# Patient Record
Sex: Female | Born: 1965 | Race: Black or African American | Hispanic: No | Marital: Married | Smoking: Former smoker
Health system: Southern US, Community
[De-identification: ages and names within clinical notes are randomized; demographics above are authoritative.]

## PROBLEM LIST (undated history)

## (undated) DIAGNOSIS — M2240 Chondromalacia patellae, unspecified knee: Secondary | ICD-10-CM

## (undated) DIAGNOSIS — F17200 Nicotine dependence, unspecified, uncomplicated: Secondary | ICD-10-CM

## (undated) DIAGNOSIS — F329 Major depressive disorder, single episode, unspecified: Secondary | ICD-10-CM

## (undated) DIAGNOSIS — F410 Panic disorder [episodic paroxysmal anxiety] without agoraphobia: Secondary | ICD-10-CM

## (undated) DIAGNOSIS — F32A Depression, unspecified: Secondary | ICD-10-CM

## (undated) DIAGNOSIS — L2089 Other atopic dermatitis: Secondary | ICD-10-CM

## (undated) DIAGNOSIS — F319 Bipolar disorder, unspecified: Secondary | ICD-10-CM

## (undated) DIAGNOSIS — N83209 Unspecified ovarian cyst, unspecified side: Secondary | ICD-10-CM

## (undated) DIAGNOSIS — M545 Low back pain: Secondary | ICD-10-CM

## (undated) DIAGNOSIS — F419 Anxiety disorder, unspecified: Secondary | ICD-10-CM

## (undated) DIAGNOSIS — I8390 Asymptomatic varicose veins of unspecified lower extremity: Secondary | ICD-10-CM

## (undated) DIAGNOSIS — M6750 Plica syndrome, unspecified knee: Secondary | ICD-10-CM

## (undated) DIAGNOSIS — M1990 Unspecified osteoarthritis, unspecified site: Secondary | ICD-10-CM

## (undated) DIAGNOSIS — K219 Gastro-esophageal reflux disease without esophagitis: Secondary | ICD-10-CM

## (undated) HISTORY — DX: Low back pain: M54.5

## (undated) HISTORY — DX: Nicotine dependence, unspecified, uncomplicated: F17.200

## (undated) HISTORY — DX: Unspecified ovarian cyst, unspecified side: N83.209

## (undated) HISTORY — DX: Unspecified osteoarthritis, unspecified site: M19.90

## (undated) HISTORY — DX: Chondromalacia patellae, unspecified knee: M22.40

## (undated) HISTORY — DX: Plica syndrome, unspecified knee: M67.50

## (undated) HISTORY — DX: Panic disorder (episodic paroxysmal anxiety): F41.0

## (undated) HISTORY — DX: Asymptomatic varicose veins of unspecified lower extremity: I83.90

## (undated) HISTORY — DX: Gastro-esophageal reflux disease without esophagitis: K21.9

## (undated) HISTORY — DX: Other atopic dermatitis: L20.89

## (undated) NOTE — *Deleted (*Deleted)
It was nice meeting you today!    If you have any questions or concerns, please feel free to call the clinic.   Be well,  Dana Allan, MD [facility]

---

## 1997-10-18 ENCOUNTER — Encounter: Admission: RE | Admit: 1997-10-18 | Discharge: 1997-10-18 | Payer: Self-pay | Admitting: Sports Medicine

## 1997-10-19 ENCOUNTER — Encounter: Admission: RE | Admit: 1997-10-19 | Discharge: 1997-10-19 | Payer: Self-pay | Admitting: Family Medicine

## 1997-10-24 ENCOUNTER — Ambulatory Visit (HOSPITAL_COMMUNITY): Admission: RE | Admit: 1997-10-24 | Discharge: 1997-10-24 | Payer: Self-pay | Admitting: Sports Medicine

## 1997-10-24 ENCOUNTER — Encounter: Admission: RE | Admit: 1997-10-24 | Discharge: 1997-10-24 | Payer: Self-pay | Admitting: Family Medicine

## 1997-10-26 ENCOUNTER — Encounter: Admission: RE | Admit: 1997-10-26 | Discharge: 1997-10-26 | Payer: Self-pay | Admitting: Family Medicine

## 1997-10-28 ENCOUNTER — Ambulatory Visit (HOSPITAL_COMMUNITY): Admission: RE | Admit: 1997-10-28 | Discharge: 1997-10-28 | Payer: Self-pay | Admitting: *Deleted

## 1997-10-28 ENCOUNTER — Encounter: Admission: RE | Admit: 1997-10-28 | Discharge: 1997-10-28 | Payer: Self-pay | Admitting: Family Medicine

## 1997-11-09 ENCOUNTER — Encounter: Admission: RE | Admit: 1997-11-09 | Discharge: 1997-11-09 | Payer: Self-pay | Admitting: Family Medicine

## 1997-11-15 ENCOUNTER — Encounter: Admission: RE | Admit: 1997-11-15 | Discharge: 1997-11-15 | Payer: Self-pay | Admitting: Sports Medicine

## 1997-11-17 ENCOUNTER — Ambulatory Visit (HOSPITAL_COMMUNITY): Admission: RE | Admit: 1997-11-17 | Discharge: 1997-11-17 | Payer: Self-pay | Admitting: Vascular Surgery

## 1997-12-22 ENCOUNTER — Ambulatory Visit (HOSPITAL_COMMUNITY): Admission: RE | Admit: 1997-12-22 | Discharge: 1997-12-22 | Payer: Self-pay | Admitting: Vascular Surgery

## 1997-12-22 ENCOUNTER — Encounter: Payer: Self-pay | Admitting: Vascular Surgery

## 1997-12-28 ENCOUNTER — Encounter: Admission: RE | Admit: 1997-12-28 | Discharge: 1997-12-28 | Payer: Self-pay | Admitting: Family Medicine

## 1998-01-03 ENCOUNTER — Encounter: Admission: RE | Admit: 1998-01-03 | Discharge: 1998-01-03 | Payer: Self-pay | Admitting: Family Medicine

## 1998-01-14 ENCOUNTER — Inpatient Hospital Stay (HOSPITAL_COMMUNITY): Admission: AD | Admit: 1998-01-14 | Discharge: 1998-01-14 | Payer: Self-pay | Admitting: *Deleted

## 1998-01-19 ENCOUNTER — Other Ambulatory Visit: Admission: RE | Admit: 1998-01-19 | Discharge: 1998-01-19 | Payer: Self-pay

## 1998-01-27 ENCOUNTER — Ambulatory Visit (HOSPITAL_COMMUNITY): Admission: RE | Admit: 1998-01-27 | Discharge: 1998-01-27 | Payer: Self-pay

## 1998-02-23 ENCOUNTER — Encounter: Admission: RE | Admit: 1998-02-23 | Discharge: 1998-02-23 | Payer: Self-pay | Admitting: Obstetrics

## 1998-03-08 ENCOUNTER — Encounter: Admission: RE | Admit: 1998-03-08 | Discharge: 1998-03-08 | Payer: Self-pay | Admitting: Obstetrics & Gynecology

## 1998-04-05 ENCOUNTER — Encounter: Admission: RE | Admit: 1998-04-05 | Discharge: 1998-04-05 | Payer: Self-pay | Admitting: Obstetrics & Gynecology

## 1998-04-07 ENCOUNTER — Encounter: Admission: RE | Admit: 1998-04-07 | Discharge: 1998-04-07 | Payer: Self-pay | Admitting: Family Medicine

## 1998-04-17 ENCOUNTER — Ambulatory Visit (HOSPITAL_COMMUNITY): Admission: RE | Admit: 1998-04-17 | Discharge: 1998-04-17 | Payer: Self-pay | Admitting: Obstetrics

## 1998-05-17 ENCOUNTER — Encounter: Admission: RE | Admit: 1998-05-17 | Discharge: 1998-05-17 | Payer: Self-pay | Admitting: Obstetrics & Gynecology

## 1998-05-31 ENCOUNTER — Encounter: Admission: RE | Admit: 1998-05-31 | Discharge: 1998-05-31 | Payer: Self-pay | Admitting: Obstetrics & Gynecology

## 1998-06-13 ENCOUNTER — Inpatient Hospital Stay (HOSPITAL_COMMUNITY): Admission: RE | Admit: 1998-06-13 | Discharge: 1998-06-16 | Payer: Self-pay | Admitting: *Deleted

## 1998-06-19 ENCOUNTER — Encounter (HOSPITAL_COMMUNITY): Admission: RE | Admit: 1998-06-19 | Discharge: 1998-07-31 | Payer: Self-pay | Admitting: Obstetrics & Gynecology

## 1998-06-21 ENCOUNTER — Encounter: Admission: RE | Admit: 1998-06-21 | Discharge: 1998-06-21 | Payer: Self-pay | Admitting: Obstetrics & Gynecology

## 1998-06-21 ENCOUNTER — Inpatient Hospital Stay (HOSPITAL_COMMUNITY): Admission: AD | Admit: 1998-06-21 | Discharge: 1998-06-21 | Payer: Self-pay | Admitting: Obstetrics

## 1998-06-29 ENCOUNTER — Encounter: Admission: RE | Admit: 1998-06-29 | Discharge: 1998-06-29 | Payer: Self-pay | Admitting: Obstetrics

## 1998-07-05 ENCOUNTER — Encounter: Admission: RE | Admit: 1998-07-05 | Discharge: 1998-07-05 | Payer: Self-pay | Admitting: Obstetrics & Gynecology

## 1998-07-12 ENCOUNTER — Encounter: Admission: RE | Admit: 1998-07-12 | Discharge: 1998-07-12 | Payer: Self-pay | Admitting: Obstetrics & Gynecology

## 1998-07-19 ENCOUNTER — Encounter: Admission: RE | Admit: 1998-07-19 | Discharge: 1998-07-19 | Payer: Self-pay | Admitting: Obstetrics & Gynecology

## 1998-07-26 ENCOUNTER — Encounter: Admission: RE | Admit: 1998-07-26 | Discharge: 1998-07-26 | Payer: Self-pay | Admitting: Obstetrics & Gynecology

## 1998-07-30 ENCOUNTER — Inpatient Hospital Stay (HOSPITAL_COMMUNITY): Admission: AD | Admit: 1998-07-30 | Discharge: 1998-08-01 | Payer: Self-pay | Admitting: Obstetrics & Gynecology

## 1998-09-12 ENCOUNTER — Encounter: Admission: RE | Admit: 1998-09-12 | Discharge: 1998-09-12 | Payer: Self-pay | Admitting: Family Medicine

## 1998-09-12 ENCOUNTER — Other Ambulatory Visit: Admission: RE | Admit: 1998-09-12 | Discharge: 1998-09-12 | Payer: Self-pay

## 1998-11-29 ENCOUNTER — Encounter: Admission: RE | Admit: 1998-11-29 | Discharge: 1998-11-29 | Payer: Self-pay | Admitting: Family Medicine

## 2000-08-01 ENCOUNTER — Other Ambulatory Visit: Admission: RE | Admit: 2000-08-01 | Discharge: 2000-08-01 | Payer: Self-pay | Admitting: Obstetrics & Gynecology

## 2000-08-01 ENCOUNTER — Encounter: Admission: RE | Admit: 2000-08-01 | Discharge: 2000-08-01 | Payer: Self-pay | Admitting: Family Medicine

## 2001-04-02 ENCOUNTER — Encounter: Admission: RE | Admit: 2001-04-02 | Discharge: 2001-04-02 | Payer: Self-pay | Admitting: Family Medicine

## 2001-04-06 ENCOUNTER — Encounter: Admission: RE | Admit: 2001-04-06 | Discharge: 2001-04-06 | Payer: Self-pay | Admitting: Sports Medicine

## 2001-05-26 ENCOUNTER — Emergency Department (HOSPITAL_COMMUNITY): Admission: EM | Admit: 2001-05-26 | Discharge: 2001-05-26 | Payer: Self-pay | Admitting: Emergency Medicine

## 2002-01-28 ENCOUNTER — Emergency Department (HOSPITAL_COMMUNITY): Admission: EM | Admit: 2002-01-28 | Discharge: 2002-01-29 | Payer: Self-pay | Admitting: Emergency Medicine

## 2002-01-29 ENCOUNTER — Encounter: Payer: Self-pay | Admitting: Emergency Medicine

## 2002-09-09 ENCOUNTER — Emergency Department (HOSPITAL_COMMUNITY): Admission: EM | Admit: 2002-09-09 | Discharge: 2002-09-09 | Payer: Self-pay

## 2002-09-13 ENCOUNTER — Emergency Department (HOSPITAL_COMMUNITY): Admission: EM | Admit: 2002-09-13 | Discharge: 2002-09-13 | Payer: Self-pay | Admitting: Emergency Medicine

## 2002-09-13 ENCOUNTER — Encounter: Payer: Self-pay | Admitting: Orthopedic Surgery

## 2002-09-14 ENCOUNTER — Encounter (HOSPITAL_BASED_OUTPATIENT_CLINIC_OR_DEPARTMENT_OTHER): Admission: RE | Admit: 2002-09-14 | Discharge: 2002-12-13 | Payer: Self-pay | Admitting: Orthopedic Surgery

## 2003-01-11 ENCOUNTER — Inpatient Hospital Stay (HOSPITAL_COMMUNITY): Admission: AD | Admit: 2003-01-11 | Discharge: 2003-01-11 | Payer: Self-pay | Admitting: *Deleted

## 2003-08-08 ENCOUNTER — Inpatient Hospital Stay (HOSPITAL_COMMUNITY): Admission: AD | Admit: 2003-08-08 | Discharge: 2003-08-09 | Payer: Self-pay | Admitting: Obstetrics & Gynecology

## 2003-09-08 ENCOUNTER — Emergency Department (HOSPITAL_COMMUNITY): Admission: EM | Admit: 2003-09-08 | Discharge: 2003-09-08 | Payer: Self-pay | Admitting: Family Medicine

## 2004-01-01 ENCOUNTER — Emergency Department (HOSPITAL_COMMUNITY): Admission: EM | Admit: 2004-01-01 | Discharge: 2004-01-01 | Payer: Self-pay | Admitting: Family Medicine

## 2004-04-23 ENCOUNTER — Emergency Department (HOSPITAL_COMMUNITY): Admission: EM | Admit: 2004-04-23 | Discharge: 2004-04-23 | Payer: Self-pay | Admitting: Family Medicine

## 2004-04-30 ENCOUNTER — Emergency Department (HOSPITAL_COMMUNITY): Admission: EM | Admit: 2004-04-30 | Discharge: 2004-04-30 | Payer: Self-pay | Admitting: Family Medicine

## 2004-10-15 ENCOUNTER — Emergency Department (HOSPITAL_COMMUNITY): Admission: EM | Admit: 2004-10-15 | Discharge: 2004-10-15 | Payer: Self-pay | Admitting: Emergency Medicine

## 2004-10-17 ENCOUNTER — Ambulatory Visit: Payer: Self-pay | Admitting: Family Medicine

## 2005-02-07 ENCOUNTER — Emergency Department (HOSPITAL_COMMUNITY): Admission: EM | Admit: 2005-02-07 | Discharge: 2005-02-07 | Payer: Self-pay | Admitting: Emergency Medicine

## 2005-03-25 ENCOUNTER — Emergency Department (HOSPITAL_COMMUNITY): Admission: EM | Admit: 2005-03-25 | Discharge: 2005-03-25 | Payer: Self-pay | Admitting: Family Medicine

## 2006-03-05 ENCOUNTER — Emergency Department (HOSPITAL_COMMUNITY): Admission: EM | Admit: 2006-03-05 | Discharge: 2006-03-05 | Payer: Self-pay | Admitting: *Deleted

## 2006-03-10 ENCOUNTER — Emergency Department (HOSPITAL_COMMUNITY): Admission: EM | Admit: 2006-03-10 | Discharge: 2006-03-10 | Payer: Self-pay | Admitting: Emergency Medicine

## 2006-03-31 ENCOUNTER — Emergency Department (HOSPITAL_COMMUNITY): Admission: EM | Admit: 2006-03-31 | Discharge: 2006-03-31 | Payer: Self-pay | Admitting: Emergency Medicine

## 2006-04-02 ENCOUNTER — Emergency Department (HOSPITAL_COMMUNITY): Admission: EM | Admit: 2006-04-02 | Discharge: 2006-04-02 | Payer: Self-pay | Admitting: Family Medicine

## 2006-05-14 ENCOUNTER — Emergency Department (HOSPITAL_COMMUNITY): Admission: EM | Admit: 2006-05-14 | Discharge: 2006-05-14 | Payer: Self-pay | Admitting: Family Medicine

## 2006-06-27 ENCOUNTER — Telehealth (INDEPENDENT_AMBULATORY_CARE_PROVIDER_SITE_OTHER): Payer: Self-pay | Admitting: *Deleted

## 2006-06-30 ENCOUNTER — Ambulatory Visit: Payer: Self-pay | Admitting: Sports Medicine

## 2006-06-30 ENCOUNTER — Encounter (INDEPENDENT_AMBULATORY_CARE_PROVIDER_SITE_OTHER): Payer: Self-pay | Admitting: *Deleted

## 2006-06-30 ENCOUNTER — Other Ambulatory Visit: Admission: RE | Admit: 2006-06-30 | Discharge: 2006-06-30 | Payer: Self-pay | Admitting: Family Medicine

## 2006-06-30 ENCOUNTER — Telehealth (INDEPENDENT_AMBULATORY_CARE_PROVIDER_SITE_OTHER): Payer: Self-pay | Admitting: *Deleted

## 2006-06-30 DIAGNOSIS — G4700 Insomnia, unspecified: Secondary | ICD-10-CM | POA: Insufficient documentation

## 2006-06-30 DIAGNOSIS — I8390 Asymptomatic varicose veins of unspecified lower extremity: Secondary | ICD-10-CM

## 2006-06-30 DIAGNOSIS — M545 Low back pain, unspecified: Secondary | ICD-10-CM

## 2006-06-30 HISTORY — DX: Asymptomatic varicose veins of unspecified lower extremity: I83.90

## 2006-06-30 HISTORY — DX: Low back pain, unspecified: M54.50

## 2006-06-30 LAB — CONVERTED CEMR LAB
Basophils Absolute: 0.2 10*3/uL
Granulocyte count absolute: 3 10*3/uL
Hemoglobin: 13.4 g/dL
Lymphocytes Relative: 49.1 %
Lymphs Abs: 3.2 10*3/uL
MCV: 87.4 fL
Monocytes Absolute: 0.3 10*3/uL
Prolactin: 4.3 ng/mL

## 2006-07-01 ENCOUNTER — Encounter: Admission: RE | Admit: 2006-07-01 | Discharge: 2006-07-01 | Payer: Self-pay | Admitting: Sports Medicine

## 2006-07-02 ENCOUNTER — Telehealth: Payer: Self-pay | Admitting: Family Medicine

## 2006-07-03 ENCOUNTER — Encounter (INDEPENDENT_AMBULATORY_CARE_PROVIDER_SITE_OTHER): Payer: Self-pay | Admitting: *Deleted

## 2006-08-07 ENCOUNTER — Telehealth (INDEPENDENT_AMBULATORY_CARE_PROVIDER_SITE_OTHER): Payer: Self-pay | Admitting: *Deleted

## 2006-09-19 ENCOUNTER — Telehealth: Payer: Self-pay | Admitting: *Deleted

## 2006-09-22 ENCOUNTER — Telehealth (INDEPENDENT_AMBULATORY_CARE_PROVIDER_SITE_OTHER): Payer: Self-pay | Admitting: Family Medicine

## 2006-10-03 ENCOUNTER — Encounter (INDEPENDENT_AMBULATORY_CARE_PROVIDER_SITE_OTHER): Payer: Self-pay | Admitting: *Deleted

## 2006-10-20 ENCOUNTER — Telehealth: Payer: Self-pay | Admitting: *Deleted

## 2006-10-21 ENCOUNTER — Encounter: Payer: Self-pay | Admitting: *Deleted

## 2006-10-26 ENCOUNTER — Emergency Department (HOSPITAL_COMMUNITY): Admission: EM | Admit: 2006-10-26 | Discharge: 2006-10-26 | Payer: Self-pay | Admitting: Emergency Medicine

## 2006-10-29 ENCOUNTER — Encounter (INDEPENDENT_AMBULATORY_CARE_PROVIDER_SITE_OTHER): Payer: Self-pay | Admitting: *Deleted

## 2006-10-29 ENCOUNTER — Ambulatory Visit: Payer: Self-pay | Admitting: Family Medicine

## 2006-10-29 ENCOUNTER — Telehealth: Payer: Self-pay | Admitting: *Deleted

## 2006-10-29 DIAGNOSIS — F319 Bipolar disorder, unspecified: Secondary | ICD-10-CM

## 2006-10-29 LAB — CONVERTED CEMR LAB
ALT: 9 units/L (ref 0–35)
AST: 13 units/L (ref 0–37)
Albumin: 4.3 g/dL (ref 3.5–5.2)
BUN: 9 mg/dL (ref 6–23)
Calcium: 9.2 mg/dL (ref 8.4–10.5)
Chloride: 104 meq/L (ref 96–112)
MCV: 90.2 fL (ref 78.0–100.0)
Potassium: 4.2 meq/L (ref 3.5–5.3)
Prothrombin Time: 13.2 s (ref 11.6–15.2)
RBC: 5.02 M/uL (ref 3.87–5.11)
Sodium: 141 meq/L (ref 135–145)
Total Protein: 7.5 g/dL (ref 6.0–8.3)
WBC: 5.9 10*3/uL (ref 4.0–10.5)

## 2006-11-04 ENCOUNTER — Telehealth (INDEPENDENT_AMBULATORY_CARE_PROVIDER_SITE_OTHER): Payer: Self-pay | Admitting: *Deleted

## 2006-11-06 ENCOUNTER — Encounter: Payer: Self-pay | Admitting: *Deleted

## 2006-11-07 ENCOUNTER — Telehealth: Payer: Self-pay | Admitting: *Deleted

## 2006-11-27 ENCOUNTER — Telehealth (INDEPENDENT_AMBULATORY_CARE_PROVIDER_SITE_OTHER): Payer: Self-pay | Admitting: *Deleted

## 2006-11-28 ENCOUNTER — Telehealth: Payer: Self-pay | Admitting: *Deleted

## 2006-12-01 ENCOUNTER — Telehealth (INDEPENDENT_AMBULATORY_CARE_PROVIDER_SITE_OTHER): Payer: Self-pay | Admitting: *Deleted

## 2006-12-04 ENCOUNTER — Telehealth: Payer: Self-pay | Admitting: *Deleted

## 2006-12-04 ENCOUNTER — Ambulatory Visit: Payer: Self-pay

## 2006-12-08 ENCOUNTER — Telehealth: Payer: Self-pay | Admitting: *Deleted

## 2006-12-10 ENCOUNTER — Encounter: Payer: Self-pay | Admitting: *Deleted

## 2006-12-10 ENCOUNTER — Telehealth: Payer: Self-pay | Admitting: *Deleted

## 2006-12-22 ENCOUNTER — Telehealth (INDEPENDENT_AMBULATORY_CARE_PROVIDER_SITE_OTHER): Payer: Self-pay | Admitting: *Deleted

## 2007-01-07 ENCOUNTER — Encounter (INDEPENDENT_AMBULATORY_CARE_PROVIDER_SITE_OTHER): Payer: Self-pay | Admitting: *Deleted

## 2007-01-15 ENCOUNTER — Telehealth: Payer: Self-pay | Admitting: *Deleted

## 2007-01-16 ENCOUNTER — Telehealth (INDEPENDENT_AMBULATORY_CARE_PROVIDER_SITE_OTHER): Payer: Self-pay | Admitting: *Deleted

## 2007-01-16 ENCOUNTER — Encounter (INDEPENDENT_AMBULATORY_CARE_PROVIDER_SITE_OTHER): Payer: Self-pay | Admitting: *Deleted

## 2007-01-16 ENCOUNTER — Ambulatory Visit: Payer: Self-pay | Admitting: Family Medicine

## 2007-01-28 ENCOUNTER — Ambulatory Visit (HOSPITAL_COMMUNITY): Admission: RE | Admit: 2007-01-28 | Discharge: 2007-01-28 | Payer: Self-pay | Admitting: Obstetrics & Gynecology

## 2007-01-28 ENCOUNTER — Encounter (INDEPENDENT_AMBULATORY_CARE_PROVIDER_SITE_OTHER): Payer: Self-pay | Admitting: *Deleted

## 2007-02-10 ENCOUNTER — Ambulatory Visit: Payer: Self-pay | Admitting: Family Medicine

## 2007-02-10 DIAGNOSIS — F411 Generalized anxiety disorder: Secondary | ICD-10-CM | POA: Insufficient documentation

## 2007-02-10 HISTORY — DX: Generalized anxiety disorder: F41.1

## 2007-02-26 ENCOUNTER — Encounter (INDEPENDENT_AMBULATORY_CARE_PROVIDER_SITE_OTHER): Payer: Self-pay | Admitting: *Deleted

## 2007-03-11 ENCOUNTER — Encounter: Payer: Self-pay | Admitting: *Deleted

## 2007-04-06 ENCOUNTER — Encounter: Payer: Self-pay | Admitting: *Deleted

## 2007-05-05 ENCOUNTER — Telehealth (INDEPENDENT_AMBULATORY_CARE_PROVIDER_SITE_OTHER): Payer: Self-pay | Admitting: *Deleted

## 2007-05-12 ENCOUNTER — Ambulatory Visit: Payer: Self-pay | Admitting: Family Medicine

## 2007-05-21 ENCOUNTER — Encounter: Admission: RE | Admit: 2007-05-21 | Discharge: 2007-05-21 | Payer: Self-pay | Admitting: *Deleted

## 2007-05-22 ENCOUNTER — Telehealth (INDEPENDENT_AMBULATORY_CARE_PROVIDER_SITE_OTHER): Payer: Self-pay | Admitting: *Deleted

## 2007-05-27 ENCOUNTER — Encounter (INDEPENDENT_AMBULATORY_CARE_PROVIDER_SITE_OTHER): Payer: Self-pay | Admitting: *Deleted

## 2007-05-27 ENCOUNTER — Ambulatory Visit: Payer: Self-pay | Admitting: Family Medicine

## 2007-05-27 LAB — CONVERTED CEMR LAB
ALT: 12 units/L (ref 0–35)
AST: 14 units/L (ref 0–37)
Alkaline Phosphatase: 55 units/L (ref 39–117)
BUN: 10 mg/dL (ref 6–23)
Creatinine, Ser: 0.99 mg/dL (ref 0.40–1.20)
INR: 1 (ref 0.0–1.5)
MCHC: 31.5 g/dL (ref 30.0–36.0)
Platelets: 319 10*3/uL (ref 150–400)
Prothrombin Time: 13.3 s (ref 11.6–15.2)
RBC: 4.9 M/uL (ref 3.87–5.11)
RDW: 15.2 % (ref 11.5–15.5)
Total Bilirubin: 0.4 mg/dL (ref 0.3–1.2)

## 2007-06-05 ENCOUNTER — Telehealth: Payer: Self-pay | Admitting: *Deleted

## 2007-06-17 ENCOUNTER — Telehealth (INDEPENDENT_AMBULATORY_CARE_PROVIDER_SITE_OTHER): Payer: Self-pay | Admitting: *Deleted

## 2007-07-02 ENCOUNTER — Telehealth: Payer: Self-pay | Admitting: *Deleted

## 2007-07-14 ENCOUNTER — Ambulatory Visit: Payer: Self-pay | Admitting: Family Medicine

## 2007-07-14 LAB — CONVERTED CEMR LAB
Bilirubin Urine: NEGATIVE
Ketones, urine, test strip: NEGATIVE
Nitrite: POSITIVE
Specific Gravity, Urine: 1.02
Urobilinogen, UA: 1

## 2007-07-20 ENCOUNTER — Encounter (INDEPENDENT_AMBULATORY_CARE_PROVIDER_SITE_OTHER): Payer: Self-pay | Admitting: *Deleted

## 2007-07-20 ENCOUNTER — Other Ambulatory Visit: Admission: RE | Admit: 2007-07-20 | Discharge: 2007-07-20 | Payer: Self-pay | Admitting: Family Medicine

## 2007-07-20 ENCOUNTER — Ambulatory Visit: Payer: Self-pay | Admitting: Family Medicine

## 2007-07-20 LAB — CONVERTED CEMR LAB
Bilirubin Urine: NEGATIVE
CO2: 25 meq/L (ref 19–32)
Calcium: 9.8 mg/dL (ref 8.4–10.5)
Chlamydia, DNA Probe: NEGATIVE
Creatinine, Ser: 1.19 mg/dL (ref 0.40–1.20)
GC Probe Amp, Genital: NEGATIVE
Glucose, Urine, Semiquant: NEGATIVE
Sodium: 140 meq/L (ref 135–145)
Urobilinogen, UA: 0.2

## 2007-07-21 ENCOUNTER — Telehealth (INDEPENDENT_AMBULATORY_CARE_PROVIDER_SITE_OTHER): Payer: Self-pay | Admitting: *Deleted

## 2007-07-21 ENCOUNTER — Encounter (INDEPENDENT_AMBULATORY_CARE_PROVIDER_SITE_OTHER): Payer: Self-pay | Admitting: *Deleted

## 2007-07-21 ENCOUNTER — Telehealth: Payer: Self-pay | Admitting: *Deleted

## 2007-07-26 ENCOUNTER — Encounter (INDEPENDENT_AMBULATORY_CARE_PROVIDER_SITE_OTHER): Payer: Self-pay | Admitting: *Deleted

## 2007-08-12 ENCOUNTER — Ambulatory Visit: Payer: Self-pay | Admitting: Family Medicine

## 2007-08-12 ENCOUNTER — Encounter (INDEPENDENT_AMBULATORY_CARE_PROVIDER_SITE_OTHER): Payer: Self-pay | Admitting: *Deleted

## 2007-08-12 DIAGNOSIS — N83209 Unspecified ovarian cyst, unspecified side: Secondary | ICD-10-CM

## 2007-08-12 HISTORY — DX: Unspecified ovarian cyst, unspecified side: N83.209

## 2007-08-20 ENCOUNTER — Telehealth: Payer: Self-pay | Admitting: *Deleted

## 2007-08-24 ENCOUNTER — Encounter: Admission: RE | Admit: 2007-08-24 | Discharge: 2007-08-24 | Payer: Self-pay | Admitting: *Deleted

## 2007-08-25 ENCOUNTER — Ambulatory Visit: Payer: Self-pay | Admitting: Family Medicine

## 2007-08-25 DIAGNOSIS — L2089 Other atopic dermatitis: Secondary | ICD-10-CM

## 2007-08-25 HISTORY — DX: Other atopic dermatitis: L20.89

## 2007-08-30 ENCOUNTER — Encounter (INDEPENDENT_AMBULATORY_CARE_PROVIDER_SITE_OTHER): Payer: Self-pay | Admitting: *Deleted

## 2007-09-02 ENCOUNTER — Encounter (INDEPENDENT_AMBULATORY_CARE_PROVIDER_SITE_OTHER): Payer: Self-pay | Admitting: *Deleted

## 2007-09-08 ENCOUNTER — Ambulatory Visit: Payer: Self-pay | Admitting: Family Medicine

## 2007-09-09 ENCOUNTER — Telehealth: Payer: Self-pay | Admitting: *Deleted

## 2007-09-15 ENCOUNTER — Telehealth: Payer: Self-pay | Admitting: *Deleted

## 2007-09-17 ENCOUNTER — Telehealth: Payer: Self-pay | Admitting: *Deleted

## 2007-10-08 ENCOUNTER — Telehealth: Payer: Self-pay | Admitting: *Deleted

## 2007-10-21 ENCOUNTER — Encounter (INDEPENDENT_AMBULATORY_CARE_PROVIDER_SITE_OTHER): Payer: Self-pay | Admitting: *Deleted

## 2007-11-02 ENCOUNTER — Telehealth: Payer: Self-pay | Admitting: *Deleted

## 2007-11-03 ENCOUNTER — Ambulatory Visit: Payer: Self-pay | Admitting: Family Medicine

## 2007-11-03 DIAGNOSIS — N926 Irregular menstruation, unspecified: Secondary | ICD-10-CM | POA: Insufficient documentation

## 2007-11-03 HISTORY — DX: Irregular menstruation, unspecified: N92.6

## 2007-11-03 LAB — CONVERTED CEMR LAB: Beta hcg, urine, semiquantitative: NEGATIVE

## 2007-11-06 ENCOUNTER — Telehealth: Payer: Self-pay | Admitting: *Deleted

## 2007-11-24 ENCOUNTER — Encounter (INDEPENDENT_AMBULATORY_CARE_PROVIDER_SITE_OTHER): Payer: Self-pay | Admitting: *Deleted

## 2007-11-24 ENCOUNTER — Telehealth: Payer: Self-pay | Admitting: *Deleted

## 2007-12-01 ENCOUNTER — Telehealth: Payer: Self-pay | Admitting: Family Medicine

## 2007-12-01 ENCOUNTER — Encounter: Payer: Self-pay | Admitting: Family Medicine

## 2007-12-01 ENCOUNTER — Encounter: Admission: RE | Admit: 2007-12-01 | Discharge: 2007-12-01 | Payer: Self-pay | Admitting: Family Medicine

## 2007-12-01 ENCOUNTER — Ambulatory Visit: Payer: Self-pay | Admitting: Family Medicine

## 2007-12-01 LAB — CONVERTED CEMR LAB
Bilirubin Urine: NEGATIVE
Calcium: 9.1 mg/dL (ref 8.4–10.5)
Glucose, Bld: 93 mg/dL (ref 70–99)
Glucose, Urine, Semiquant: NEGATIVE
Ketones, urine, test strip: NEGATIVE
Potassium: 4.1 meq/L (ref 3.5–5.3)
Sodium: 141 meq/L (ref 135–145)
Specific Gravity, Urine: 1.02
Urobilinogen, UA: 1
pH: 7

## 2007-12-02 ENCOUNTER — Telehealth: Payer: Self-pay | Admitting: *Deleted

## 2007-12-15 ENCOUNTER — Telehealth (INDEPENDENT_AMBULATORY_CARE_PROVIDER_SITE_OTHER): Payer: Self-pay | Admitting: Family Medicine

## 2007-12-23 ENCOUNTER — Encounter: Payer: Self-pay | Admitting: Family Medicine

## 2007-12-30 ENCOUNTER — Telehealth: Payer: Self-pay | Admitting: Family Medicine

## 2007-12-30 ENCOUNTER — Encounter: Payer: Self-pay | Admitting: Family Medicine

## 2008-01-19 ENCOUNTER — Telehealth: Payer: Self-pay | Admitting: *Deleted

## 2008-02-12 ENCOUNTER — Telehealth: Payer: Self-pay | Admitting: *Deleted

## 2008-03-28 ENCOUNTER — Telehealth: Payer: Self-pay | Admitting: Family Medicine

## 2008-03-29 ENCOUNTER — Telehealth: Payer: Self-pay | Admitting: *Deleted

## 2008-04-01 ENCOUNTER — Ambulatory Visit: Payer: Self-pay | Admitting: Family Medicine

## 2008-04-01 DIAGNOSIS — M25569 Pain in unspecified knee: Secondary | ICD-10-CM

## 2008-04-01 DIAGNOSIS — G8929 Other chronic pain: Secondary | ICD-10-CM | POA: Insufficient documentation

## 2008-04-06 ENCOUNTER — Telehealth: Payer: Self-pay | Admitting: *Deleted

## 2008-04-07 ENCOUNTER — Ambulatory Visit: Payer: Self-pay | Admitting: Family Medicine

## 2008-04-07 ENCOUNTER — Ambulatory Visit: Payer: Self-pay

## 2008-04-12 ENCOUNTER — Telehealth: Payer: Self-pay | Admitting: *Deleted

## 2008-04-13 DIAGNOSIS — F17200 Nicotine dependence, unspecified, uncomplicated: Secondary | ICD-10-CM

## 2008-04-13 HISTORY — DX: Nicotine dependence, unspecified, uncomplicated: F17.200

## 2008-05-17 ENCOUNTER — Telehealth: Payer: Self-pay | Admitting: Family Medicine

## 2008-05-18 ENCOUNTER — Telehealth: Payer: Self-pay | Admitting: Family Medicine

## 2008-05-30 ENCOUNTER — Telehealth (INDEPENDENT_AMBULATORY_CARE_PROVIDER_SITE_OTHER): Payer: Self-pay | Admitting: *Deleted

## 2008-06-08 ENCOUNTER — Telehealth: Payer: Self-pay | Admitting: Family Medicine

## 2008-06-08 ENCOUNTER — Ambulatory Visit: Payer: Self-pay | Admitting: Family Medicine

## 2008-06-08 LAB — CONVERTED CEMR LAB
Bilirubin Urine: NEGATIVE
Glucose, Urine, Semiquant: NEGATIVE
Protein, U semiquant: 30
Specific Gravity, Urine: >=1.03
WBC Urine, dipstick: NEGATIVE
pH: 6

## 2008-06-14 ENCOUNTER — Telehealth: Payer: Self-pay | Admitting: *Deleted

## 2008-06-15 ENCOUNTER — Telehealth: Payer: Self-pay | Admitting: Family Medicine

## 2008-06-17 ENCOUNTER — Telehealth: Payer: Self-pay | Admitting: *Deleted

## 2008-06-20 ENCOUNTER — Telehealth: Payer: Self-pay | Admitting: *Deleted

## 2008-06-20 ENCOUNTER — Telehealth (INDEPENDENT_AMBULATORY_CARE_PROVIDER_SITE_OTHER): Payer: Self-pay | Admitting: *Deleted

## 2008-06-21 ENCOUNTER — Telehealth (INDEPENDENT_AMBULATORY_CARE_PROVIDER_SITE_OTHER): Payer: Self-pay | Admitting: *Deleted

## 2008-06-28 ENCOUNTER — Telehealth: Payer: Self-pay | Admitting: *Deleted

## 2008-07-26 ENCOUNTER — Telehealth: Payer: Self-pay | Admitting: Family Medicine

## 2008-08-09 ENCOUNTER — Telehealth: Payer: Self-pay | Admitting: *Deleted

## 2008-09-23 ENCOUNTER — Telehealth: Payer: Self-pay | Admitting: Family Medicine

## 2008-09-30 ENCOUNTER — Telehealth: Payer: Self-pay | Admitting: Family Medicine

## 2008-10-03 ENCOUNTER — Ambulatory Visit: Payer: Self-pay | Admitting: Family Medicine

## 2008-10-03 DIAGNOSIS — M25529 Pain in unspecified elbow: Secondary | ICD-10-CM | POA: Insufficient documentation

## 2008-10-04 ENCOUNTER — Encounter: Payer: Self-pay | Admitting: Family Medicine

## 2008-10-11 ENCOUNTER — Ambulatory Visit: Payer: Self-pay | Admitting: Family Medicine

## 2008-10-19 ENCOUNTER — Telehealth: Payer: Self-pay | Admitting: *Deleted

## 2008-12-05 ENCOUNTER — Encounter: Payer: Self-pay | Admitting: Family Medicine

## 2008-12-14 ENCOUNTER — Telehealth: Payer: Self-pay | Admitting: Family Medicine

## 2008-12-16 ENCOUNTER — Ambulatory Visit: Payer: Self-pay | Admitting: Family Medicine

## 2008-12-16 ENCOUNTER — Telehealth: Payer: Self-pay | Admitting: Family Medicine

## 2009-01-03 ENCOUNTER — Telehealth: Payer: Self-pay | Admitting: Family Medicine

## 2009-01-23 ENCOUNTER — Telehealth: Payer: Self-pay | Admitting: *Deleted

## 2009-01-25 ENCOUNTER — Ambulatory Visit: Payer: Self-pay | Admitting: Family Medicine

## 2009-01-31 ENCOUNTER — Encounter: Payer: Self-pay | Admitting: Family Medicine

## 2009-02-21 ENCOUNTER — Encounter: Payer: Self-pay | Admitting: Family Medicine

## 2009-02-23 ENCOUNTER — Telehealth (INDEPENDENT_AMBULATORY_CARE_PROVIDER_SITE_OTHER): Payer: Self-pay | Admitting: *Deleted

## 2009-02-23 ENCOUNTER — Telehealth: Payer: Self-pay | Admitting: Family Medicine

## 2009-03-10 ENCOUNTER — Ambulatory Visit: Payer: Self-pay | Admitting: Family Medicine

## 2009-03-21 ENCOUNTER — Telehealth: Payer: Self-pay | Admitting: Family Medicine

## 2009-03-28 ENCOUNTER — Telehealth: Payer: Self-pay | Admitting: *Deleted

## 2009-03-28 ENCOUNTER — Encounter: Payer: Self-pay | Admitting: *Deleted

## 2009-04-28 ENCOUNTER — Ambulatory Visit: Payer: Self-pay | Admitting: Family Medicine

## 2009-04-28 DIAGNOSIS — M25519 Pain in unspecified shoulder: Secondary | ICD-10-CM | POA: Insufficient documentation

## 2009-05-08 ENCOUNTER — Ambulatory Visit (HOSPITAL_COMMUNITY): Admission: RE | Admit: 2009-05-08 | Discharge: 2009-05-08 | Payer: Self-pay | Admitting: Family Medicine

## 2009-05-11 ENCOUNTER — Encounter: Payer: Self-pay | Admitting: Family Medicine

## 2009-05-11 ENCOUNTER — Telehealth: Payer: Self-pay | Admitting: Family Medicine

## 2009-06-16 ENCOUNTER — Encounter: Payer: Self-pay | Admitting: Family Medicine

## 2009-06-16 ENCOUNTER — Ambulatory Visit: Payer: Self-pay | Admitting: Family Medicine

## 2009-08-08 ENCOUNTER — Telehealth: Payer: Self-pay | Admitting: Family Medicine

## 2009-10-09 ENCOUNTER — Emergency Department (HOSPITAL_COMMUNITY): Admission: EM | Admit: 2009-10-09 | Discharge: 2009-10-09 | Payer: Self-pay | Admitting: Family Medicine

## 2009-10-27 ENCOUNTER — Emergency Department (HOSPITAL_COMMUNITY): Admission: EM | Admit: 2009-10-27 | Discharge: 2009-10-27 | Payer: Self-pay | Admitting: Emergency Medicine

## 2009-10-28 ENCOUNTER — Emergency Department (HOSPITAL_COMMUNITY): Admission: EM | Admit: 2009-10-28 | Discharge: 2009-10-28 | Payer: Self-pay | Admitting: Emergency Medicine

## 2009-12-19 ENCOUNTER — Encounter (INDEPENDENT_AMBULATORY_CARE_PROVIDER_SITE_OTHER): Payer: Self-pay | Admitting: *Deleted

## 2010-01-04 ENCOUNTER — Encounter (INDEPENDENT_AMBULATORY_CARE_PROVIDER_SITE_OTHER): Payer: Self-pay | Admitting: *Deleted

## 2010-02-05 ENCOUNTER — Telehealth: Payer: Self-pay | Admitting: Family Medicine

## 2010-02-15 ENCOUNTER — Emergency Department (HOSPITAL_COMMUNITY): Admission: EM | Admit: 2010-02-15 | Discharge: 2010-02-15 | Payer: Self-pay | Admitting: Emergency Medicine

## 2010-04-15 ENCOUNTER — Encounter: Payer: Self-pay | Admitting: *Deleted

## 2010-04-16 ENCOUNTER — Encounter: Payer: Self-pay | Admitting: Sports Medicine

## 2010-04-24 NOTE — Assessment & Plan Note (Signed)
Summary: CORT SHOT KNEE,MC   Vital Signs:  Patient profile:   45 year old female BP sitting:   134 / 92  Vitals Entered By: Lillia Pauls CMA (April 28, 2009 8:41 AM)  Primary Care Provider:  Lequita Asal  MD   History of Present Illness: 45 yo F here for f/u of R knee pain and with new R shoulder pain.  1. R knee pain: No acute injury. Last seen 03/10/09 for cortisone shot that helped mildly for up to a couple weeks only Has had x-rays of knee in 2009 showing very mild DJD only Reports catching and locking symptoms + swelling Sometimes gives out as well  2. R shoulder pain Larey Seat about a week ago onto the point of her right shoulder Pain mostly in right trapezius/supraspinatus from where she points Pain with overhead activity and at bedtime + swelling, + night pain  Medications Prior to Update: 1)  Zolpidem Tartrate 10 Mg Tabs (Zolpidem Tartrate) .... One Tab By Mouth At Bedtime As Needed For Trouble Sleeping 2)  Lamictal 25 Mg Tabs (Lamotrigine) .... One Tab By Mouth At Bedtime X 2 Weeks, Then Increase To One Tab By Mouth Two Times A Day. 3)  Alprazolam 1 Mg  Tabs (Alprazolam) .... One Half Tab To One Tab By Mouth Daily As Needed Anxiety 4)  Detrol La 4 Mg Cp24 (Tolterodine Tartrate) .Marland Kitchen.. 1 Tablet By Mouth Daily 5)  Nizoral 2 % Sham (Ketoconazole) .... Use As Directed  Dispense 1 Bottle 6)  Triamcinolone Acetonide 0.1 % Crea (Triamcinolone Acetonide) .... Apply Bid To Affected Area  Dispense 70 Gram (Or Largest Tube Available) 7)  Voltaren 1 % Gel (Diclofenac Sodium) .... Apply To Affected Area 1-2 Times A Day 8)  Flexeril 10 Mg  Tabs (Cyclobenzaprine Hcl) .... Two Times A Day As Needed Back Spasm 9)  Tylenol Arthritis Pain 650 Mg Cr-Tabs (Acetaminophen) .Marland Kitchen.. 1 Tablet Every 6 Hours Schedule For The Next 3 Days  Allergies (verified): No Known Drug Allergies  Physical Exam  General:  overweight-appearing.   Msk:  R Knee: Trace synovitis and crepitation. No  effusion. Mod TTP medial joint line.  No lat joint line TTP. ROM normal in flexion and extension and lower leg rotation. Ligaments with solid consistent endpoints including ACL, PCL, LCL, MCL. Positive mcmurrays medially. Patellar and quadriceps tendons unremarkable. Hamstring and quadriceps strength is normal.   R Shoulder: Inspection reveals no abnormalities, atrophy or asymmetry. Palpation with no tenderness over AC joint or bicipital groove.  + TTP within trapezius/supraspinatus with spasm. Resists abduction past 100 degrees passively.  Full flexion and ER/IR. Pain with resisted empty can, 4+/5 strength limited by pain. Positive hawkins Normal scapular function observed. + painful arc.  no drop arm sign.   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  Shot only helped for a couple weeks and has positive mcmurrays with medial joint line tenderness.  She would consider arthroscopic debridement as an option if meniscus tear seen on MRI so will proceed with MRI.  Will call her with result.  May need valium for study.  Meloxicam in meantime - cannot repeat shot until mid-march.  Her updated medication list for this problem includes:    Flexeril 10 Mg Tabs (Cyclobenzaprine hcl) .Marland Kitchen..Marland Kitchen Two times a day as needed back spasm    Tylenol Arthritis Pain 650 Mg Cr-tabs (Acetaminophen) .Marland Kitchen... 1 tablet every  6 hours schedule for the next 3 days    Meloxicam 15 Mg Tabs (Meloxicam) .Marland Kitchen... 1 tab by mouth daily with food for pain  Orders: MRI without Contrast (MRI w/o Contrast)  Problem # 2:  SHOULDER PAIN, RIGHT (ICD-719.41) Assessment: New 2/2 rotator cuff strain.  This has only been a week and spasm palpated in trapezius/supraspinatus.  continue with heat, voltaren gel, meloxicam.  If not improving over next 2-3 weeks will recheck, consider u/s.  Her updated medication list for this  problem includes:    Flexeril 10 Mg Tabs (Cyclobenzaprine hcl) .Marland Kitchen..Marland Kitchen Two times a day as needed back spasm    Tylenol Arthritis Pain 650 Mg Cr-tabs (Acetaminophen) .Marland Kitchen... 1 tablet every 6 hours schedule for the next 3 days    Meloxicam 15 Mg Tabs (Meloxicam) .Marland Kitchen... 1 tab by mouth daily with food for pain  Complete Medication List: 1)  Zolpidem Tartrate 10 Mg Tabs (Zolpidem tartrate) .... One tab by mouth at bedtime as needed for trouble sleeping 2)  Lamictal 25 Mg Tabs (Lamotrigine) .... One tab by mouth at bedtime x 2 weeks, then increase to one tab by mouth two times a day. 3)  Alprazolam 1 Mg Tabs (Alprazolam) .... One half tab to one tab by mouth daily as needed anxiety 4)  Detrol La 4 Mg Cp24 (Tolterodine tartrate) .Marland Kitchen.. 1 tablet by mouth daily 5)  Nizoral 2 % Sham (Ketoconazole) .... Use as directed  dispense 1 bottle 6)  Triamcinolone Acetonide 0.1 % Crea (Triamcinolone acetonide) .... Apply bid to affected area  dispense 70 gram (or largest tube available) 7)  Voltaren 1 % Gel (Diclofenac sodium) .... Apply to affected area 1-2 times a day 8)  Flexeril 10 Mg Tabs (Cyclobenzaprine hcl) .... Two times a day as needed back spasm 9)  Tylenol Arthritis Pain 650 Mg Cr-tabs (Acetaminophen) .Marland Kitchen.. 1 tablet every 6 hours schedule for the next 3 days 10)  Meloxicam 15 Mg Tabs (Meloxicam) .Marland Kitchen.. 1 tab by mouth daily with food for pain  Patient Instructions: 1)  Appt for your MRI of your knee is at [name] on wednesday, february 9th at 8am 2)  I sent the meloxicam (pain/inflammation) medicine and flexeril to your pharmacy. Prescriptions: FLEXERIL 10 MG  TABS (CYCLOBENZAPRINE HCL) two times a day as needed back spasm  #30 x 1   Entered and Authorized by:   Norton Blizzard MD   Signed by:   Norton Blizzard MD on 04/28/2009   Method used:   [facility]  [address] [phone]* (retail)       [address]       [place], [place]  [address]       Ph:  [phone] or [phone]       Fax: [phone]   RxID:   [id number] MELOXICAM 15 MG TABS (MELOXICAM) 1 tab by mouth daily with food for pain  #30 x 1   Entered and Authorized by:   Norton Blizzard MD   Signed by:   Norton Blizzard MD on 04/28/2009   Method used:   [facility]        [facility]  [address] [phone]* (retail)       [address]       [place], [place]  [address]       Ph: [phone] or [phone]  Fax: [phone]   RxID:   [id number]

## 2010-04-24 NOTE — Letter (Signed)
Summary: Social Services Letter  [facility]  [address]   [place], [place] [address]   Phone: [phone]  Fax: [phone]    06/16/2009 TO: Social Services [facility]  RE: [name] [address] 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 This makes it extremely painful for her to climb stairs. She is requesting housing that has as few or no stairs as possible.          Sincerely,   Asher Muir MD

## 2010-04-24 NOTE — Letter (Signed)
Summary: Termination of Care Letter  [facility]  [address]   [place], [place] [address]   Phone: [phone]  Fax: [phone]    03/28/2009 MRN: [id number]  Dear Ms. Bakke,  Over the past year, you have missed 7 appointments with the [facility].  You received a warning letter about this issue in November of 2010.  Three of those missed appointments occured after receiving that letter.  Please be advised that the [facility] will no longer be your healthcare provider.  Accordingly, all further requests for appointments and office visits will be declined.  As such, you are not allowed on the premises of the [facility].  Should you have another physician who will take over your care, please advise [place] and we will forward a copy of your medical records.  If you do not know of another physician, we will assist you with a referral.  The termination of your relationship with this office is effective immediately.  In the next 30 days if you find that you have a need for medical care we will refer you to another provider for that care.  Thank you for your cooperation.  Sincerely,  Dennison Nancy RN MSN Clinic Manager  [facility]   Appended Document: Termination of Care Letter mailed-certified

## 2010-04-24 NOTE — Progress Notes (Signed)
Summary: MRI Knee imaging Request  MRI Knee imaging Request   Imported By: Marily Memos 05/01/2009 10:44:19  _____________________________________________________________________  External Attachment:    Type:   Image     Comment:   External Document

## 2010-04-24 NOTE — Letter (Signed)
Summary: [employer]  [employer]   Imported By: Marily Memos 05/01/2009 10:45:18  _____________________________________________________________________  External Attachment:    Type:   Image     Comment:   External Document

## 2010-04-24 NOTE — Progress Notes (Signed)
----   Converted from flag ---- ---- 08/08/2009 11:41 AM, Marily Memos wrote: Thank you, I let her know.  ---- 08/08/2009 11:09 AM, Denny Levy MD wrote: Greig Right, the personal care service guidelines have CHANGED and they have really tightened up on the qualifications---she can Google them at [facility] I think. But basically--she does not qualify Thanks!  [place] Dec ---- 08/08/2009 9:14 AM, Marily Memos wrote: Dr Jennette Kettle, I just called her and she states that on cloudy days she needs a nurse to go to her house and help her cook and take care of her.  Also said that [facility] authorized this before.  Her meds make her tired.  ---- 08/08/2009 8:38 AM, Denny Levy MD wrote: Shawna Orleans What is PCA? if it is personal care services, I do not think she qualifies but I will look. Let me know Thanks!  [place] Dec  ---- 08/07/2009 2:06 PM, Marily Memos wrote: Pt would like you to call her regarding she needs a referral for pca.  Pt contact -[phone].  She states she is in alot of pain in knee/back/elbow. She scheduled an appt for 5/23. ------------------------------

## 2010-04-24 NOTE — Miscellaneous (Signed)
Summary: APPT WITH DR Renae Fickle  Dr Renae Fickle @ [address],[place] [phone]. referral for her rigt knee pain. today at 9am.

## 2010-04-24 NOTE — Letter (Signed)
Summary: [facility] PT Referral form  [facility] PT Referral form   Imported By: Marily Memos 06/16/2009 11:32:16  _____________________________________________________________________  External Attachment:    Type:   Image     Comment:   External Document

## 2010-04-24 NOTE — Miscellaneous (Signed)
Summary: form for [place] access  Clinical Lists Changes pt dropped off a form that md needs to complete. it is to confirm or deny that pt should have a medical home. form to Dr. Jennette Kettle as she has seen her most recently  & she has not been assigned to a resident.Golden Circle RN  January 04, 2010 11:48 AM  Babs I am routing to you  Denny Levy MD  January 10, 2010 2:52 PM  Spoke with Modena Jansky, Regional Consultant with [place] [facility], regarding Ms. Lewison's request to fax a form to say she is no longer a pt here.  Per Mrs. Ferguson-Cline, we only have to send a copy of dismissal letter to her and she will handle the rest.  Called patient back to inform her of this.  Pt accepted info. Abundio Miu  January 10, 2010 3:30 PM   Appended Document: form for [place] access Received: Jan 10, 2010 03:57:57 PM  Expires: Jan 24, 2010 03:57:57 PM  From:   To:   Cc:   Subject: recipient needs a new [place] PCP    ID [id number] K NAME Arelia L Diez Hi Tonya,This recipient has been dismissed from [id number]. Please contact her regarding selecting a new [place] PCP. If she does not respond by cut off this month, please auto assign to a practice accepting new patients. Please send her a letter notifying her of the auto assignment.She was dismissed in January 2011 per the dismissal letter from [facility]. Let me know if you have any questions.Thanks,Tiffany Received: Jan 10, 2010 03:57:57 PM  Expires: Jan 24, 2010 03:57:57 PM  From:   To:   Cc:   Subject: recipient needs a new [place] PCP    ID [id number] K NAME Emmersen L Vine Hi Tonya,This recipient has been dismissed from [id number]. Please contact her regarding selecting a new [place] PCP. If she does not respond by cut off this month, please auto assign to a practice accepting new patients. Please send her a letter notifying her of the auto assignment.She was dismissed in January 2011 per the dismissal letter from [facility]. Let me know if you have any questions.Thanks,Tiffany    Clinical Lists Changes

## 2010-04-24 NOTE — Assessment & Plan Note (Signed)
Summary: 11:30 APPT - KNEE / NEEDS NOTE REGARDING STEPS   Vital Signs:  Patient profile:   45 year old female BP sitting:   120 / 83  Vitals Entered By: Lillia Pauls CMA (June 16, 2009 10:42 AM)  Primary Care Provider:  Lequita Asal  MD   History of Present Illness: f/u  right  knee pain. no better  she tried doing the exercises and it seems to make the joint swell  has questions re her MRI  is moving and wants a letter asking for no steps in her new house as steps bother her a lot  .   Allergies: No Known Drug Allergies  Physical Exam  General:  alert and well-developed.   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 One tab by mouth at bedtime as needed for trouble sleeping 2)  Lamictal 25 Mg Tabs (Lamotrigine) .... One tab by mouth at bedtime x 2 weeks, then increase to one tab by mouth two times a day. 3)  Alprazolam 1 Mg Tabs (Alprazolam) .... One half tab to one tab by mouth daily as needed anxiety 4)  Detrol La 4 Mg Cp24 (Tolterodine tartrate) .Marland Kitchen.. 1 tablet by mouth daily 5)  Nizoral 2 % Sham (Ketoconazole) .... Use as directed  dispense 1 bottle 6)  Triamcinolone Acetonide 0.1 % Crea (Triamcinolone acetonide) .... Apply bid to affected area  dispense 70 gram (or largest tube available) 7)  Voltaren 1 % Gel (Diclofenac sodium) .... Apply to affected area 1-2 times a day 8)  Flexeril 10 Mg Tabs (Cyclobenzaprine hcl) .... Two times a day as needed back spasm 9)  Tylenol Arthritis Pain 650 Mg Cr-tabs (Acetaminophen) .Marland Kitchen.. 1 tablet every 6 hours schedule for the next 3  days 10)  Meloxicam 15 Mg Tabs (Meloxicam) .Marland Kitchen.. 1 tab by mouth daily with food for pain Prescriptions: MELOXICAM 15 MG TABS (MELOXICAM) 1 tab by mouth daily with food for pain  #30 x 5   Entered by:   Asher Muir MD   Authorized by:   Denny Levy MD   Signed by:   Asher Muir MD on 06/16/2009   Method used:   [facility]        [facility]  [address] [phone]* (retail)       [address]       [place], [place]  [address]       Ph: [phone] or [phone]       Fax: [phone]   RxID:   [id number] FLEXERIL 10 MG  TABS (CYCLOBENZAPRINE HCL) two times a day as needed back spasm  #30 x 1   Entered by:   Asher Muir MD   Authorized by:   Denny Levy MD   Signed by:   Asher Muir MD on 06/16/2009   Method used:   [facility]        [facility]  [address] [phone]* (retail)       [address]       [place], [place]  [address]  Ph: [phone] or [phone]       Fax: [phone]   RxID:   [id number]

## 2010-04-24 NOTE — Progress Notes (Signed)
    Summary of Call: [place] please call her and tell her I am sending a detailed letter re her MRI of her knee. 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 Details inletter Thanks!   Denny Levy MD  May 11, 2009 11:09 AM     Pt's phone number has been disconnected.  Terese Door  May 12, 2009 12:03 PM Dr Pearletha Forge spoke w her earlier in week. I have sent her a letter and exercise handout

## 2010-04-24 NOTE — Progress Notes (Signed)
Summary: appt?  Phone Note Call from Patient Call back at [phone]   Caller: Patient Action Taken: Appt Scheduled Summary of Call: pt overslept again today and wants to resch - states that her therapist has changed meds and makes her oversleep and really needs to come in to see a doctor. Initial call taken by: De Nurse,  March 28, 2009 2:02 PM  Follow-up for Phone Call        Patient has been dismissed.  Please see letter. Follow-up by: Dennison Nancy RN,  March 28, 2009 3:13 PM

## 2010-04-24 NOTE — Letter (Signed)
Summary: MRI results Letter  [facility]  [address]   [place], [place] [address]   Phone: [phone]  Fax: [phone]    05/11/2009  [name] [address] [place], [place]  [address]  Dear Ms. Carawan,  Your MRI showed some pretty severe arthritis type changes in your knee cap. This is likely what is causing your pain. The ligaments and menisci in your knee are fortunately normal!   For this type of pain, I would recommend: 1)weight loss to take the pressure of the knee cap 2) a rehab program to strengthen the knee muscles. 3) We could also try some medications such as aleve if your stomach can tolerate them.   I am enclosing a knee rehab program. If you would like to discuss it in detail, please see me at your convenience in clinic. If you would prefer to go for more formal physical therapy, let me kknow and I can set that up.  Please call me with questions.         Sincerely,   Denny Levy MD

## 2010-04-24 NOTE — Progress Notes (Signed)
Summary: [facility] calling  Phone Note From Other Clinic   Caller: Judeth Cornfield @ [facility] [phone] Action Taken: Phone Call Completed Summary of Call: Judeth Cornfield calling to let us know they need to referr patient to another office and need our OK and NPI number.  Returned [employer] call and informed her that patient has been dismissed from our practice so I am unable to provide NPI number to her for referral. Initial call taken by: Terese Door,  February 05, 2010 10:55 AM

## 2010-06-08 LAB — CBC
HCT: 40 % (ref 36.0–46.0)
Hemoglobin: 12.8 g/dL (ref 12.0–15.0)
MCH: 28.6 pg (ref 26.0–34.0)
MCV: 89.5 fL (ref 78.0–100.0)
RBC: 4.47 MIL/uL (ref 3.87–5.11)

## 2010-06-08 LAB — DIFFERENTIAL
Eosinophils Absolute: 0.2 10*3/uL (ref 0.0–0.7)
Eosinophils Relative: 2 % (ref 0–5)
Lymphs Abs: 2.9 10*3/uL (ref 0.7–4.0)
Monocytes Relative: 9 % (ref 3–12)

## 2010-06-08 LAB — POCT I-STAT, CHEM 8
Creatinine, Ser: 0.9 mg/dL (ref 0.4–1.2)
Glucose, Bld: 101 mg/dL — ABNORMAL HIGH (ref 70–99)
Hemoglobin: 14.3 g/dL (ref 12.0–15.0)
TCO2: 25 mmol/L (ref 0–100)

## 2010-06-08 LAB — WOUND CULTURE

## 2010-06-25 ENCOUNTER — Other Ambulatory Visit (HOSPITAL_COMMUNITY): Payer: Self-pay | Admitting: Obstetrics

## 2010-06-25 DIAGNOSIS — Z1231 Encounter for screening mammogram for malignant neoplasm of breast: Secondary | ICD-10-CM

## 2010-07-04 ENCOUNTER — Ambulatory Visit (HOSPITAL_COMMUNITY): Payer: Medicaid Other

## 2010-07-05 ENCOUNTER — Ambulatory Visit (HOSPITAL_COMMUNITY): Payer: Medicaid Other

## 2010-07-12 ENCOUNTER — Ambulatory Visit (HOSPITAL_COMMUNITY): Payer: Medicaid Other

## 2010-08-07 NOTE — Consult Note (Signed)
[name], [name] NO.:  [id number]   MEDICAL RECORD NO.:  [id number]          PATIENT TYPE:  WOC   LOCATION:  WOC                          FACILITY:  [facility]   PHYSICIAN:  Allie Bossier, MD        DATE OF BIRTH:  November 05, 1965   DATE OF CONSULTATION:  01/16/2007  DATE OF DISCHARGE:                                 CONSULTATION   [name] is a 45 year old separated black gravida 5 para 4 abortus 1  with 3 living children.  She is seen here as a referral from the [facility].  She reports that she went through  menopause approximately 2 years ago but began having some spotting this  spring.  She was evaluated at their clinic this spring with a gyn  ultrasound that showed endometrial lesion of 6.3 mm.  They also did an  endometrial biopsy which showed no cells.  The ultrasound showed a 1.3 x  1.0 solid rounded lesion in the right ovary.  She was counseled to have  an MRI for followup, but she has not done that as of yet.  Her major  complaint to me today is of bilateral pelvic pain.  She reports that it  has been present for the last 2 weeks.  She also reports severe  dyspareunia.  She denies any unusual discharge.  She tried over-the-  counter pain medicine with no relief and used Vicodin that she had for  lower back pain.  She says this helped.   On physical exam, she appears to have some reasonably normal difficulty  remembering time frames and her symptomatology progression.  External  genitalia are normal.  Her vulva is shaved.  There are no lesions.  She  displays extreme tenderness just touching the introitus of her vagina.  Her vagina has a creamy-white, slightly frothy discharge that has an  unpleasant odor.  Wet prep is sent.  Cervical cultures were obtained.  She appears to be tender everywhere she is touched which would by  Idara Woodside give her cervical motion tenderness and adnexal pain.  There are  no adnexal masses appreciated.   ASSESSMENT AND PLAN:  1. Postmenopausal bleeding with a solid lesion seen on ultrasound on      April 2008.  We will repeat the ultrasound and do an MRI as      necessary.  2. Pelvic pain.  I will treat empirically for pelvic inflammatory      disease with Zithromax 1 g p.o. x1 and Rocephin 250 mg IM.  I will      defer narcotic pain management at this time and have her follow up      after her ultrasound.      Allie Bossier, MD  Electronically Signed     MCD/MEDQ  D:  01/16/2007  T:  01/17/2007  Job:  [id number]

## 2010-08-10 NOTE — Op Note (Signed)
NAME:  [name], [name]                       ACCOUNT NO.:  [id number]   MEDICAL RECORD NO.:  [id number]                   PATIENT TYPE:  REC   LOCATION:  FOOT                                 FACILITY:  [facility]   PHYSICIAN:  Dionne Ano. Everlene Other, M.D.         DATE OF BIRTH:  1965-09-14   DATE OF PROCEDURE:  09/13/2002  DATE OF DISCHARGE:                                 OPERATIVE REPORT   INDICATIONS FOR PROCEDURE:  The patient presented to the [facility] Emergency Room for the second time in four days.  She was seen on  Friday and started on Erythromycin for an early paronychial process.  She  presents today with increased swelling and pain.  Dr. Estell Harpin and the  physician's assistant discussed this with her, and I was called to see her  in regards to the right index finger.  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  She does not note any traumatic injury,  other than the biting habit to the fingertips.   PAST MEDICAL HISTORY:  None.   PAST SURGICAL HISTORY:  Multiple D&C's and C-sections.   MEDICATIONS:  Erythromycin x4 days.   ALLERGIES:  No known drug allergies.   SOCIAL HISTORY:  She smokes one pack per day.   PHYSICAL EXAMINATION:  CHEST:  Normal.  NECK:  Normal.  BACK:  Normal examination.  EXTREMITIES:  Shoulder, elbow and wrist examinations are nontender.  The  nails have chronic inflammatory changes from the biting process.  Her right  index finger has gross swelling and fluctuance over the eponychial region.  NEUROLOGIC:  The patient has normal FPT, FPS and extensor feeling.  She  walks with a nonantalgic gait.  VITAL SIGNS:  Stable.   RECOMMENDATIONS:  I have examined her at length and discussed all issues  with her.  I feel that she has a deep abscess and I have discussed her I&D  and a debridement as necessary.   DESCRIPTION OF PROCEDURE:  The patient was given a Marcaine and lidocaine  without epinephrine block.  She  then underwent a sterile Betadine scrub and  paint.  She was then isolated and a tourniquet was applied to the finger.  I  then performed a partial nail plate removal and then an I&D of deep tissue,  including the pulp and eponychial region.  I debrided skin, subcutaneous  tissue, and this was an excisional debridement.  The abscess was  decompressed and was quite deep.  Cultures aerobic, and anaerobic were sent.  Following this, I irrigated the wound copiously and packed it, and then  placed her in a splint.  I obtained x-rays after the procedure and will  review these.   IMPRESSION:  Abscess, right index finger, requiring an incision and  drainage, a decompression and partial nail plate removal.   PLAN:  I am going to have her begin whirlpools tomorrow.  I have placed her  on Keflex  as well as Vicodin for pain.  I will see her back in the office  in one week.  I have asked her to notify me sooner if any problems,  questions,  or concerns arise.  This was a significant abscess with a large amount of purulent material.  She tolerated this well, and there were no complicating features.                                               Dionne Ano. Everlene Other, M.D.    Nash Mantis  D:  09/13/2002  T:  09/14/2002  Job:  [id number]   cc:   Bethann Berkshire, M.D.

## 2010-08-12 ENCOUNTER — Inpatient Hospital Stay (INDEPENDENT_AMBULATORY_CARE_PROVIDER_SITE_OTHER)
Admission: RE | Admit: 2010-08-12 | Discharge: 2010-08-12 | Disposition: A | Discharge status: Home / Self Care | Payer: Medicaid Other | Source: Ambulatory Visit | Attending: Emergency Medicine | Admitting: Emergency Medicine

## 2010-08-12 DIAGNOSIS — L02619 Cutaneous abscess of unspecified foot: Secondary | ICD-10-CM

## 2010-08-15 ENCOUNTER — Inpatient Hospital Stay (HOSPITAL_COMMUNITY)
Admission: RE | Admit: 2010-08-15 | Discharge: 2010-08-15 | Disposition: A | Discharge status: Home / Self Care | Payer: Self-pay | Source: Ambulatory Visit | Attending: Emergency Medicine | Admitting: Emergency Medicine

## 2010-08-18 ENCOUNTER — Emergency Department (HOSPITAL_COMMUNITY)
Admission: EM | Admit: 2010-08-18 | Discharge: 2010-08-18 | Disposition: A | Discharge status: Home / Self Care | Payer: Medicaid Other | Attending: Emergency Medicine | Admitting: Emergency Medicine

## 2010-08-18 DIAGNOSIS — L03119 Cellulitis of unspecified part of limb: Secondary | ICD-10-CM | POA: Insufficient documentation

## 2010-08-18 DIAGNOSIS — L02619 Cutaneous abscess of unspecified foot: Secondary | ICD-10-CM | POA: Insufficient documentation

## 2010-08-18 DIAGNOSIS — Z79899 Other long term (current) drug therapy: Secondary | ICD-10-CM | POA: Insufficient documentation

## 2010-08-18 DIAGNOSIS — F319 Bipolar disorder, unspecified: Secondary | ICD-10-CM | POA: Insufficient documentation

## 2010-08-28 ENCOUNTER — Encounter (HOSPITAL_BASED_OUTPATIENT_CLINIC_OR_DEPARTMENT_OTHER): Payer: Medicaid Other | Attending: General Surgery

## 2010-08-28 DIAGNOSIS — Z79899 Other long term (current) drug therapy: Secondary | ICD-10-CM | POA: Insufficient documentation

## 2010-08-28 DIAGNOSIS — S81009A Unspecified open wound, unspecified knee, initial encounter: Secondary | ICD-10-CM | POA: Insufficient documentation

## 2010-08-28 DIAGNOSIS — X58XXXA Exposure to other specified factors, initial encounter: Secondary | ICD-10-CM | POA: Insufficient documentation

## 2010-09-25 ENCOUNTER — Encounter (HOSPITAL_BASED_OUTPATIENT_CLINIC_OR_DEPARTMENT_OTHER): Payer: Medicaid Other | Attending: General Surgery

## 2010-09-25 DIAGNOSIS — X58XXXA Exposure to other specified factors, initial encounter: Secondary | ICD-10-CM | POA: Insufficient documentation

## 2010-09-25 DIAGNOSIS — S91009A Unspecified open wound, unspecified ankle, initial encounter: Secondary | ICD-10-CM | POA: Insufficient documentation

## 2010-09-25 DIAGNOSIS — S81009A Unspecified open wound, unspecified knee, initial encounter: Secondary | ICD-10-CM | POA: Insufficient documentation

## 2010-09-25 DIAGNOSIS — Z79899 Other long term (current) drug therapy: Secondary | ICD-10-CM | POA: Insufficient documentation

## 2010-11-11 ENCOUNTER — Inpatient Hospital Stay (INDEPENDENT_AMBULATORY_CARE_PROVIDER_SITE_OTHER)
Admission: RE | Admit: 2010-11-11 | Discharge: 2010-11-11 | Disposition: A | Discharge status: Home / Self Care | Payer: Medicaid Other | Source: Ambulatory Visit | Attending: Emergency Medicine | Admitting: Emergency Medicine

## 2010-11-11 DIAGNOSIS — M109 Gout, unspecified: Secondary | ICD-10-CM

## 2010-11-11 LAB — CBC
Platelets: 288 10*3/uL (ref 150–400)
RBC: 4.63 MIL/uL (ref 3.87–5.11)
RDW: 14.7 % (ref 11.5–15.5)
WBC: 8 10*3/uL (ref 4.0–10.5)

## 2010-11-11 LAB — DIFFERENTIAL
Basophils Absolute: 0 10*3/uL (ref 0.0–0.1)
Basophils Relative: 1 % (ref 0–1)
Eosinophils Absolute: 0.2 10*3/uL (ref 0.0–0.7)
Eosinophils Relative: 2 % (ref 0–5)
Neutrophils Relative %: 52 % (ref 43–77)

## 2010-11-11 LAB — URIC ACID: Uric Acid, Serum: 4.9 mg/dL (ref 2.4–7.0)

## 2010-11-20 NOTE — Consult Note (Signed)
  [name], SALAMEH NO.:  [id number]  MEDICAL RECORD NO.:  [id number]  LOCATION:  URG                          FACILITY:  [facility]  PHYSICIAN:  Artist Pais. Phillip Sandler, M.D.DATE OF BIRTH:  Jun 09, 1965  DATE OF CONSULTATION:  11/11/2010 DATE OF DISCHARGE:                                CONSULTATION   REQUESTING PHYSICIAN:  Reuben Likes, MD  REASON FOR CONSULTATION:  Ms. Mac is a 44 year old right-hand dominant female who presents today with right long finger pain and swelling over the last week with no antecedent trauma, no penetrating injury, etc.  She is 44.  She has a history of bipolar disorder, some medical issues nothing of significance and did not have a regular family medical doctor to see someone here in town for GYN care.  Denies any penetrating trauma, denies injuries, presents today with painful and swollen right long finger DIP joint area.  Examination reveals negative Kanavel signs.  No pain in flexor sheath.  She is mildly swollen over the DIP joint and pulp of the distal phalanx, but no signs of ascending infection.  White count is normal at 8000 and no streak up the arm and adenopathy.  Denies fever, chills, etc.  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  Since her white count is normal and she shows no signs of ascending infection, we are going to treat her with a dose of Toradol IM here in the [facility], should be discharged on Bactrim or doxycycline to cover for methicillin-resistant staph and also I have urged her to take Advil 800 mg 3 times a day with food for her gout and I will see her back in my office either Tuesday or Thursday this week. If she is doing quite well, she is more happy to call and cancel.  If not, we will continue to follow this to make sure this is  resolving.     Artist Pais Mina Marble, M.D.     MAW/MEDQ  D:  11/11/2010  T:  11/11/2010  Job:  [id number]  Electronically Signed by Dairl Ponder M.D. on 11/20/2010 09:36:39 AM
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
## 2011-01-21 DIAGNOSIS — M2240 Chondromalacia patellae, unspecified knee: Secondary | ICD-10-CM

## 2011-01-21 DIAGNOSIS — M6750 Plica syndrome, unspecified knee: Secondary | ICD-10-CM

## 2011-01-21 HISTORY — DX: Chondromalacia patellae, unspecified knee: M22.40

## 2011-01-21 HISTORY — DX: Plica syndrome, unspecified knee: M67.50

## 2011-03-26 HISTORY — PX: KNEE CARTILAGE SURGERY: SHX688

## 2012-01-30 ENCOUNTER — Encounter (HOSPITAL_COMMUNITY): Payer: Self-pay | Admitting: Emergency Medicine

## 2012-01-30 ENCOUNTER — Emergency Department (HOSPITAL_COMMUNITY)
Admission: EM | Admit: 2012-01-30 | Discharge: 2012-01-30 | Disposition: A | Discharge status: Home / Self Care | Payer: Medicaid Other | Attending: Emergency Medicine | Admitting: Emergency Medicine

## 2012-01-30 DIAGNOSIS — Y9229 Other specified public building as the place of occurrence of the external cause: Secondary | ICD-10-CM | POA: Insufficient documentation

## 2012-01-30 DIAGNOSIS — F411 Generalized anxiety disorder: Secondary | ICD-10-CM | POA: Insufficient documentation

## 2012-01-30 DIAGNOSIS — F3289 Other specified depressive episodes: Secondary | ICD-10-CM | POA: Insufficient documentation

## 2012-01-30 DIAGNOSIS — F17200 Nicotine dependence, unspecified, uncomplicated: Secondary | ICD-10-CM | POA: Insufficient documentation

## 2012-01-30 DIAGNOSIS — F329 Major depressive disorder, single episode, unspecified: Secondary | ICD-10-CM | POA: Insufficient documentation

## 2012-01-30 DIAGNOSIS — S335XXA Sprain of ligaments of lumbar spine, initial encounter: Secondary | ICD-10-CM | POA: Insufficient documentation

## 2012-01-30 DIAGNOSIS — X58XXXA Exposure to other specified factors, initial encounter: Secondary | ICD-10-CM | POA: Insufficient documentation

## 2012-01-30 DIAGNOSIS — S39012A Strain of muscle, fascia and tendon of lower back, initial encounter: Secondary | ICD-10-CM

## 2012-01-30 DIAGNOSIS — Y9341 Activity, dancing: Secondary | ICD-10-CM | POA: Insufficient documentation

## 2012-01-30 HISTORY — DX: Anxiety disorder, unspecified: F41.9

## 2012-01-30 HISTORY — DX: Depression, unspecified: F32.A

## 2012-01-30 HISTORY — DX: Major depressive disorder, single episode, unspecified: F32.9

## 2012-01-30 MED ORDER — NAPROXEN 375 MG PO TABS: 375.0000 mg | ORAL_TABLET | Freq: Two times a day (BID) | ORAL | Status: DC

## 2012-01-30 MED ORDER — CYCLOBENZAPRINE HCL 10 MG PO TABS: 10.0000 mg | ORAL_TABLET | Freq: Two times a day (BID) | ORAL | Status: DC | PRN
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
## 2012-01-30 NOTE — ED Provider Notes (Signed)
History   This chart was scribed for Cathy Quarry, MD by Gerlean Ren, ER Scribe. This patient was seen in room TR10C/TR10C and the patient's care was started at 7:04 PM    CSN: [id number]  Arrival date & time 01/30/12  1742   First MD Initiated Contact with Patient 01/30/12 1902      Chief Complaint  Patient presents with  . Back Pain    The history is provided by the patient. No language interpreter was used.   [name] is a 46 y.o. female who presents to the Emergency Department complaining of 5 days of gradually worsening, constant, non-radiating sharp right lower back pain with gradual onset after dancing Saturday night.  Pt denies any fall or trauma as cause.  Pt currently takes Xanax 3 times per day with no improvements.  Pt has h/o depression and anxiety.  Pt is a current everyday smoker and reports alcohol use. Pt does not have PCP but has Xanax prescribed by psychiatrist.    Past Medical History  Diagnosis Date  . Depression   . Anxiety     History reviewed. No pertinent past surgical history.  History reviewed. No pertinent family history.  History  Substance Use Topics  . Smoking status: Current Every Day Smoker  . Smokeless tobacco: Not on file  . Alcohol Use: Yes    No OB history provided.   Review of Systems  Musculoskeletal: Positive for back pain.  Neurological: Negative for weakness and numbness.    Allergies  Review of patient's allergies indicates no known allergies.  Home Medications  No current outpatient prescriptions on file.  BP 144/71  Pulse 67  Temp 98.3 F (36.8 C) (Oral)  Resp 20  SpO2 97%  Physical Exam  Nursing note and vitals reviewed. Constitutional: She is oriented to person, place, and time. She appears well-developed and well-nourished.  HENT:  Head: Normocephalic and atraumatic.  Eyes: Conjunctivae normal and EOM are normal.  Neck: Normal range of motion. No tracheal deviation present.  Cardiovascular:  Normal rate, regular rhythm and normal heart sounds.   Pulmonary/Chest: Effort normal and breath sounds normal.  Abdominal: She exhibits no distension.  Musculoskeletal: Normal range of motion. She exhibits no edema.       Paraspinal lumbar tenderness.  Neurological: She is oriented to person, place, and time.       No neurological deficits.  Skin: Skin is warm and dry.  Psychiatric: She has a normal mood and affect.    ED Course  Procedures (including critical care time) DIAGNOSTIC STUDIES: Oxygen Saturation is 97% on room air, adequate by my interpretation.    COORDINATION OF CARE: 7:07 PM- Patient informed of clinical course, understands medical decision-making process, and agrees with plan.  Ordered PO flexeril and PO naprosyn at discharge to each be taken twice daily.      Labs Reviewed - No data to display No results found.   No diagnosis found.    MDM  I personally performed the services described in this documentation, which was scribed in my presence. The recorded information has been reviewed and is accurate.         Cathy Quarry, MD 02/02/12 [phone]

## 2012-02-23 ENCOUNTER — Encounter (HOSPITAL_COMMUNITY): Payer: Self-pay

## 2012-02-23 ENCOUNTER — Emergency Department (HOSPITAL_COMMUNITY): Payer: Medicaid Other

## 2012-02-23 ENCOUNTER — Emergency Department (HOSPITAL_COMMUNITY)
Admission: EM | Admit: 2012-02-23 | Discharge: 2012-02-23 | Disposition: A | Discharge status: Home / Self Care | Payer: Medicaid Other | Attending: Emergency Medicine | Admitting: Emergency Medicine

## 2012-02-23 DIAGNOSIS — F341 Dysthymic disorder: Secondary | ICD-10-CM | POA: Insufficient documentation

## 2012-02-23 DIAGNOSIS — IMO0002 Reserved for concepts with insufficient information to code with codable children: Secondary | ICD-10-CM | POA: Insufficient documentation

## 2012-02-23 DIAGNOSIS — M25569 Pain in unspecified knee: Secondary | ICD-10-CM

## 2012-02-23 DIAGNOSIS — M1710 Unilateral primary osteoarthritis, unspecified knee: Secondary | ICD-10-CM

## 2012-02-23 DIAGNOSIS — F17200 Nicotine dependence, unspecified, uncomplicated: Secondary | ICD-10-CM | POA: Insufficient documentation

## 2012-02-23 MED ORDER — IBUPROFEN 800 MG PO TABS
800.0000 mg | ORAL_TABLET | Freq: Once | ORAL | Status: AC
  Administered 2012-02-23: 800 mg via ORAL
  Filled 2012-02-23: qty 1

## 2012-02-23 MED ORDER — HYDROCODONE-ACETAMINOPHEN 5-325 MG PO TABS: 1.0000 | ORAL_TABLET | Freq: Four times a day (QID) | ORAL | Status: DC | PRN

## 2012-02-23 MED ORDER — OXYCODONE-ACETAMINOPHEN 5-325 MG PO TABS
1.0000 | ORAL_TABLET | Freq: Once | ORAL | Status: AC
  Administered 2012-02-23: 1 via ORAL
  Filled 2012-02-23: qty 1

## 2012-02-23 MED ORDER — PREDNISONE 50 MG PO TABS: 50.0000 mg | ORAL_TABLET | Freq: Every day | ORAL | Status: DC

## 2012-02-23 NOTE — ED Provider Notes (Signed)
Medical screening examination/treatment/procedure(s) were performed by non-physician practitioner and as supervising physician I was immediately available for consultation/collaboration.   Celene Kras, MD 02/23/12 [phone]

## 2012-02-23 NOTE — ED Provider Notes (Signed)
History     CSN: [id number]  Arrival date & time 02/23/12  4098   First MD Initiated Contact with Patient 02/23/12 0730      Chief Complaint  Patient presents with  . Knee Pain    (Consider location/radiation/quality/duration/timing/severity/associated sxs/prior treatment) HPI The patient presents to the ER with R knee pain. The patient states that this is a chronic issue but began to bother her last night. The patient states that she was at a dinner party and was on her feet a lot. The patient denies abdominal pain, weakness, numbness, nausea, vomiting, or fever. The patient states that she was suppose to have knee replacement but did not want to have that done.  Past Medical History  Diagnosis Date  . Depression   . Anxiety     History reviewed. No pertinent past surgical history.  History reviewed. No pertinent family history.  History  Substance Use Topics  . Smoking status: Current Every Day Smoker  . Smokeless tobacco: Not on file  . Alcohol Use: Yes    OB History    Grav Para Term Preterm Abortions TAB SAB Ect Mult Living                  Review of Systems All other systems negative except as documented in the HPI. All pertinent positives and negatives as reviewed in the HPI.  Allergies  Review of patient's allergies indicates no known allergies.  Home Medications   Current Outpatient Rx  Name  Route  Sig  Dispense  Refill  . CYCLOBENZAPRINE HCL 10 MG PO TABS   Oral   Take 1 tablet (10 mg total) by mouth 2 (two) times daily as needed for muscle spasms.   20 tablet   0   . NAPROXEN 375 MG PO TABS   Oral   Take 1 tablet (375 mg total) by mouth 2 (two) times daily.   20 tablet   0     BP 113/60  Pulse 62  Temp 98.1 F (36.7 C) (Oral)  Resp 14  SpO2 99%  Physical Exam  Nursing note and vitals reviewed. Constitutional: She is oriented to person, place, and time. She appears well-developed and well-nourished. No distress.  HENT:  Head:  Normocephalic and atraumatic.  Eyes: Pupils are equal, round, and reactive to light.  Neck: Normal range of motion. Neck supple.  Musculoskeletal:       Right knee: She exhibits normal range of motion, no swelling, no effusion, no ecchymosis, no deformity, no laceration, no erythema, normal alignment, no LCL laxity, normal patellar mobility, no bony tenderness and no MCL laxity. tenderness found. Medial joint line and MCL tenderness noted.       Legs: Neurological: She is alert and oriented to person, place, and time.  Skin: Skin is warm and dry. No rash noted.    ED Course  Procedures (including critical care time)  The patient will be referred back to [facility]. The patient is advised to ice and elevate her knee. The patient is advised to return here as needed.    MDM          Carlyle Dolly, PA-C 02/23/12 [phone]

## 2012-02-23 NOTE — ED Notes (Signed)
Pt dancing last night, woke with severe right knee pain

## 2012-03-05 DIAGNOSIS — M1710 Unilateral primary osteoarthritis, unspecified knee: Secondary | ICD-10-CM | POA: Insufficient documentation

## 2012-03-05 DIAGNOSIS — M179 Osteoarthritis of knee, unspecified: Secondary | ICD-10-CM | POA: Insufficient documentation

## 2012-03-05 HISTORY — DX: Osteoarthritis of knee, unspecified: M17.9

## 2012-04-08 ENCOUNTER — Encounter: Payer: Self-pay | Admitting: Family Medicine

## 2012-04-08 ENCOUNTER — Ambulatory Visit (INDEPENDENT_AMBULATORY_CARE_PROVIDER_SITE_OTHER): Payer: Medicaid Other | Admitting: Family Medicine

## 2012-04-08 VITALS — BP 130/82 | HR 54 | Temp 98.1°F | Ht 75.0 in | Wt 236.7 lb

## 2012-04-08 DIAGNOSIS — K219 Gastro-esophageal reflux disease without esophagitis: Secondary | ICD-10-CM | POA: Insufficient documentation

## 2012-04-08 DIAGNOSIS — R519 Headache, unspecified: Secondary | ICD-10-CM | POA: Insufficient documentation

## 2012-04-08 DIAGNOSIS — F319 Bipolar disorder, unspecified: Secondary | ICD-10-CM

## 2012-04-08 DIAGNOSIS — R51 Headache: Secondary | ICD-10-CM

## 2012-04-08 DIAGNOSIS — M25569 Pain in unspecified knee: Secondary | ICD-10-CM

## 2012-04-08 DIAGNOSIS — M545 Low back pain: Secondary | ICD-10-CM

## 2012-04-08 LAB — COMPREHENSIVE METABOLIC PANEL
ALT: 11 U/L (ref 0–35)
BUN: 9 mg/dL (ref 6–23)
CO2: 23 mEq/L (ref 19–32)
Calcium: 9 mg/dL (ref 8.4–10.5)
Chloride: 106 mEq/L (ref 96–112)
Creat: 0.88 mg/dL (ref 0.50–1.10)
Glucose, Bld: 83 mg/dL (ref 70–99)
Total Bilirubin: 0.3 mg/dL (ref 0.3–1.2)

## 2012-04-08 LAB — POCT GLYCOSYLATED HEMOGLOBIN (HGB A1C): Hemoglobin A1C: 5.6

## 2012-04-08 LAB — CBC
HCT: 41.2 % (ref 36.0–46.0)
RBC: 4.89 MIL/uL (ref 3.87–5.11)
RDW: 14.2 % (ref 11.5–15.5)
WBC: 8.1 10*3/uL (ref 4.0–10.5)

## 2012-04-08 MED ORDER — NAPROXEN 375 MG PO TABS: 375.0000 mg | ORAL_TABLET | Freq: Two times a day (BID) | ORAL | Status: DC

## 2012-04-08 MED ORDER — PANTOPRAZOLE SODIUM 40 MG PO TBEC: 40.0000 mg | DELAYED_RELEASE_TABLET | Freq: Every day | ORAL | Status: DC

## 2012-04-08 MED ORDER — CYCLOBENZAPRINE HCL 10 MG PO TABS: 10.0000 mg | ORAL_TABLET | Freq: Two times a day (BID) | ORAL | Status: DC | PRN

## 2012-04-08 NOTE — Patient Instructions (Signed)
Nice to meet you. I will call if labs are abnormal. You may try aleve or tylenol for a headache. Start protonix for acid reflux. Take every day for best results. Avoid caffeine, spicy, alcohol and tobacco to help acid reflux.  If your headache gets worse or doesn't improve, then return for appointment.   Gastroesophageal Reflux Disease, Adult Gastroesophageal reflux disease (GERD) happens when acid from your stomach goes into your food pipe (esophagus). The acid can cause a burning feeling in your chest. Over time, the acid can make small holes (ulcers) in your food pipe.  HOME CARE  Ask your doctor for advice about:  Losing weight.  Quitting smoking.  Alcohol use.  Avoid foods and drinks that make your problems worse. You may want to avoid:  Caffeine and alcohol.  Chocolate.  Mints.  Garlic and onions.  Spicy foods.  Citrus fruits, such as oranges, lemons, or limes.  Foods that contain tomato, such as sauce, chili, salsa, and pizza.  Fried and fatty foods.  Avoid lying down for 3 hours before you go to bed or before you take a nap.  Eat small meals often, instead of large meals.  Wear loose-fitting clothing. Do not wear anything tight around your waist.  Raise (elevate) the head of your bed 6 to 8 inches with wood blocks. Using extra pillows does not help.  Only take medicines as told by your doctor.  Do not take aspirin or ibuprofen. GET HELP RIGHT AWAY IF:   You have pain in your arms, neck, jaw, teeth, or back.  Your pain gets worse or changes.  You feel sick to your stomach (nauseous), throw up (vomit), or sweat (diaphoresis).  You feel short of breath, or you pass out (faint).  Your throw up is green, yellow, black, or looks like coffee grounds or blood.  Your poop (stool) is red, bloody, or black. MAKE SURE YOU:   Understand these instructions.  Will watch your condition.  Will get help right away if you are not doing well or get  worse. Document Released: 08/28/2007 Document Revised: 06/03/2011 Document Reviewed: 09/28/2010 [facility] Patient Information 2013 [place], [place].

## 2012-04-08 NOTE — Assessment & Plan Note (Signed)
No sign of exacerbation today. Continue meds per Dr. Jeanie Sewer.

## 2012-04-08 NOTE — Assessment & Plan Note (Signed)
Not well controlled. Start trial of PPI. Discuss dietary modifications-tobacco, alcohol avoidance. If remains symptomatic or other red flags, consider H pylori testing or further evaluation.

## 2012-04-08 NOTE — Assessment & Plan Note (Signed)
Detected on ROS, not on patient's priority list today. Discussed trial of toradol IM today. F/u if not improving or red flags.

## 2012-04-08 NOTE — Progress Notes (Signed)
Subjective:    Patient ID: [name], female    DOB: Oct 10, 1965, 47 y.o.   MRN: [id number]  HPI New patient to establish care.  1. GERD. Suffers with reflux, cough and throat irritation worse at night. Using zantac infrequently. Tries to avoid spicy foods, but smokes 1/3 ppd and alcohol about 10 drinks per month. Denies pain, blood in stool, weight loss.  2. Bipolar disorder. Sees Psychiatrist Dr. Jeanie Sewer. She takes most of her meds prn including depakote and geodon, xanax. Currently feels stable. States her meds keeps her from snapping on her boyfriend. She is disabled since 2009 for psych issues. No mood changes recently.  3. Chronic low back and knee pain. Sees ortho at [facility]. Using naproxen and prednisone bursts sometimes-not recently. [facility] MD prescribes vicodin 10 mg tabs. She is scheduled to return back to [facility] to see if she would need a knee replacement or not. Has chronic slight swelling. Hx of cartilage repair in 2013.  4. Headache, present today, not typical for her. Frontal bilateral, moderate. This is not her most important topic of discussion today, and she denies any visual changes, weakness, numbness, fever, neck stiffness or other red flags so we agree to discuss later if this is persistent after trial of toradol injection today.   Past Medical History  Diagnosis Date  . Depression   . Anxiety   . Panic attacks   . GERD (gastroesophageal reflux disease)   . Arthritis    Past Surgical History  Procedure Date  . Knee cartilage surgery 2013    right    Family History  Problem Relation Age of Onset  . Hypertension Mother   . Diabetes Mother   . Diabetes Father   . Hypertension Father    History   Social History  . Marital Status: Single    Spouse Name: N/A    Number of Children: N/A  . Years of Education: N/A   Occupational History  . Not on file.   Social History Main Topics  . Smoking status: Current Every Day Smoker -- 0.5 packs/day  . Smokeless  tobacco: Not on file  . Alcohol Use: Yes     Comment: Drinks <10 monthly  . Drug Use: No  . Sexually Active:    Other Topics Concern  . Not on file   Social History Narrative   3 children (2 daughters and 1 son). Engaged, not working she is disabled since 2009 (anxiety, Bipolar). Finished 11th grade.     Review of Systems Endorses hot flashes, irregular menses, muscle cramping, dizziness (as med side effect), urinary incontinence with stress, hand swelling intermittently, loose stools    Objective:   Physical Exam  Vitals reviewed. Constitutional: She is oriented to person, place, and time. She appears well-developed and well-nourished. No distress.  HENT:  Head: Normocephalic and atraumatic.  Nose: Nose normal.  Mouth/Throat: Oropharynx is clear and moist. No oropharyngeal exudate.       Feels TTP in her scalp and face, non-localized.  No rash.   Eyes: EOM are normal. Pupils are equal, round, and reactive to light.  Neck: Normal range of motion. Neck supple. No thyromegaly present.  Cardiovascular: Normal rate, regular rhythm and normal heart sounds.   No murmur heard. Pulmonary/Chest: Effort normal and breath sounds normal. No respiratory distress. She has no wheezes.  Abdominal: Soft. Bowel sounds are normal. There is no tenderness. There is no rebound.  Musculoskeletal: She exhibits no edema and no tenderness.  Right knee brace  Lymphadenopathy:    She has no cervical adenopathy.  Neurological: She is alert and oriented to person, place, and time. No cranial nerve deficit. She exhibits normal muscle tone.       Normal finger-nose. Normal gait and balance.  Sensation and motor function grossly intact.  Skin: No rash noted. She is not diaphoretic.  Psychiatric: She has a normal mood and affect. Her behavior is normal. Thought content normal.       Speech normal.          Assessment & Plan:

## 2012-04-08 NOTE — Assessment & Plan Note (Signed)
No neurologic deficits. Likely related to OA or chronic pain syndrome. Advised to continue meds (vicodin) per orthopedist and remain active. Refill naproxen today.

## 2012-04-09 ENCOUNTER — Encounter: Payer: Self-pay | Admitting: Family Medicine

## 2012-05-18 ENCOUNTER — Encounter: Payer: Self-pay | Admitting: Family Medicine

## 2012-05-18 ENCOUNTER — Ambulatory Visit (INDEPENDENT_AMBULATORY_CARE_PROVIDER_SITE_OTHER): Payer: Medicaid Other | Admitting: Family Medicine

## 2012-05-18 VITALS — BP 140/88 | HR 60 | Temp 97.9°F | Ht 75.0 in | Wt 242.0 lb

## 2012-05-18 DIAGNOSIS — K0889 Other specified disorders of teeth and supporting structures: Secondary | ICD-10-CM

## 2012-05-18 DIAGNOSIS — K1379 Other lesions of oral mucosa: Secondary | ICD-10-CM | POA: Insufficient documentation

## 2012-05-18 DIAGNOSIS — K089 Disorder of teeth and supporting structures, unspecified: Secondary | ICD-10-CM

## 2012-05-18 MED ORDER — AMOXICILLIN-POT CLAVULANATE 875-125 MG PO TABS: 1.0000 | ORAL_TABLET | Freq: Two times a day (BID) | ORAL | Status: DC

## 2012-05-18 NOTE — Assessment & Plan Note (Signed)
Does indeed appear to be infected. Augmentin to treat. FU with dentist, which she is currently trying to schedule. Red flags regarding sepsis or worsening infection provided.

## 2012-05-18 NOTE — Patient Instructions (Signed)
I have provided the letter for you with a list of your diagnoses.    Take the Augmentin for your tooth pain.  I hope you feel better!

## 2012-05-18 NOTE — Progress Notes (Signed)
[name] is a 47 y.o. female who presents to [facility] today with 2 concerns:  1.  Tooth pain:  Patient had Left upper molar removed several years ago and has trouble with intermittent pain for several years and feeling that "piece of tooth remains" in the spot.  For past week or so she has had increasing pain and some drainage from site as well as warmth.  Concerned it is infected.  Called dentist who wanted her on antibiotics before she could be seen.  No fevers/chills.  No nausea/vomiting.  Pain relieved by warm water rinses.    2.  Letter request:  Patient needs a letter listing her diagnoses for tax purposes.  I have provided her a letter delineating her current medical problems and past medical history.   The following portions of the patient's history were reviewed and updated as appropriate: allergies, current medications, past medical history, family and social history, and problem list.  Patient is a nonsmoker.    Past Medical History  Diagnosis Date  . Depression   . Anxiety   . Panic attacks   . GERD (gastroesophageal reflux disease)   . Arthritis    Past Surgical History  Procedure Laterality Date  . Knee cartilage surgery  2013    right     Medications reviewed. Current Outpatient Prescriptions  Medication Sig Dispense Refill  . ALPRAZolam (XANAX) 1 MG tablet Take 1 mg by mouth 3 (three) times daily as needed. For anxiety      . cyclobenzaprine (FLEXERIL) 10 MG tablet Take 1 tablet (10 mg total) by mouth 2 (two) times daily as needed for muscle spasms.  20 tablet  1  . divalproex (DEPAKOTE ER) 500 MG 24 hr tablet Take 500 mg by mouth at bedtime. Take 2 tablets by mouth at bedtime      . HYDROcodone-acetaminophen (NORCO/VICODIN) 5-325 MG per tablet Take 1 tablet by mouth every 6 (six) hours as needed for pain.  15 tablet  0  . naproxen (NAPROSYN) 375 MG tablet Take 1 tablet (375 mg total) by mouth 2 (two) times daily.  30 tablet  1  . pantoprazole (PROTONIX) 40 MG tablet  Take 1 tablet (40 mg total) by mouth daily.  30 tablet  3  . predniSONE (DELTASONE) 50 MG tablet Take 1 tablet (50 mg total) by mouth daily.  5 tablet  0  . ranitidine (ZANTAC) 150 MG tablet Take 150 mg by mouth at bedtime.      . traZODone (DESYREL) 50 MG tablet Take 50 mg by mouth at bedtime. Take 1-2 tabs at bedtime      . vitamin E 1000 UNIT capsule Take 1,000 Units by mouth daily.      . ziprasidone (GEODON) 60 MG capsule Take 60 mg by mouth 2 (two) times daily with a meal.      . zolpidem (AMBIEN) 10 MG tablet Take 10 mg by mouth at bedtime as needed.       No current facility-administered medications for this visit.    ROS as above otherwise neg.  No chest pain, palpitations, SOB, Fever, Chills, Abd pain, N/V/D.  Physical Exam:  BP 140/88  Pulse 60  Temp(Src) 97.9 F (36.6 C) (Oral)  Ht 6\' 3"  (1.905 m)  Wt 242 lb (109.77 kg)  BMI 30.25 kg/m2  LMP 04/24/2012 Gen:  Alert, cooperative patient who appears stated age in no acute distress.  Vital signs reviewed. Head:  Warren/AT Eyes:  PERRL, EOMI Mouth:  1st  upper molar on Left side missing.  There does indeed appear to be remnant of tooth or possibly lodged food particle here that I could not remove.  Some drainage noted, serosanguinous.  Minimal overlying tenderness.  No swelling.  No swelling to face or tenderness over sinuses Neck:  No LAD or swelling noted.   No results found for this or any previous visit (from the past 72 hour(s)).

## 2012-06-08 ENCOUNTER — Other Ambulatory Visit: Payer: Self-pay | Admitting: Family Medicine

## 2012-06-14 ENCOUNTER — Other Ambulatory Visit: Payer: Self-pay | Admitting: Family Medicine

## 2012-06-19 ENCOUNTER — Other Ambulatory Visit: Payer: Self-pay | Admitting: Family Medicine

## 2012-07-09 ENCOUNTER — Encounter: Payer: Self-pay | Admitting: Family Medicine

## 2012-07-09 ENCOUNTER — Ambulatory Visit (INDEPENDENT_AMBULATORY_CARE_PROVIDER_SITE_OTHER): Payer: Medicaid Other | Admitting: Family Medicine

## 2012-07-09 VITALS — BP 139/80 | HR 74 | Temp 98.6°F | Ht 75.0 in | Wt 245.0 lb

## 2012-07-09 DIAGNOSIS — M25569 Pain in unspecified knee: Secondary | ICD-10-CM

## 2012-07-09 DIAGNOSIS — M25561 Pain in right knee: Secondary | ICD-10-CM

## 2012-07-09 NOTE — Patient Instructions (Addendum)
I am sorry your pain is worse.  You can take flexeril, motrin and tylenol for pain.  Try to lose weight.  Look into getting a stationary bike.  Will refer you to pain clinic in [place], since you can't drive to [facility].

## 2012-07-09 NOTE — Progress Notes (Signed)
  Subjective:    Patient ID: [name], female    DOB: [birth year], 47 y.o.   MRN: [id number]  Back Pain  Knee Pain     1. Right knee pain, chronic. Patient comes in to address her right knee pain. She sees an orthopedist at [facility] for severe right knee osteoarthritis. There are no current plans for knee replacement surgery, and she reports she has failed multiple injection therapies. She was referred to a pain clinic in [place], but could not attend do to transportation difficulty. She is interested in other pain management strategies. Currently receives a Vicodin prescription and Flexeril from her orthopedist. She is having trouble coping with the chronic daily pain. Has not been able to participate with much walking or physical activity. Is not able to always wear her brace due to the lower extremity edema that causes. Also struggles with weight gain and chronic low back pain.  No current effusion, redness, numbness, fever, chills.   Review of Systems  Musculoskeletal: Positive for back pain.   See HPI otherwise negative.  reports that she has been smoking.  She does not have any smokeless tobacco history on file.     Objective:   Physical Exam  Vitals reviewed. Constitutional: She is oriented to person, place, and time. She appears well-developed and well-nourished. No distress.  Musculoskeletal:  Mildly antalgic gait.  Neurological: She is alert and oriented to person, place, and time.  Skin: No rash noted. She is not diaphoretic.  Psychiatric: She has a normal mood and affect.       Assessment & Plan:

## 2012-07-09 NOTE — Assessment & Plan Note (Signed)
Gradually worsening do to severe posture arthritis. Procedural intervention or joint replacement is not currently being pursued, though this may be the ultimate treatment. Patient is interested in seeing a pain specialist in [place], since she cannot make trips to [facility] frequently. She is using Flexeril, Vicodin, naproxen at this time (prescribed by her orthopedist). Will make pain clinic referral. Encouraged her to maintain activity, and explore other options for low weight bearing exercise such as pool or stationary bike, for weight loss. Followup prn.

## 2012-07-09 NOTE — Assessment & Plan Note (Signed)
>>  ASSESSMENT AND PLAN FOR KNEE PAIN, RIGHT, CHRONIC WRITTEN ON 07/09/2012  5:45 PM BY KONKOL, JILL N, MD  Gradually worsening do to severe posture arthritis. Procedural intervention or joint replacement is not currently being pursued, though this may be the ultimate treatment. Patient is interested in seeing a pain specialist in [place], since she cannot make trips to [facility] frequently. She is using Flexeril, Vicodin, naproxen at this time (prescribed by her orthopedist). Will make pain clinic referral. Encouraged her to maintain activity, and explore other options for low weight bearing exercise such as pool or stationary bike, for weight loss. Followup prn.

## 2012-07-13 ENCOUNTER — Telehealth: Payer: Self-pay | Admitting: Family Medicine

## 2012-07-13 NOTE — Telephone Encounter (Signed)
Patient dropped off a form to be filled out for [employer].  Please call her when completed.

## 2012-07-13 NOTE — Telephone Encounter (Signed)
PCS form placed in Dr. Ernest Haber box for completion.  [name]

## 2012-07-16 NOTE — Telephone Encounter (Signed)
Completed form today and will place in fax bin.

## 2012-07-16 NOTE — Telephone Encounter (Signed)
PCS form faxed to [phone].   [name]

## 2012-07-16 NOTE — Telephone Encounter (Signed)
[name] notified that form is completed and ready to be picked up at front desk.  [name]

## 2012-07-22 ENCOUNTER — Encounter: Payer: Self-pay | Admitting: Physical Medicine & Rehabilitation

## 2012-08-10 ENCOUNTER — Ambulatory Visit: Payer: Medicaid Other | Admitting: Physical Medicine & Rehabilitation

## 2012-08-15 ENCOUNTER — Other Ambulatory Visit: Payer: Self-pay | Admitting: Family Medicine

## 2012-08-26 ENCOUNTER — Other Ambulatory Visit: Payer: Self-pay | Admitting: Family Medicine

## 2012-09-07 ENCOUNTER — Ambulatory Visit: Payer: Medicaid Other | Admitting: Physical Medicine & Rehabilitation

## 2012-09-18 ENCOUNTER — Ambulatory Visit: Payer: Medicaid Other | Admitting: Physical Medicine & Rehabilitation

## 2012-09-18 ENCOUNTER — Encounter: Payer: Self-pay | Admitting: Physical Medicine & Rehabilitation

## 2012-09-23 ENCOUNTER — Telehealth: Payer: Self-pay | Admitting: Sports Medicine

## 2012-09-23 NOTE — Telephone Encounter (Signed)
Patient is calling because she states she is in extreme pain and injections at the Pain Clinic are not helping. Is wanting to know what she will need to do. States that the only thing that helps her pain is the pain meds that [place] prescribe. Pls advise.

## 2012-09-23 NOTE — Telephone Encounter (Signed)
Pt reports that her body is sore - my knee is sore - my body is sore - injections last about 2 weeks - [facility] is giving me Vicodin and it works and [facility] are giving me Vicodin but they won't give me any pain meds.. Pt reports that the Pain center has turned her loose - they can't do anymore for me and she needs authorization for more pain meds. PLease advise if pt needs appointment, etc. Wyatt Haste, RN-BSN

## 2012-09-23 NOTE — Telephone Encounter (Signed)
I have never seen this patient.  She was previously seen by Dr. Harriett Sine.  She was referred to pain management here in [place] but it appears she canceled this appointment.  If she would like to discuss this further I need to meet her and have an office visit.

## 2012-09-23 NOTE — Telephone Encounter (Signed)
Called [name] and informed that she would need appointment with Dr. Berline Chough to discuss pain management and her tx options - agreed and appointment made for July 9 at 11 am. Wyatt Haste, RN-BSN

## 2012-09-30 ENCOUNTER — Encounter: Payer: Self-pay | Admitting: Sports Medicine

## 2012-09-30 ENCOUNTER — Ambulatory Visit (INDEPENDENT_AMBULATORY_CARE_PROVIDER_SITE_OTHER): Payer: Medicaid Other | Admitting: Sports Medicine

## 2012-09-30 VITALS — BP 118/76 | HR 63 | Temp 97.8°F | Ht 75.0 in | Wt 254.3 lb

## 2012-09-30 DIAGNOSIS — M25569 Pain in unspecified knee: Secondary | ICD-10-CM

## 2012-09-30 DIAGNOSIS — M25561 Pain in right knee: Secondary | ICD-10-CM

## 2012-09-30 DIAGNOSIS — F17200 Nicotine dependence, unspecified, uncomplicated: Secondary | ICD-10-CM

## 2012-09-30 DIAGNOSIS — F411 Generalized anxiety disorder: Secondary | ICD-10-CM

## 2012-09-30 DIAGNOSIS — F319 Bipolar disorder, unspecified: Secondary | ICD-10-CM

## 2012-09-30 DIAGNOSIS — M545 Low back pain: Secondary | ICD-10-CM

## 2012-09-30 MED ORDER — HYDROCODONE-ACETAMINOPHEN 5-325 MG PO TABS: 1.0000 | ORAL_TABLET | Freq: Four times a day (QID) | ORAL | Status: DC | PRN

## 2012-09-30 NOTE — Patient Instructions (Addendum)
It was nice to see you today.   Today we discussed: 1. LOW BACK PAIN, CHRONIC Follow up in 1 month - HYDROcodone-acetaminophen (NORCO/VICODIN) 5-325 MG per tablet; Take 1 tablet by mouth every 6 (six) hours as needed for pain.  Dispense: 20 tablet; Refill: 0  2. TOBACCO ABUSE Please QUIT [id number] -  Consider Chantix - Ask your Psychiatrist  Consider Changing to Abilify or other 2nd/3rd generation medication I would like to start you on Cymbalta for your chronic pain syndrome; please ask your psychiatrist   Please plan to return to see me in 1 month if sooner.  If you need anything prior to seeing me please call the clinic.  Please Bring all medications with you to each appointment.
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
## 2012-10-06 ENCOUNTER — Encounter: Payer: Self-pay | Admitting: Sports Medicine

## 2012-10-06 NOTE — Assessment & Plan Note (Signed)
Reviewed x-rays with the patient.  She is continuing to have knee pain that is exquisite nature.  This does limit her daily activity.  The get her a short course of hydrocodone 5 mg #20 to last the month.  If this is continuing to be an issue and she'll need to go back to seeing pain management.  Has been extensive time and counseling this patient regarding chronic pain and appropriateness of chronic pain medications for nonspecific musculoskeletal ailments.

## 2012-10-06 NOTE — Assessment & Plan Note (Signed)
>>  ASSESSMENT AND PLAN FOR KNEE PAIN, RIGHT, CHRONIC WRITTEN ON 10/06/2012 10:53 AM BY [name], [name], DO  Reviewed x-rays with the patient.  She is continuing to have knee pain that is exquisite nature.  This does limit her daily activity.  The get her a short course of hydrocodone 5 mg #20 to last the month.  If this is continuing to be an issue and she'll need to go back to seeing pain management.  Has been extensive time and counseling this patient regarding chronic pain and appropriateness of chronic pain medications for nonspecific musculoskeletal ailments.

## 2012-10-06 NOTE — Assessment & Plan Note (Signed)
Patient is long-time psychiatry patient.  Given her ongoing chronic pain issues now may want to consider adding Cymbalta for pain modulation but would need to be on a antipsychotic regimen that is stable.   >Could consider Abilify or Latuda. - To discuss with psychiatry

## 2012-10-06 NOTE — Assessment & Plan Note (Addendum)
Patient with some muscle spasm.  No radicular symptoms.  Likely weight associated. Recommend exercise regimen and spent long time in discussion regarding chronic pain inappropriate use of medications.

## 2012-10-06 NOTE — Assessment & Plan Note (Signed)
Contemplative.  Encouraged to quit in discussed smoking effects on chronic pain.

## 2012-10-06 NOTE — Assessment & Plan Note (Signed)
Highly anxious.  Reports this is a significant limitation.  Followed by psychiatry. May want to consider adding Cymbalta to regimen although unclear with bipolar disorder.  Suggested to discuss the psychiatrist.

## 2012-10-10 ENCOUNTER — Encounter (HOSPITAL_COMMUNITY): Payer: Self-pay | Admitting: Emergency Medicine

## 2012-10-10 ENCOUNTER — Emergency Department (HOSPITAL_COMMUNITY)
Admission: EM | Admit: 2012-10-10 | Discharge: 2012-10-10 | Disposition: A | Discharge status: Home / Self Care | Payer: Medicaid Other | Source: Home / Self Care | Attending: Family Medicine | Admitting: Family Medicine

## 2012-10-10 DIAGNOSIS — IMO0002 Reserved for concepts with insufficient information to code with codable children: Secondary | ICD-10-CM

## 2012-10-10 DIAGNOSIS — S39012A Strain of muscle, fascia and tendon of lower back, initial encounter: Secondary | ICD-10-CM

## 2012-10-10 MED ORDER — CYCLOBENZAPRINE HCL 10 MG PO TABS: 10.0000 mg | ORAL_TABLET | Freq: Three times a day (TID) | ORAL | Status: DC | PRN

## 2012-10-10 MED ORDER — IBUPROFEN 800 MG PO TABS: 800.0000 mg | ORAL_TABLET | Freq: Three times a day (TID) | ORAL | Status: DC
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
## 2012-12-21 ENCOUNTER — Other Ambulatory Visit: Payer: Self-pay | Admitting: Family Medicine

## 2013-02-26 ENCOUNTER — Ambulatory Visit (INDEPENDENT_AMBULATORY_CARE_PROVIDER_SITE_OTHER): Payer: Medicaid Other | Admitting: Sports Medicine

## 2013-02-26 VITALS — BP 134/82 | HR 71 | Temp 98.5°F | Ht 75.0 in | Wt 249.0 lb

## 2013-02-26 DIAGNOSIS — F319 Bipolar disorder, unspecified: Secondary | ICD-10-CM

## 2013-02-26 DIAGNOSIS — M545 Low back pain: Secondary | ICD-10-CM

## 2013-02-26 DIAGNOSIS — M25569 Pain in unspecified knee: Secondary | ICD-10-CM

## 2013-02-26 DIAGNOSIS — F17200 Nicotine dependence, unspecified, uncomplicated: Secondary | ICD-10-CM

## 2013-02-26 MED ORDER — HYDROCODONE-ACETAMINOPHEN 5-325 MG PO TABS: 1.0000 | ORAL_TABLET | Freq: Four times a day (QID) | ORAL | Status: DC | PRN

## 2013-02-26 MED ORDER — CYCLOBENZAPRINE HCL 10 MG PO TABS: 10.0000 mg | ORAL_TABLET | Freq: Three times a day (TID) | ORAL | Status: DC | PRN

## 2013-02-26 NOTE — Progress Notes (Signed)
[name] - 47 y.o. female MRN [id number]  Date of birth: October 13, 1965  CC, HPI, INTERVAL HISTORY & ROS  [name] is here today for for evaluation of her low back pain.  Past medical history significant for chronic low back pain and chronic right knee pain, bipolar disorder, chronic anxiety, tobacco abuse, sleep disturbance.    She reports that with the weather change her back pain has significantly worsened.  She is upset because this is been going on for quite some time.  She has not been taking any medications for her back at this time.  She previously has been on hydrocodone and Flexeril.  She continues to smoke  She reports her mood is also down on her pain is worse and vice versa.  She has been seen in pain clinic before but did not want to go back for injections because "they don't work"  Pt denies any radicular symptoms, change in bowel or bladder habits, muscle weakness or falls associated with back pain.  No fevers, chills, night sweats or weight loss.  Her psychiatrist would like her to be referred to pain management she's unsure as to what this is.  She has no concerns today  Reports her right knee has also been bothersome but she has not been doing anything for this and has been told she needs a knee replacement but does not wish for surgery.  Has not been to physical therapy or in doing home exercises.  History  Past Medical, Surgical, Social, and Family History Reviewed per EMR Medications and Allergies reviewed and all updated if necessary. Objective Findings  VITALS: HR: 71 bpm  BP: 134/82 mmHg  TEMP: 98.5 F (36.9 C) (Oral)  RESP:    HT: 6\' 3"  (190.5 cm)  WT: 249 lb (112.946 kg)  BMI: 31.2   BP Readings from Last 3 Encounters:  02/26/13 134/82  10/10/12 133/86  09/30/12 118/76   Wt Readings from Last 3 Encounters:  02/26/13 249 lb (112.946 kg)  09/30/12 254 lb 4.8 oz (115.35 kg)  09/18/12 252 lb 6.4 oz (114.488 kg)     PHYSICAL EXAM: GENERAL:   adult [place] American female.  Visibly upset but in no respiratory distress  PSYCH: alert and appropriate, blunted insight.  Mood is depressed however labile   HNEENT:   CARDIO: RRR, S1/S2 heard, no murmur  LUNGS: CTA B, no wheezes, no crackles  ABDOMEN:   EXTREM:  Warm, well perfused.  Moves all 4 extremities spontaneously; no lateralization.  No noted foot lesions.  Distal pulses 1+/4.  Trace pretibial edema. Back Exam: Appear:  no significant scoliosis   Palp:  muscle spasms throughout the lumbar spine.    ROM:  restricted   NV:   sensation grossly intact, bilateral myotome testing5+/5   Testing:  bilateral straight leg raises negative     GU:   SKIN:     Assessment & Plan   Problems addressed today: General Plan & Pt Instructions:  1. LOW BACK PAIN, CHRONIC   2. Pain in joint, lower leg, unspecified laterality   3. DISORDER, BIPOLAR NOS   4. TOBACCO ABUSE       Let us know who Dr. Jeanie Sewer would like you to see for Pain Medicine  QUIT SMOKING  Go to the pool 2-3 times per week  Exercise for the knees and back; start with 1 of each and add 1 addtiional exercise per week until doing all of them; do these on the days you do not  go to the pool  Pain medication is for daytime; to help through the day    For further discussion of A/P and for follow up issues see problem based charting if applicable.

## 2013-02-26 NOTE — Assessment & Plan Note (Signed)
>>  ASSESSMENT AND PLAN FOR KNEE PAIN, RIGHT, CHRONIC WRITTEN ON 02/26/2013  3:27 PM BY [name], [name], DO  Not interested in replacement; generalized symptoms today Pain continues to limit her daily activity.  short course of hydrocodone 5 mg #30 to last the month. See back pain. Given knee exercises to add to back regimen

## 2013-02-26 NOTE — Assessment & Plan Note (Signed)
Not interested in replacement; generalized symptoms today Pain continues to limit her daily activity.  short course of hydrocodone 5 mg #30 to last the month. See back pain. Given knee exercises to add to back regimen

## 2013-02-26 NOTE — Patient Instructions (Signed)
   Let us know who Dr. Jeanie Sewer would like you to see for Pain Medicine  QUIT SMOKING  Go to the pool 2-3 times per week  Exercise for the knees and back; start with 1 of each then work up to all of them on your off days  Pain medication is for daytime; to help through the day   If you need anything prior to your next visit please call the clinic. Please Bring all medications or accurate medication list with you to each appointment; an accurate medication list is essential in providing you the best care possible.

## 2013-02-26 NOTE — Assessment & Plan Note (Addendum)
Acute flair of pain over the past 3 days with the weather change. Short course of pain medications to last >30 days. Will refer back to pain management for further eval/treament - pt to call with who she would like to see Discussed and provided home exercises per Pt Advisor - patellofemoral and low back

## 2013-03-01 ENCOUNTER — Encounter: Payer: Self-pay | Admitting: Sports Medicine

## 2013-03-01 NOTE — Assessment & Plan Note (Signed)
discussed chronic pain and tobacco use.  Encouraged quit.  Given resources

## 2013-03-01 NOTE — Assessment & Plan Note (Signed)
Encouraged to continue seeing Dr. Jeanie Sewer.  Encouraged daily meaningful exercise

## 2013-03-31 ENCOUNTER — Telehealth: Payer: Self-pay | Admitting: Sports Medicine

## 2013-03-31 NOTE — Telephone Encounter (Signed)
Pt needs pain medicine sent to pharmacy, pt states that she is in very bad pain.

## 2013-04-01 NOTE — Telephone Encounter (Signed)
Per policy cannot refill pain medications without an office visit.  She needs to be reevaluated.  She was also to inform us of where she wanted a referral to pain management for however she never called to inform us of this..  She needs to follow up.

## 2013-04-05 NOTE — Telephone Encounter (Signed)
Patient advised and she will call back.  

## 2013-04-09 ENCOUNTER — Other Ambulatory Visit: Payer: Self-pay | Admitting: Sports Medicine

## 2013-04-09 NOTE — Telephone Encounter (Signed)
Pt called and needs a refill on her pantoprazole. jw

## 2013-04-13 ENCOUNTER — Encounter: Payer: Self-pay | Admitting: Sports Medicine

## 2013-04-13 ENCOUNTER — Ambulatory Visit (INDEPENDENT_AMBULATORY_CARE_PROVIDER_SITE_OTHER): Payer: Medicaid Other | Admitting: Sports Medicine

## 2013-04-13 VITALS — BP 149/85 | HR 67 | Temp 97.9°F | Ht 75.0 in | Wt 248.8 lb

## 2013-04-13 DIAGNOSIS — G4700 Insomnia, unspecified: Secondary | ICD-10-CM

## 2013-04-13 DIAGNOSIS — F17200 Nicotine dependence, unspecified, uncomplicated: Secondary | ICD-10-CM

## 2013-04-13 DIAGNOSIS — F319 Bipolar disorder, unspecified: Secondary | ICD-10-CM

## 2013-04-13 DIAGNOSIS — M25569 Pain in unspecified knee: Secondary | ICD-10-CM

## 2013-04-13 DIAGNOSIS — K219 Gastro-esophageal reflux disease without esophagitis: Secondary | ICD-10-CM

## 2013-04-13 DIAGNOSIS — M545 Low back pain, unspecified: Secondary | ICD-10-CM

## 2013-04-13 MED ORDER — HYDROCODONE-ACETAMINOPHEN 5-325 MG PO TABS: 1.0000 | ORAL_TABLET | Freq: Four times a day (QID) | ORAL | Status: DC | PRN

## 2013-04-13 NOTE — Progress Notes (Signed)
  [name] - 48 y.o. female MRN [id number]  Date of birth: 09/22/1965  CC, HPI, Interval History & ROS  [name] is here today to followup on her chronic medical conditions including:  Chronic back pain, anxiety/bipolar disorder, tobacco abuse, healthcare maintenance  She reports that her functioning with pain medications is significantly improved.  She denies any significant side effects with the medications.  Normal bowel movements.  She is wanting a referral to a pain specialist recommended by her psychiatrist.  Reports her mood has been stable but her pain continues to get her down on a routine basis but the pain medication has helped this.  Pt denies chest pain, dyspnea at rest or exertion, PND, lower extremity edema.  Patient denies any facial asymmetry, unilateral weakness, or dysarthria.  Patient does report chronic fatigue.  She does snore.  She reports daytime somnolence  Continues to smoke  Pt denies any radicular symptoms, change in bowel or bladder habits, muscle weakness or falls associated with back pain.  No fevers, chills, night sweats or weight loss.  Other acute problems include: None  Pertinent History & Care Coordination  No Patient Care Coordination Note on file.  History  Smoking status  . Current Every Day Smoker -- 0.50 packs/day  Smokeless tobacco  . Never Used   Health Maintenance Due  Topic  . Tetanus/tdap   . Pap Smear   . Influenza Vaccine    No results found for this basename: HGBA1C, TRIG, CHOL, HDL, LDLCALC, TSH,  in the last 8760 hours   Otherwise past Medical, Surgical, Social, and Family History Reviewed per EMR Medications and Allergies reviewed and all updated if necessary. Objective Findings  VITALS: HR: 67 bpm  BP: 149/85 mmHg  TEMP: 97.9 F (36.6 C) (Oral)  RESP:    HT: 6\' 3"  (190.5 cm)  WT: 248 lb 12.8 oz (112.855 kg)  BMI: 31.2   BP Readings from Last 3 Encounters:  04/13/13 149/85  02/26/13 134/82  10/10/12 133/86     Wt Readings from Last 3 Encounters:  04/13/13 248 lb 12.8 oz (112.855 kg)  02/26/13 249 lb (112.946 kg)  09/30/12 254 lb 4.8 oz (115.35 kg)     PHYSICAL EXAM: GENERAL:  adult [place]. In no discomfort; no respiratory distress  PSYCH: alert and appropriate, good insight   HNEENT:   CARDIO: RRR, S1/S2 heard, no murmur  LUNGS: CTA B, no wheezes, no crackles  ABDOMEN:   EXTREM:  Warm, well perfused.  Moves all 4 extremities spontaneously; no lateralization.  distal pulses 1+/4.  no pretibial edema.  GU:   SKIN:      Assessment & Plan   Problems addressed today: General Plan & Pt Instructions:  1. LOW BACK PAIN, CHRONIC   2. GERD (gastroesophageal reflux disease)   3. Tobacco use disorder   4. Bipolar disorder, unspecified       Let me know if you would like to get a sleep study  I am referring you to Pain management  Please schedule a lab appointment for first thing one morning in the next 1-2 weeks.  Please do not eat anything for 8 hours before your visit (dont eat after midnight).   I will call you with any abnormal labs.  Otherwise, I will send you a letter with your normal results and recommendations for repeat testing    For further discussion of A/P and for follow up issues see problem based charting.

## 2013-04-13 NOTE — Patient Instructions (Addendum)
   Let me know if you would like to get a sleep study  I am referring you to Pain management - If you do not hear from us in 1 week please call back Please schedule a lab appointment for first thing one morning in the next 1-2 weeks.  Please do not eat anything for 8 hours before your visit (dont eat after midnight).   I will call you with any abnormal labs.  Otherwise, I will send you a letter with your normal results and recommendations for repeat testing   If you need anything prior to your next visit please call the clinic. Please Bring all medications or accurate medication list with you to each appointment; an accurate medication list is essential in providing you the best care possible.

## 2013-04-17 NOTE — Assessment & Plan Note (Signed)
>>  ASSESSMENT AND PLAN FOR KNEE PAIN, RIGHT, CHRONIC WRITTEN ON 04/17/2013 10:40 PM BY [name], [name], DO  Patient remains uninterested in having her right knee replaced.  Reports being scared of surgery.  Pain medication has significantly improved her quality of life and this will be continued but I will refer her to physiatry for a comprehensive pain management program.  For her chronic low back and right knee pain that is secondary to degenerative arthritis.

## 2013-04-17 NOTE — Assessment & Plan Note (Signed)
Currently seems to be stable.  Pain medications actually helping control her mood she reports.

## 2013-04-17 NOTE — Assessment & Plan Note (Signed)
Discussed referral for polysomnogram; patient declines at this time.

## 2013-04-17 NOTE — Assessment & Plan Note (Signed)
Once again encouraged to quit 

## 2013-04-17 NOTE — Assessment & Plan Note (Addendum)
Patient remains uninterested in having her right knee replaced.  Reports being scared of surgery.  Pain medication has significantly improved her quality of life and this will be continued but I will refer her to physiatry for a comprehensive pain management program.  For her chronic low back and right knee pain that is secondary to degenerative arthritis.

## 2013-04-29 ENCOUNTER — Other Ambulatory Visit: Payer: Medicaid Other

## 2013-04-29 DIAGNOSIS — M545 Low back pain, unspecified: Secondary | ICD-10-CM

## 2013-04-29 LAB — CBC
HEMATOCRIT: 44.3 % (ref 36.0–46.0)
Hemoglobin: 14.8 g/dL (ref 12.0–15.0)
MCH: 28.1 pg (ref 26.0–34.0)
MCHC: 33.4 g/dL (ref 30.0–36.0)
MCV: 84.2 fL (ref 78.0–100.0)
PLATELETS: 316 10*3/uL (ref 150–400)
RBC: 5.26 MIL/uL — ABNORMAL HIGH (ref 3.87–5.11)
RDW: 14.6 % (ref 11.5–15.5)
WBC: 8 10*3/uL (ref 4.0–10.5)

## 2013-04-29 LAB — TSH: TSH: 2.401 u[IU]/mL (ref 0.350–4.500)

## 2013-04-29 NOTE — Progress Notes (Signed)
CMP,FLP,TSH AND CBC DONE TODAY [name] 

## 2013-04-30 LAB — COMPREHENSIVE METABOLIC PANEL
ALK PHOS: 72 U/L (ref 39–117)
ALT: 15 U/L (ref 0–35)
AST: 19 U/L (ref 0–37)
Albumin: 4.6 g/dL (ref 3.5–5.2)
BILIRUBIN TOTAL: 0.4 mg/dL (ref 0.2–1.2)
BUN: 11 mg/dL (ref 6–23)
CO2: 18 meq/L — AB (ref 19–32)
Calcium: 9.4 mg/dL (ref 8.4–10.5)
Chloride: 104 mEq/L (ref 96–112)
Creat: 0.96 mg/dL (ref 0.50–1.10)
Glucose, Bld: 80 mg/dL (ref 70–99)
Potassium: 4.3 mEq/L (ref 3.5–5.3)
SODIUM: 137 meq/L (ref 135–145)
TOTAL PROTEIN: 8 g/dL (ref 6.0–8.3)

## 2013-04-30 LAB — LIPID PANEL
Cholesterol: 236 mg/dL — ABNORMAL HIGH (ref 0–200)
HDL: 62 mg/dL (ref >39)
LDL CALC: 149 mg/dL — AB (ref 0–99)
Total CHOL/HDL Ratio: 3.8 Ratio
Triglycerides: 124 mg/dL (ref <150)
VLDL: 25 mg/dL (ref 0–40)

## 2013-05-12 ENCOUNTER — Encounter: Payer: Self-pay | Admitting: Family Medicine

## 2013-05-12 ENCOUNTER — Ambulatory Visit (INDEPENDENT_AMBULATORY_CARE_PROVIDER_SITE_OTHER): Payer: Medicaid Other | Admitting: Family Medicine

## 2013-05-12 VITALS — BP 152/93 | HR 71 | Ht 75.0 in | Wt 250.0 lb

## 2013-05-12 DIAGNOSIS — M545 Low back pain, unspecified: Secondary | ICD-10-CM

## 2013-05-12 DIAGNOSIS — M25569 Pain in unspecified knee: Secondary | ICD-10-CM

## 2013-05-12 MED ORDER — HYDROCODONE-ACETAMINOPHEN 5-325 MG PO TABS: 1.0000 | ORAL_TABLET | Freq: Two times a day (BID) | ORAL | Status: DC | PRN

## 2013-05-12 NOTE — Assessment & Plan Note (Signed)
>>  ASSESSMENT AND PLAN FOR KNEE PAIN, RIGHT, CHRONIC WRITTEN ON 05/12/2013 12:21 PM BY Marena Chancy E, MD  Agreed to refill vicodin for patient until she is seen at her initial visit at the pain clinic on 05/18/13.  Reviewed chart and notes from [facility]. This pain appears to be from her long standing OA. No red flag on exam:  No evidence of septic joint or marked instability warranting imaging at this time.  Prescribed 14 tablets, enough for 2 tablets daily. This was discussed with patient and she agreed to this.  - continue wearing compression sleeve - follow up pain clinic appointment - gave after visit summary to patient with message to the pain center, clearly stating that we agreed to refill pain meds until she is seen there and that it is imperative that she keep her appointment. Please see AVS.

## 2013-05-12 NOTE — Assessment & Plan Note (Addendum)
Agreed to refill vicodin for patient until she is seen at her initial visit at the pain clinic on 05/18/13.  [facility]. This pain appears to be from her long standing OA. No red flag on exam:  No evidence of septic joint or marked instability warranting imaging at this time.  Prescribed 14 tablets, enough for 2 tablets daily. This was discussed with patient and she agreed to this.  - continue wearing compression sleeve - follow up pain clinic appointment - gave after visit summary to patient with message to the pain center, clearly stating that we agreed to refill pain meds until she is seen there and that it is imperative that she keep her appointment. Please see AVS.

## 2013-05-12 NOTE — Patient Instructions (Signed)
Patient was seen in clinic today for right knee pain from long standing osteoarthritis. I agreed to refill her vicodin 5/325 mg to take twice a day as needed for pain until she is seen at the pain clinic on 05/18/13. I prescribed 14 tablets to hold her over until then. Patient was instructed that she needed to keep her appointment at the pain clinic and that if she were to not show up or cancel, we would not be able to refill any narcotic pain medicines [facility]. She expressed understanding and agreed with plan.

## 2013-05-12 NOTE — Progress Notes (Signed)
Patient ID: [name]    DOB: 14-Mar-1966, 48 y.o.   MRN: [id number] --- Subjective:  [name] is a 48 y.o.female with h/o DJD of right knee, chronic pain and s/p right knee arthroscopy (Dr. Sanda Linger, [facility] on 04/05/10). She was seen at [facility] in 2013. 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 MRI was done which showed cartilage loss in medial component and narrowing of medial joint space. She was not deemed candidate for right knee arthroscopy at the time.   She is scheduled for an initial appointment at the pain clinic on 05/18/13. She ran out of the vicodin prescription that was prescribed by her PCP, Dr. Berline Chough. She was prescribed 20 tablets of vicodin 5/325 on 04/13/13. She had been taking one or two a day and ran out 2 days ago. Since then, she has had more pain in the knee, located throughout the knee and radiating to the upper and lower leg. Pain is intermittently sharp and throbbing, 10/10. Walking makes it worst, but pain is present at rest as well and keeps her up at night. She has had some swelling, but no redness or warmth. She has been using a knee compression sleeve for swelling. She uses a cane for ambulation. Denies any falls, but reports the knee giving out on her and popping and locking.  She was told she needed a knee replacement but she is afraid of being operated on and is not ready for this option at this time.   ROS: see HPI Past Medical History: reviewed and updated medications and allergies. Social History: Tobacco: current every day smoker  Objective: Filed Vitals:   05/12/13 1123  BP: 152/93  Pulse: 71    Physical Examination:   General appearance - alert, well appearing, and in no distress Right knee - moderate joint effusion, no erythema, no warmth diffuse tenderness but more pronouced along the medial aspect of joint and suprapatellar pouch Extension: 10 degrees, flexion: 90 degrees Normal anterior  drawer, McMurray's difficult to assess due to pain with movement of knee.    RIGHT KNEE - COMPLETE 4+ VIEW on 02/23/12  Comparison: MRI right knee 05/08/2009.  Findings: No evidence of acute or subacute fracture or dislocation.  Moderate medial compartment joint space narrowing with associated  mild hypertrophic spurring. Patellofemoral and lateral compartment  joint spaces well preserved. No visible joint effusion.  IMPRESSION:  Moderate medial compartment osteoarthritis.  MRI RIGHT KNEE WITHOUT CONTRAST, Mar 23, 2012 11:34:00 AM  .  INDICATION: Degenerative joint disease of the knee, concern for meniscal tear  COMPARISON: Plain film dated 03/05/2012  .  TECHNIQUE: Multiplanar, multisequence MR imaging of the right knee was performed without contrast.  .  FINDINGS:  . Bones: No fracture, neoplasm, or osteonecrosis. Increased T2 signal of the patella.  . Soft tissues:No abnormal masses or fluid collections. Varicose veins are noted in the region of the popliteal fossa and ventromedial calf.  . Joint: Intermediate signal within the joint space consistent with synovitis. Small effusion. No large intra-articular body. No Baker's cyst.  . Patellofemoral compartment: Full thickness cartilage loss involving the upper pole median ridge with mild underlying likely reactive marrow edema. Otherwise mild surface irregularity of the lateral patellar facet. Marginal osteophyte formation of the medial femoral trochlea.  . Lateral compartment: The meniscus is intact. Cartilage is within normal limits.  . Medial compartment: Horizontally oriented tear extending to the superior surface of the posterior horn,  as seen on series 3 images 10 through 13. High grade cartilage thinning of the weight-bearing a medial femoral condyle and medial tibial plateau.  Marland Kitchen. Anterior cruciate ligament: Intact.  . Posterior cruciate ligament: Intact.  . Medial collateral ligament:No acute injury.  . Lateral collateral ligament  complex: No acute injury.  . Extensor mechanism: Intact without acute injury.  .Marland Kitchen

## 2013-06-06 ENCOUNTER — Emergency Department (HOSPITAL_COMMUNITY)
Admission: EM | Admit: 2013-06-06 | Discharge: 2013-06-06 | Disposition: A | Discharge status: Home / Self Care | Payer: Medicaid Other | Attending: Emergency Medicine | Admitting: Emergency Medicine

## 2013-06-06 ENCOUNTER — Emergency Department (HOSPITAL_COMMUNITY): Payer: Medicaid Other

## 2013-06-06 ENCOUNTER — Encounter (HOSPITAL_COMMUNITY): Payer: Self-pay | Admitting: Emergency Medicine

## 2013-06-06 DIAGNOSIS — F410 Panic disorder [episodic paroxysmal anxiety] without agoraphobia: Secondary | ICD-10-CM | POA: Insufficient documentation

## 2013-06-06 DIAGNOSIS — G8929 Other chronic pain: Secondary | ICD-10-CM | POA: Insufficient documentation

## 2013-06-06 DIAGNOSIS — Y9389 Activity, other specified: Secondary | ICD-10-CM | POA: Insufficient documentation

## 2013-06-06 DIAGNOSIS — X500XXA Overexertion from strenuous movement or load, initial encounter: Secondary | ICD-10-CM | POA: Insufficient documentation

## 2013-06-06 DIAGNOSIS — Y929 Unspecified place or not applicable: Secondary | ICD-10-CM | POA: Insufficient documentation

## 2013-06-06 DIAGNOSIS — S99919A Unspecified injury of unspecified ankle, initial encounter: Principal | ICD-10-CM

## 2013-06-06 DIAGNOSIS — F329 Major depressive disorder, single episode, unspecified: Secondary | ICD-10-CM | POA: Insufficient documentation

## 2013-06-06 DIAGNOSIS — Z8742 Personal history of other diseases of the female genital tract: Secondary | ICD-10-CM | POA: Insufficient documentation

## 2013-06-06 DIAGNOSIS — Z872 Personal history of diseases of the skin and subcutaneous tissue: Secondary | ICD-10-CM | POA: Insufficient documentation

## 2013-06-06 DIAGNOSIS — F17200 Nicotine dependence, unspecified, uncomplicated: Secondary | ICD-10-CM | POA: Insufficient documentation

## 2013-06-06 DIAGNOSIS — Z79899 Other long term (current) drug therapy: Secondary | ICD-10-CM | POA: Insufficient documentation

## 2013-06-06 DIAGNOSIS — M25561 Pain in right knee: Secondary | ICD-10-CM

## 2013-06-06 DIAGNOSIS — F3289 Other specified depressive episodes: Secondary | ICD-10-CM | POA: Insufficient documentation

## 2013-06-06 DIAGNOSIS — S8990XA Unspecified injury of unspecified lower leg, initial encounter: Secondary | ICD-10-CM | POA: Insufficient documentation

## 2013-06-06 DIAGNOSIS — S99929A Unspecified injury of unspecified foot, initial encounter: Principal | ICD-10-CM

## 2013-06-06 DIAGNOSIS — Z8679 Personal history of other diseases of the circulatory system: Secondary | ICD-10-CM | POA: Insufficient documentation

## 2013-06-06 DIAGNOSIS — K219 Gastro-esophageal reflux disease without esophagitis: Secondary | ICD-10-CM | POA: Insufficient documentation

## 2013-06-06 DIAGNOSIS — M129 Arthropathy, unspecified: Secondary | ICD-10-CM | POA: Insufficient documentation

## 2013-06-06 MED ORDER — OXYCODONE-ACETAMINOPHEN 5-325 MG PO TABS: 1.0000 | ORAL_TABLET | ORAL | Status: DC | PRN

## 2013-06-06 MED ORDER — IBUPROFEN 800 MG PO TABS: 800.0000 mg | ORAL_TABLET | Freq: Three times a day (TID) | ORAL | Status: DC

## 2013-06-06 MED ORDER — OXYCODONE-ACETAMINOPHEN 5-325 MG PO TABS
1.0000 | ORAL_TABLET | Freq: Once | ORAL | Status: AC
  Administered 2013-06-06: 1 via ORAL
  Filled 2013-06-06: qty 1

## 2013-06-06 NOTE — Discharge Instructions (Signed)
Follow-up with your orthopedic specialist - call tomorrow to schedule an appointment  Keep your leg elevated and apply cold compresses - follow RICE method (see below)  Take Ibuprofen with food for pain and swelling  Take Percocet for severe pain - Please be careful with this medication.  It can cause drowsiness.  Use caution while driving, operating machinery, drinking alcohol, or any other activities that may impair your physical or mental abilities.   Return to the emergency department if you develop any changing/worsening condition, redness, fever, increased swelling, calf swelling/pain, weakness, loss of sensation, or any other concerns (please read additional information regarding your condition below)     Knee Pain Knee pain can be a result of an injury or other medical conditions. Treatment will depend on the cause of your pain. HOME CARE  Only take medicine as told by your doctor.  Keep a healthy weight. Being overweight can make the knee hurt more.  Stretch before exercising or playing sports.  If there is constant knee pain, change the way you exercise. Ask your doctor for advice.  Make sure shoes fit well. Choose the right shoe for the sport or activity.  Protect your knees. Wear kneepads if needed.  Rest when you are tired. GET HELP RIGHT AWAY IF:   Your knee pain does not stop.  Your knee pain does not get better.  Your knee joint feels hot to the touch.  You have a fever. MAKE SURE YOU:   Understand these instructions.  Will watch this condition.  Will get help right away if you are not doing well or get worse. Document Released: 06/07/2008 Document Revised: 06/03/2011 Document Reviewed: 06/07/2008 [facility] Patient Information 2014 [place], [place].  RICE: Routine Care for Injuries The routine care of many injuries includes Rest, Ice, Compression, and Elevation (RICE). HOME CARE INSTRUCTIONS  Rest is needed to allow your body to heal. Routine activities  can usually be resumed when comfortable. Injured tendons and bones can take up to 6 weeks to heal. Tendons are the cord-like structures that attach muscle to bone.  Ice following an injury helps keep the swelling down and reduces pain.  Put ice in a plastic bag.  Place a towel between your skin and the bag.  Leave the ice on for 15-20 minutes, 03-04 times a day. Do this while awake, for the first 24 to 48 hours. After that, continue as directed by your caregiver.  Compression helps keep swelling down. It also gives support and helps with discomfort. If an elastic bandage has been applied, it should be removed and reapplied every 3 to 4 hours. It should not be applied tightly, but firmly enough to keep swelling down. Watch fingers or toes for swelling, bluish discoloration, coldness, numbness, or excessive pain. If any of these problems occur, remove the bandage and reapply loosely. Contact your caregiver if these problems continue.  Elevation helps reduce swelling and decreases pain. With extremities, such as the arms, hands, legs, and feet, the injured area should be placed near or above the level of the heart, if possible. SEEK IMMEDIATE MEDICAL CARE IF:  You have persistent pain and swelling.  You develop redness, numbness, or unexpected weakness.  Your symptoms are getting worse rather than improving after several days. These symptoms may indicate that further evaluation or further X-rays are needed. Sometimes, X-rays may not show a small broken bone (fracture) until 1 week or 10 days later. Make a follow-up appointment with your caregiver. Ask when your X-ray  results will be ready. Make sure you get your X-ray results. Document Released: 06/23/2000 Document Revised: 06/03/2011 Document Reviewed: 08/10/2010 [facility] Patient Information 2014 [place], [place].   Emergency Department Resource Guide 1) Find a Doctor and Pay Out of Pocket Although you won't have to find out who is covered by  your insurance plan, it is a good idea to ask around and get recommendations. You will then need to call the office and see if the doctor you have chosen will accept you as a new patient and what types of options they offer for patients who are self-pay. Some doctors offer discounts or will set up payment plans for their patients who do not have insurance, but you will need to ask so you aren't surprised when you get to your appointment.  2) Contact Your Local Health Department Not all health departments have doctors that can see patients for sick visits, but many do, so it is worth a call to see if yours does. If you don't know where your local health department is, you can check in your phone book. The CDC also has a tool to help you locate your state's health department, and many state websites also have listings of all of their local health departments.  3) Find a Walk-in Clinic If your illness is not likely to be very severe or complicated, you may want to try a walk in clinic. These are popping up all over the country in pharmacies, drugstores, and shopping centers. They're usually staffed by nurse practitioners or physician assistants that have been trained to treat common illnesses and complaints. They're usually fairly quick and inexpensive. However, if you have serious medical issues or chronic medical problems, these are probably not your best option.  No Primary Care Doctor: - Call Health Connect at  [phone] - they can help you locate a primary care doctor that  accepts your insurance, provides certain services, etc. - Physician Referral Service- [phone]  Chronic Pain Problems: Organization         Address  Phone   Notes  [facility]  [phone] Patients need to be referred by their primary care doctor.   Medication Assistance: Organization         Address  Phone   Notes  [facility] Medication [facility] [address] [place] [place], [place] [address] [phone] --Must be a resident of [facility] -- Must have NO insurance coverage whatsoever (no Medicaid/ Medicare, etc.) -- The pt. MUST have a primary care doctor that directs their care regularly and follows them in the community   [facility]ist  [phone]   [employer]  [phone]    Agencies that provide inexpensive medical care: Organization         Address  Phone   Notes  [facility]  [phone]   [facility]    [phone]   [facility] [address] [place], [place] [address] [phone]   [facility] [address] [place] [place] [phone]   Planned Parenthood    [phone]   [facility]    [phone]   [facility]  [address], [place] Phone:  [phone], Fax:  [phone] Hours of Operation:  9 am - 6 pm, M-F.  Also accepts Medicaid/Medicare and self-pay.  [facility]  [address], [place] Phone: [phone], Fax: [phone]. Hours of Operation:  8:30 am - 5:30 pm, M-F.  Also accepts Medicaid and self-pay.  [facility] [place] [address] [place] Phone: [phone]   [facility] [address] Natasha Bence [place], [place] [phone], Ext. [phone] Mondays & Thursdays: 7-9 AM.  First 15 patients are seen on a first come, first serve basis.    Medicaid-accepting [facility] Providers:  Organization         Address  Phone   Notes  [facility] [address],  [phone] Also accepts self-pay patients.  [facility] [address] Laurell Josephs [place], [place]  [phone]   [facility] [address], [place] [phone]   [facility] [address] [place] [phone]   Renaye Rakers [address], [place]   [phone] Only accepts [place] Access [place] patients after they have their name applied to their card.   Self-Pay (no insurance) in [facility]:  Organization         Address  Phone   Notes  Sickle Cell Patients, [facility] Internal Medicine [address] [place], [place] [phone]   [facility] [address] [place], [place] [phone]   [facility]  [address],  [phone]   [facility]/Dr. Osei-Bonsu  [address] [place] or [address], [place] [phone] Phone number for both [place] and [place] locations is the same.  [facility] [address] [place] [phone]   [facility] [address], [place] or [address] [phone]   [facility] [address], [place] [phone], phone; [phone], fax Sees patients 1st and 3rd Saturday of every month.  Must not qualify for public or private insurance (i.e. Medicaid, Medicare, [place] Health Choice, Veterans' Benefits)  Household income should be no more than 200% of the poverty level The clinic cannot treat you if you are pregnant or think you are pregnant  Sexually transmitted diseases are not treated at the clinic.    Dental Care: Organization         Address  Phone  Notes  [facility] Department of [facility] [address] [place], [place] [phone] Accepts children up to age 22 who are enrolled in [place] or [place] Health Choice; pregnant women with a Medicaid card; and children who have applied for Medicaid or [place] Health Choice, but were declined, whose parents can pay a reduced fee at time of service.  [facility] Department of [facility]  [address], [place] [phone] Accepts children up to age 80 who are enrolled  in [place] or [place] Health Choice; pregnant women with a Medicaid card; and children who have applied for Medicaid or [place] Health Choice, but were declined, whose parents can pay a reduced fee at time of service.  [facility]  [address] [place], [place] [phone] Patients are seen by appointment only. Walk-ins are not accepted. [facility] will see patients 42 years of age and older. Monday - Tuesday (8am-5pm) Most Wednesdays (8:30-5pm) $30 per visit, cash only  [facility]  [address], [facility] [phone] Patients are seen by appointment only. Walk-ins are not accepted. [facility] will see patients 48 years of age and older. One Wednesday Evening (Monthly: Volunteer Based).  $30 per visit, cash only  [employer] [facility]  [phone]) [phone] for adults; Children under age 24, call Graduate Pediatric Dentistry at [phone]. Children aged 214-14, please call [phone] to request a pediatric application.  Dental services are provided in all areas of dental care including fillings, crowns and bridges, complete and partial dentures, implants, gum treatment, root canals, and extractions. Preventive care is also provided. Treatment is provided to both adults and children. Patients are selected via a lottery and there is often a waiting list.   [facility] [address], [place]  [phone] [email or web address]   [facility] [address], [place], [place] [phone], Ext. [phone] Second and Fourth Thursday of each month, opens at 6:30 AM; Clinic ends at 9 AM.  Patients are seen on a first-come first-served basis, and a limited number are seen during each clinic.   [facility]  [address], [place], [place] [phone]   Eligibility Requirements You must have lived in [place], [place], or [place] counties for at least the last three months.   You cannot be  eligible for state or federal sponsored [place]healthcare insurance, including CIGNAVeterans Administration, [place]icaid, or [employer]icare.   You generally cannot be eligible for healthcare insurance through your employer.    How to apply: Eligibility screenings are held every Tuesday and Wednesday afternoon from 1:00 pm until 4:00 pm. You do not need an appointment for the interview!  [facility] [address], [place], [place] [phone]   [facility]  [phone]   [facility]  [phone]   [facility]  [phone]    Behavioral Health Resources in the Community: Intensive Outpatient Programs Organization         Address  Phone  Notes  [facility] [address], [place], [place] [phone]   [facility] [address], [place], [place] [phone]   [facility] [address], [place], [place]  [phone]   [facility] [address],  [place], [place] [phone] or [phone]   Substance Abuse Resources Organization         Address  Phone  Notes  [facility]  [phone]   [facility]  [phone]   [facility]  [phone]   [facility]  [phone]   Residential & Outpatient Substance Abuse Program  [phone]   Psychological Services Organization         Address  Phone  Notes  [facility]  [phone]- [phone]   [facility]  [phone]- [phone]   [facility] [address], [place] [phone] or [phone]    Mobile Crisis Teams Organization         Address  Phone  Notes  [facility], Mobile Crisis Care Unit  [phone]   [facility]  [address] [place], [place] [phone]   Doristine LocksSharon DeEsch [address] [place] [phone]    Self-Help/Support Groups Organization          Address  Phone             Notes  [facility] [place] -  variety of support groups  [phone] Call for more information  Narcotics Anonymous (NA), [facility] [address], [employer] [place]  2 meetings at this location   Residential Sports administrator         Address  Phone  Notes  [facility] [address],    [place]  [phone]   [facility]  [address], [place] [address], [place], [place] [phone]   [facility] [address] [place], [place] [phone] Admissions: 8am-3pm M-F  [facility] [address]    [place], [place] [phone]   [facility] [address] [place], [place], [place] [phone]   [facility] [address]  [place], [place] [phone]   [facility] - Intensive Outpatient [address], [place], [place] [phone]   [facility] ([facility]iction [facility] [address] [place].,  [place], [place] [phone] or [phone]   [facility] ([facility]) [address] [place], [place] [phone] Accepts Medicaid  [facility] [place] [address],  [place] [phone] Substance Abuse/Addiction Treatment   [facility] Organization         Address  Phone  Notes  [facility]  [phone]   Angie Fava, PhD [address] Ervin Knack [place], [place]   [phone] or [phone]   [facility]al   [address] [place], [place] [phone]   [facility] [address] [place], [place] [phone] Insurance/Medicaid/sponsorship through [facility] [address]                                    [place], [place] [phone] Therapy/tele-psych/case  [facility] [address], [place] [phone]    Dr. Lolly Mustache  [phone]   [facility] [place]  [employer]  [facility] Dept. 1) [address], [place] 2) [address] [place] 3)  [address], [place] [phone]  [phone]   [facility] Child Abuse Hotline 321-001-6589 or [phone] (After Hours)

## 2013-06-06 NOTE — ED Notes (Signed)
Pt c/o right knee pain that has became worse since Friday after she went dancing. This morning pt is unable to touch knee, bend it or put any pressure on leg when standing. Pt stated that she was told she does not have any cartilage in right knee.

## 2013-06-06 NOTE — ED Provider Notes (Signed)
CSN: [id number]     Arrival date & time 06/06/13  0718 History   First MD Initiated Contact with Patient 06/06/13 [phone]     Chief Complaint  Patient presents with  . Knee Pain    HPI  [name] is a 48 y.o. female with a PMH of plica syndrome, chondromalacia of the patella, knee cartilage surgery in 2013 [facility]), GERD, arthritis, varicose veins, depression, anxiety, ovarian cyst, lower back pain, and eczema who presents to the ED for evaluation of knee pain.  History was provided by the patient. She has a hx of chronic right knee pain, which worsened after dancing 3 days ago. She complains of associated swelling and pain with movement. She states that yesterday she was trying to get into the bathtub when she twisted her right knee and had continued worsening of her pain. Patient denies any fall or other injuries. Patient states that she cannot take the pain anymore and has tried Advil with no relief. She also has a cane and knee brace which she has been using. She cannot remember the name of her previous orthopedic surgeon but has been told that she needs a knee replacement, but does not want surgery. She also has tried joint injections in the past without any success. Patient denies any hip pain, calf swelling/pain, leg edema, fever, chills, change in appetite/activity. No weakness, loss of sensation, numbness, or tingling.    Past Medical History  Diagnosis Date  . Depression   . Anxiety   . Panic attacks   . GERD (gastroesophageal reflux disease)   . Arthritis   . VARICOSE VEINS, LOWER EXTREMITIES 06/30/2006    Qualifier: Diagnosis of  By: Seleta Rhymes MD, Loraine Leriche    . TOBACCO ABUSE 04/13/2008  . 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 OVARIAN CYST 08/12/2007    Qualifier: Diagnosis of  By: Seleta Rhymes MD, Loraine Leriche    . LOW BACK PAIN, CHRONIC 06/30/2006    Qualifier: Diagnosis of  By: Seleta Rhymes MD, Loraine Leriche    . Chondromalacia of patella 01/21/2011    Overview:  Grade II  Last Assessment & Plan:  Relevant Hx: Course:  Daily Update: Today's Plan:   Marland Kitchen ECZEMA, ATOPIC DERMATITIS 08/25/2007    Qualifier: Diagnosis of  By: Seleta Rhymes MD, Loraine Leriche     Past Surgical History  Procedure Laterality Date  . Knee cartilage surgery  2013    right    Family History  Problem Relation Age of Onset  . Hypertension Mother   . Diabetes Mother   . Diabetes Father   . Hypertension Father    History  Substance Use Topics  . Smoking status: Current Every Day Smoker -- 1.00 packs/day    Types: Cigarettes  . Smokeless tobacco: Never Used  . Alcohol Use: Yes     Comment: Drinks <10 monthly   OB History   Grav Para Term Preterm Abortions TAB SAB Ect Mult Living                 Review of Systems  Constitutional: Negative for fever, chills, diaphoresis, activity change, appetite change and fatigue.  Cardiovascular: Negative for leg swelling.  Musculoskeletal: Positive for arthralgias (right knee), gait problem (due to pain) and joint swelling. Negative for myalgias.  Skin: Negative for color change, rash and wound.  Neurological: Negative for weakness and numbness.    Allergies  Review of patient's allergies indicates no known allergies.  Home Medications   Current Outpatient Rx  Name  Route  Sig  Dispense  Refill  . ALPRAZolam (XANAX) 1 MG tablet   Oral   Take 1 mg by mouth 3 (three) times daily as needed. For anxiety         . cyclobenzaprine (FLEXERIL) 10 MG tablet   Oral   Take 1 tablet (10 mg total) by mouth 3 (three) times daily as needed for muscle spasms.   30 tablet   1   . divalproex (DEPAKOTE ER) 500 MG 24 hr tablet   Oral   Take 500 mg by mouth at bedtime. Take 2 tablets by mouth at bedtime         . HYDROcodone-acetaminophen (NORCO/VICODIN) 5-325 MG per tablet   Oral   Take 1 tablet by mouth 2 (two) times daily as needed.   14 tablet   0   . pantoprazole (PROTONIX) 20 MG tablet      TAKE 2 TABLETS BY MOUTH EVERY DAY   60 tablet   3   . ziprasidone (GEODON) 60 MG capsule   Oral    Take 60 mg by mouth 2 (two) times daily with a meal.         . zolpidem (AMBIEN) 10 MG tablet   Oral   Take 10 mg by mouth at bedtime as needed.          BP 124/83  Pulse 71  Temp(Src) 97.5 F (36.4 C) (Oral)  Resp 18  Ht 6\' 3"  (1.905 m)  Wt 245 lb (111.131 kg)  BMI 30.62 kg/m2  SpO2 98%  LMP 05/27/2013  Filed Vitals:   06/06/13 0731 06/06/13 0948  BP: 124/83 120/80  Pulse: 71 78  Temp: 97.5 F (36.4 C)   TempSrc: Oral   Resp: 18 18  Height: 6\' 3"  (1.905 m)   Weight: 245 lb (111.131 kg)   SpO2: 98% 99%     Physical Exam  Nursing note and vitals reviewed. Constitutional: She appears well-developed and well-nourished. No distress.  HENT:  Head: Normocephalic and atraumatic.  Right Ear: External ear normal.  Left Ear: External ear normal.  Mouth/Throat: Oropharynx is clear and moist.  Eyes: Conjunctivae are normal. Right eye exhibits no discharge. Left eye exhibits no discharge.  Neck: Normal range of motion. Neck supple.  Cardiovascular: Normal rate, regular rhythm and intact distal pulses.  Exam reveals no gallop and no friction rub.   No murmur heard. Dorsalis pedis pulses present and equal bilaterally  Pulmonary/Chest: Effort normal and breath sounds normal. No respiratory distress. She has no wheezes. She has no rales. She exhibits no tenderness.  Musculoskeletal: Normal range of motion. She exhibits edema and tenderness.  Tenderness to palpation to the right knee throughout. Patient refuses to flex right knee due to pain. No right hip, calf, ankle, or foot tenderness. Patient able to circumduct right ankle and flex/extend digits without any difficulty or limitations. Mild edema to the right knee without effusion. No overlying erythema, ecchymosis or wounds.   Neurological: She is alert.  Gross sensation intact in the LE bilaterally  Skin: Skin is warm and dry. She is not diaphoretic.       ED Course  Procedures (including critical care time) Labs  Review Labs Reviewed - No data to display Imaging Review No results found.   EKG Interpretation None         DG Knee Complete 4 Views Right (Final result)  Result time: 06/06/13 08:40:37    Final result by Rad Results In Interface (06/06/13 08:40:37)    Narrative:  CLINICAL DATA: Recent traumatic injury with pain  EXAM: RIGHT KNEE - COMPLETE 4+ VIEW  COMPARISON: None.  FINDINGS: Medial joint space narrowing is noted. No acute fracture dislocation is seen. A small joint effusion is noted.  IMPRESSION: Small joint effusion   Electronically Signed By: Alcide Clever M.D. On: 06/06/2013 08:40         MDM   LAVEYAH ORIOL is a 48 y.o. female with a PMH of plica syndrome, chondromalacia of the patella, knee cartilage surgery in 2013 [facility]), GERD, arthritis, varicose veins, depression, anxiety, ovarian cyst, lower back pain, and eczema who presents to the ED for evaluation of knee pain.  Rechecks  9:00 PM = Pain improved. Patient able to passively flex and extend right knee with increased pain.     Etiology of knee pain possibly due to a strain vs ligament injury. X-rays negative for fracture or malalignment. Patient neurovascularly intact. Patient has mild edema isolated to the right knee. Doubt septic joint or other infectious process. Area without erythema or warmth. Patient afebrile and non-toxic in appearance. Patient has a hx of right knee pain likely exacerbated by her dancing and twisting injury. Instructed patient to follow-up with orthopedic physician at [facility]. Pain improved throughout her ED visit. Given short course of Percocet (6 tablets) and Ibuprofen. RICE method discussed. Crutches and knee immobilizer given. Return precautions, discharge instructions, and follow-up was discussed with the patient before discharge. Husband in ED for ride home.    New Prescriptions   IBUPROFEN (ADVIL,MOTRIN) 800 MG TABLET    Take 1 tablet (800 mg total) by mouth 3 (three)  times daily.   OXYCODONE-ACETAMINOPHEN (PERCOCET/ROXICET) 5-325 MG PER TABLET    Take 1-2 tablets by mouth every 4 (four) hours as needed for severe pain.     Final impressions: 1. Right knee pain       Thomasenia Sales            Jillyn Ledger, [place] 06/06/13 [phone]

## 2013-06-06 NOTE — ED Provider Notes (Signed)
Medical screening examination/treatment/procedure(s) were performed by non-physician practitioner and as supervising physician I was immediately available for consultation/collaboration.   EKG Interpretation None        Tyrene Nader Y. Mairany Bruno, MD 06/06/13 2210 

## 2013-06-17 ENCOUNTER — Telehealth: Payer: Self-pay | Admitting: Sports Medicine

## 2013-06-17 NOTE — Telephone Encounter (Signed)
Left message on patients voicemail for a a return call.[name], Virgel BouquetGiovanna S

## 2013-06-17 NOTE — Telephone Encounter (Signed)
Patient was seen in the ED on 06/06/13 for a fall. Requestiing pain meds. Will need to have a knee replacement.

## 2013-06-17 NOTE — Telephone Encounter (Signed)
Pt called back from call she received.  Want to know about the request for pain medication.

## 2013-06-17 NOTE — Telephone Encounter (Signed)
Left second message,waiting for a return call.[name], Virgel BouquetGiovanna Simon

## 2013-06-18 ENCOUNTER — Encounter: Payer: Self-pay | Admitting: Sports Medicine

## 2013-06-18 ENCOUNTER — Ambulatory Visit (INDEPENDENT_AMBULATORY_CARE_PROVIDER_SITE_OTHER): Payer: Medicaid Other | Admitting: Sports Medicine

## 2013-06-18 VITALS — BP 145/80 | HR 63 | Temp 98.8°F | Ht 75.0 in

## 2013-06-18 DIAGNOSIS — M25569 Pain in unspecified knee: Secondary | ICD-10-CM

## 2013-06-18 DIAGNOSIS — G4700 Insomnia, unspecified: Secondary | ICD-10-CM

## 2013-06-18 DIAGNOSIS — M545 Low back pain, unspecified: Secondary | ICD-10-CM

## 2013-06-18 MED ORDER — CYCLOBENZAPRINE HCL 10 MG PO TABS: 10.0000 mg | ORAL_TABLET | Freq: Three times a day (TID) | ORAL | Status: DC | PRN

## 2013-06-18 MED ORDER — DICLOFENAC SODIUM 75 MG PO TBEC: 75.0000 mg | DELAYED_RELEASE_TABLET | Freq: Two times a day (BID) | ORAL | Status: DC

## 2013-06-18 NOTE — Patient Instructions (Signed)
Call the pain medication docs on [address] And schedule a follow up. You said you wanted to stay in [place] and those are the doctors that Dr. Jeanie Seweredding recommended.

## 2013-06-18 NOTE — Telephone Encounter (Signed)
Spoke to patient informed that pain medicines are not refillable without a Dr'Simon appointment.She was instructed to schedule SDA ,if Dr Berline Choughigby no available to see any physician available.she voiced understanding.Kemo Spruce, Virgel BouquetGiovanna Simon

## 2013-06-29 NOTE — Assessment & Plan Note (Signed)
>>  ASSESSMENT AND PLAN FOR KNEE PAIN, RIGHT, CHRONIC WRITTEN ON 06/29/2013  2:41 PM BY [name], [name], DO  Problem Based Documentation:    Subjective Report: Patient reports significant right-sided knee pain. She has been seen by orthopedist at [facility] who are recommending no surgery at this time.  They would like her to see a physiatrist to see if they would potentially be able to address her pain anymore appropriate fashion.  They do not recommend opioids at this time     Assessment & Plan & Follow up Issues:  Chronic, poorly controlled condition - Started on diclofenac per orthopedics. - Patient had previously been set up to see a physiatrist in [place] at the recommendation of Dr. Jeanie Sewer her psychiatrist but she canceled this appointment.  She has been referred to a physiatrist in [place] however she would like to stay in [place] if possible. I have recommended she followup with physiatry per recommendation from her orthopedist.  She has multiple comorbid conditions that are contributing to her chronic pain syndrome including difficult to control controlled bipolar disorder, insomnia, anxiety. Given the recommendation of her orthopedist against the use opioids, we will longer be prescribing this for her she reports they did not seem to make any significant difference in her functional status. > Ensure patient has been seen by physiatrist already referred to in [place].  >50% of this 25 minute visit spent in direct patient counseling and/or coordination of care.

## 2013-06-29 NOTE — Progress Notes (Signed)
  Cathy FlackSharon L Simon - 48 y.o. female MRN [id number]  Date of birth: 09/25/1965  SUBJECTIVE:     CC: Leg Pain See problem based charting for additional subjective (including HPI, Interval History & ROS)   HISTORY: Wt Readings from Last 3 Encounters:  06/06/13 245 lb (111.131 kg)  05/12/13 250 lb (113.399 kg)  04/13/13 248 lb 12.8 oz (112.855 kg)   BP Readings from Last 3 Encounters:  06/18/13 145/80  06/06/13 120/80  05/12/13 152/93    History  Smoking status  . Current Every Day Smoker -- 1.00 packs/day  . Types: Cigarettes  Smokeless tobacco  . Never Used   Health Maintenance Due  Topic  . Tetanus/tdap   . Pap Smear     Otherwise past Medical, Surgical, Social, and Family History Reviewed per EMR Medications and Allergies reviewed and updated per below.  VITALS: BP 145/80  Pulse 63  Temp(Src) 98.8 F (37.1 C) (Oral)  Ht 6\' 3"  (1.905 m)  LMP 05/27/2013  PHYSICAL EXAM: GENERAL:  adult [place] female. In no discomfort; no respiratory distress  PSYCH: alert and appropriate, good insight   HNEENT:  no JVD   EXTREM:   antalgic gait.  Not using a cane or assistive device.  Slow to stand.    GU:   SKIN:     MEDICATIONS, LABS & OTHER ORDERS: Previous Medications   ALPRAZOLAM (XANAX) 1 MG TABLET    Take 1 mg by mouth 3 (three) times daily as needed. For anxiety   DIVALPROEX (DEPAKOTE ER) 500 MG 24 HR TABLET    Take 500 mg by mouth at bedtime. Take 2 tablets by mouth at bedtime   MENTHOL, TOPICAL ANALGESIC, (ICY HOT EX)    Apply 1 application topically 3 (three) times daily as needed (for arthritis pain).   PANTOPRAZOLE (PROTONIX) 20 MG TABLET    Take 40 mg by mouth daily.   ZIPRASIDONE (GEODON) 60 MG CAPSULE    Take 60 mg by mouth 2 (two) times daily with a meal.   ZOLPIDEM (AMBIEN) 10 MG TABLET    Take 10 mg by mouth at bedtime as needed.   Modified Medications   Modified Medication Previous Medication   CYCLOBENZAPRINE (FLEXERIL) 10 MG TABLET cyclobenzaprine  (FLEXERIL) 10 MG tablet      Take 1 tablet (10 mg total) by mouth 3 (three) times daily as needed for muscle spasms.    Take 1 tablet (10 mg total) by mouth 3 (three) times daily as needed for muscle spasms.   New Prescriptions   DICLOFENAC (VOLTAREN) 75 MG EC TABLET    Take 1 tablet (75 mg total) by mouth 2 (two) times daily.   Discontinued Medications   HYDROCODONE-ACETAMINOPHEN (NORCO/VICODIN) 5-325 MG PER TABLET    Take 1 tablet by mouth 2 (two) times daily as needed.   IBUPROFEN (ADVIL,MOTRIN) 200 MG TABLET    Take 600 mg by mouth every 6 (six) hours as needed for moderate pain.   IBUPROFEN (ADVIL,MOTRIN) 800 MG TABLET    Take 1 tablet (800 mg total) by mouth 3 (three) times daily.   OXYCODONE-ACETAMINOPHEN (PERCOCET/ROXICET) 5-325 MG PER TABLET    Take 1-2 tablets by mouth every 4 (four) hours as needed for severe pain.  No orders of the defined types were placed in this encounter.   ASSESSMENT & PLAN: See problem based charting & AVS for pt instructions.

## 2013-06-29 NOTE — Assessment & Plan Note (Signed)
Likely a large factor in her bodies ability to recover appropriately. > Follow up with [place] at next visit

## 2013-06-29 NOTE — Assessment & Plan Note (Addendum)
Problem Based Documentation:    Subjective Report:  Patient reports significant right-sided knee pain.  She has been seen by [facility] who are recommending no surgery at this time.  They would like her to see a physiatrist to see if they would potentially be able to address her pain anymore appropriate fashion.  They do not recommend opioids at this time     Assessment & Plan & Follow up Issues:  Chronic, poorly controlled condition - Started on diclofenac per orthopedics. - Patient had previously been set up to see a physiatrist in [place] at the recommendation of Dr. Jeanie Seweredding her psychiatrist but she canceled this appointment.  She has been [place] to [place] however she would like to stay in [place] if possible. 1. I have recommended she followup with physiatry per recommendation from her orthopedist.  She has multiple comorbid conditions that are contributing to her chronic pain syndrome including difficult to control controlled bipolar disorder, insomnia, anxiety. 2. Given the recommendation of her orthopedist against the use opioids, we will longer be prescribing this for her she reports they did not seem to make any significant difference in her functional status. > Ensure patient has been seen by physiatrist already referred to in [place].  >50% of this 25 minute visit spent in direct patient counseling and/or coordination of care.

## 2013-06-29 NOTE — Assessment & Plan Note (Signed)
Complicating the entire picture. Pt denies any radicular symptoms, change in bowel or bladder habits, muscle weakness or falls associated with back pain.  No fevers, chills, night sweats or weight loss.

## 2013-06-30 ENCOUNTER — Telehealth: Payer: Self-pay | Admitting: Sports Medicine

## 2013-06-30 NOTE — Telephone Encounter (Signed)
Pt called and would like some pain pills for her knee. She said that the medication that she has does not work and she is going on vacation and needs something stronger. jw

## 2013-06-30 NOTE — Telephone Encounter (Signed)
Medication voltaren and flexril are not helping

## 2013-07-05 NOTE — Telephone Encounter (Signed)
Pt needs to be seen if having worsening pain.  Pt needs to ensure she has scheduled follow up with pain management

## 2013-07-06 NOTE — Telephone Encounter (Signed)
notified

## 2013-07-12 ENCOUNTER — Other Ambulatory Visit: Payer: Self-pay | Admitting: Sports Medicine

## 2013-07-12 NOTE — Telephone Encounter (Signed)
Pt called and needs a refill on her acid reflux medication called in. jw

## 2013-11-12 ENCOUNTER — Other Ambulatory Visit: Payer: Self-pay | Admitting: *Deleted

## 2013-11-12 ENCOUNTER — Telehealth: Payer: Self-pay | Admitting: Family Medicine

## 2013-11-12 NOTE — Telephone Encounter (Signed)
Refill request for Protonix. Pls refill asap.

## 2013-11-13 MED ORDER — PANTOPRAZOLE SODIUM 20 MG PO TBEC: DELAYED_RELEASE_TABLET | ORAL | Status: DC

## 2013-11-13 NOTE — Telephone Encounter (Signed)
Still bletching Will call in rx for refill  [facility], MD

## 2013-12-06 ENCOUNTER — Ambulatory Visit: Payer: Self-pay | Admitting: Family Medicine

## 2013-12-07 ENCOUNTER — Encounter: Payer: Self-pay | Admitting: Family Medicine

## 2013-12-07 ENCOUNTER — Ambulatory Visit (INDEPENDENT_AMBULATORY_CARE_PROVIDER_SITE_OTHER): Payer: Medicaid Other | Admitting: Family Medicine

## 2013-12-07 VITALS — BP 137/79 | HR 82 | Temp 98.6°F | Wt 236.0 lb

## 2013-12-07 DIAGNOSIS — L732 Hidradenitis suppurativa: Secondary | ICD-10-CM | POA: Insufficient documentation

## 2013-12-07 DIAGNOSIS — L0293 Carbuncle, unspecified: Principal | ICD-10-CM

## 2013-12-07 DIAGNOSIS — L0292 Furuncle, unspecified: Secondary | ICD-10-CM

## 2013-12-07 MED ORDER — DOXYCYCLINE HYCLATE 100 MG PO TABS: 100.0000 mg | ORAL_TABLET | Freq: Two times a day (BID) | ORAL | Status: DC

## 2013-12-07 NOTE — Progress Notes (Signed)
[name] is a 48 y.o. female who presents today for skin boil.  Furnuncle - Pt states that she has had a abscess under her R arm for about 5-6 days now, opened it up on her own, and has been draining since then.  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  Denies allergies or hx of MRSA  Past Medical History  Diagnosis Date  . Depression   . Anxiety   . Panic attacks   . GERD (gastroesophageal reflux disease)   . Arthritis   . VARICOSE VEINS, LOWER EXTREMITIES 06/30/2006    Qualifier: Diagnosis of  By: Seleta Rhymes MD, Loraine Leriche    . TOBACCO ABUSE 04/13/2008  . 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 OVARIAN CYST 08/12/2007    Qualifier: Diagnosis of  By: Seleta Rhymes MD, Loraine Leriche    . LOW BACK PAIN, CHRONIC 06/30/2006    Qualifier: Diagnosis of  By: Seleta Rhymes MD, Loraine Leriche    . Chondromalacia of patella 01/21/2011    Overview:  Grade II  Last Assessment & Plan:  Relevant Hx: Course: Daily Update: Today's Plan:   Marland Kitchen ECZEMA, ATOPIC DERMATITIS 08/25/2007    Qualifier: Diagnosis of  By: Seleta Rhymes MD, Mark      History  Smoking status  . Current Every Day Smoker -- 1.00 packs/day  . Types: Cigarettes  Smokeless tobacco  . Never Used    Family History  Problem Relation Age of Onset  . Hypertension Mother   . Diabetes Mother   . Diabetes Father   . Hypertension Father     Current Outpatient Prescriptions on File Prior to Visit  Medication Sig Dispense Refill  . ALPRAZolam (XANAX) 1 MG tablet Take 1 mg by mouth 3 (three) times daily as needed. For anxiety      . cyclobenzaprine (FLEXERIL) 10 MG tablet Take 1 tablet (10 mg total) by mouth 3 (three) times daily as needed for muscle spasms.  30 tablet  1  . diclofenac (VOLTAREN) 75 MG EC tablet Take 1 tablet (75 mg total) by mouth 2 (two) times daily.  60 tablet  0  . divalproex (DEPAKOTE ER) 500 MG 24 hr tablet Take 500 mg by mouth at bedtime. Take 2 tablets by mouth at bedtime      . Menthol, Topical Analgesic, (ICY  HOT EX) Apply 1 application topically 3 (three) times daily as needed (for arthritis pain).      . pantoprazole (PROTONIX) 20 MG tablet TAKE 2 TABLETS BY MOUTH EVERY DAY  60 tablet  3  . ziprasidone (GEODON) 60 MG capsule Take 60 mg by mouth 2 (two) times daily with a meal.      . zolpidem (AMBIEN) 10 MG tablet Take 10 mg by mouth at bedtime as needed.       No current facility-administered medications on file prior to visit.    ROS: Per HPI.  All other systems reviewed and are negative.   Physical Exam Filed Vitals:   12/07/13 0927  BP: 137/79  Pulse: 82  Temp: 98.6 F (37 C)    Physical Examination: General appearance - alert, well appearing, and in no distress Skin - 1 x 1 cm furuncle under R axilla, actively draining

## 2013-12-07 NOTE — Patient Instructions (Signed)
Doxycycline delayed-release capsules What is this medicine? DOXYCYCLINE (dox i SYE kleen) is a tetracycline antibiotic. It is used to treat certain kinds of bacterial infections, Lyme disease, and malaria. It will not work for colds, flu, or other viral infections. This medicine may be used for other purposes; ask your health care provider or pharmacist if you have questions. COMMON BRAND NAME(S): Doryx, Oracea What should I tell my health care provider before I take this medicine? They need to know if you have any of these conditions: -bowel disease like colitis -liver disease -long exposure to sunlight like working outdoors -an unusual or allergic reaction to doxycycline, tetracycline antibiotics, other medicines, foods, dyes, or preservatives -pregnant or trying to get pregnant -breast-feeding How should I use this medicine? Take this medicine by mouth with a full glass of water. Follow the directions on the prescription label. Do not crush or chew. The capsules may be opened and the pellets sprinkled on applesauce. Swallow the pellets whole without chewing. Follow with an 8 ounce glass of water to help you swallow all the pellets. Do not prepare a dose and store for later use. The applesauce mixture should be taken immediately after you prepare it. It is best to take this medicine without other food, but if it upsets your stomach take it with food. Take your medicine at regular intervals. Do not take your medicine more often than directed. Take all of your medicine as directed even if you think your are better. Do not skip doses or stop your medicine early. Talk to your pediatrician regarding the use of this medicine in children. Special care may be needed. While this drug may be prescribed for children as young as 38 years old for selected conditions, precautions do apply. Overdosage: If you think you have taken too much of this medicine contact a poison control center or emergency room at  once. NOTE: This medicine is only for you. Do not share this medicine with others. What if I miss a dose? If you miss a dose, take it as soon as you can. If it is almost time for your next dose, take only that dose. Do not take double or extra doses. What may interact with this medicine? -antacids -barbiturates -birth control pills -bismuth subsalicylate -carbamazepine -methoxyflurane -other antibiotics -phenytoin -vitamins that contain iron -warfarin This list may not describe all possible interactions. Give your health care provider a list of all the medicines, herbs, non-prescription drugs, or dietary supplements you use. Also tell them if you smoke, drink alcohol, or use illegal drugs. Some items may interact with your medicine. What should I watch for while using this medicine? Tell your doctor or health care professional if your symptoms do not improve. Do not treat diarrhea with over the counter products. Contact your doctor if you have diarrhea that lasts more than 2 days or if it is severe and watery. Do not take this medicine just before going to bed. It may not dissolve properly when you lay down and can cause pain in your throat. Drink plenty of fluids while taking this medicine to also help reduce irritation in your throat. This medicine can make you more sensitive to the sun. Keep out of the sun. If you cannot avoid being in the sun, wear protective clothing and use sunscreen. Do not use sun lamps or tanning beds/booths. If you are being treated for a sexually transmitted infection, avoid sexual contact until you have finished your treatment. Your sexual partner may also need  treatment. Avoid antacids, aluminum, calcium, magnesium, and iron products for 4 hours before and 2 hours after taking a dose of this medicine. Birth control pills may not work properly while you are taking this medicine. Talk to your doctor about using an extra method of birth control. If you are using  this medicine to prevent malaria, you should still protect yourself from contact with mosquitos. Stay in screened-in areas, use mosquito nets, keep your body covered, and use an insect repellent. What side effects may I notice from receiving this medicine? Side effects that you should report to your doctor or health care professional as soon as possible: -allergic reactions like skin rash, itching or hives, swelling of the face, lips, or tongue -difficulty breathing -fever -itching in the rectal or genital area -pain on swallowing -redness, blistering, peeling or loosening of the skin, including inside the mouth -severe stomach pain or cramps -unusual bleeding or bruising -unusually weak or tired -yellowing of the eyes or skin Side effects that usually do not require medical attention (report to your doctor or health care professional if they continue or are bothersome): -diarrhea -loss of appetite -nausea, vomiting This list may not describe all possible side effects. Call your doctor for medical advice about side effects. You may report side effects to FDA at 1-800-FDA-1088. Where should I keep my medicine? Keep out of the reach of children. Store at room temperature, below 25 degrees C (77 degrees F). Protect from light. Keep container tightly closed. Throw away any unused medicine after the expiration date. Taking this medicine after the expiration date can make you seriously ill. NOTE: This sheet is a summary. It may not cover all possible information. If you have questions about this medicine, talk to your doctor, pharmacist, or health care provider.  2015, [employer]. (2007-07-09 14:16:19)

## 2013-12-07 NOTE — Assessment & Plan Note (Signed)
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  If recurrent, recommend coming in for a sterilized I&D and sending for Cx.

## 2013-12-13 ENCOUNTER — Other Ambulatory Visit: Payer: Self-pay | Admitting: Family Medicine

## 2013-12-14 ENCOUNTER — Encounter: Payer: Self-pay | Admitting: Family Medicine

## 2013-12-14 NOTE — Progress Notes (Signed)
Called patient to clarify what medication she is requesting a refill on. She wants her flexeril refilled. I told her she may need an appointment with Dr Bonner Puna as it doesn't look like he has met her before, it was last filled by Dr Paulla Fore. Patient states she is going out of town and really needs this refilled. Forward to PCP for refill request. Busick, Kevin Fenton

## 2013-12-14 NOTE — Progress Notes (Signed)
Pt came in stating that she needs back pain medicine sent to pharmacy.

## 2013-12-15 MED ORDER — CYCLOBENZAPRINE HCL 10 MG PO TABS: 10.0000 mg | ORAL_TABLET | Freq: Three times a day (TID) | ORAL | Status: DC | PRN

## 2013-12-15 NOTE — Progress Notes (Signed)
Patient ID: [name], female   DOB: September 13, 1965, 48 y.o.   MRN: [id number] Pt's Rx refilled but will need appointment prior to future refills.

## 2013-12-18 ENCOUNTER — Telehealth: Payer: Self-pay | Admitting: Family Medicine

## 2013-12-18 NOTE — Telephone Encounter (Signed)
Received phone call from patient regarding her flexeril prescription. She states she had called in to the clinic to get this refilled, but that the pharmacy has told her to call the doctor to get "authorization". Per Epic chart review, Dr. Jarvis Newcomer had sent in a refill on 12/15/2013. I asked the patient to call the pharmacy to confirm that they received the prescription, if not then call the after hours line back. I also asked her to ask the pharmacy if she was getting a "prior authorization", which I told her means the insurance company would not pay until we filled out additional paperwork which I did not have access to and would need to wait until Monday, or she could pay for the medication out of pocket. Patient verbalized understanding.  Tawni Carnes, MD 12/18/2013, 9:39 AM PGY-2, [facility]

## 2013-12-20 ENCOUNTER — Other Ambulatory Visit: Payer: Self-pay | Admitting: *Deleted

## 2013-12-20 MED ORDER — CYCLOBENZAPRINE HCL 10 MG PO TABS: 10.0000 mg | ORAL_TABLET | Freq: Three times a day (TID) | ORAL | Status: DC | PRN

## 2014-03-07 ENCOUNTER — Other Ambulatory Visit: Payer: Self-pay | Admitting: *Deleted

## 2014-03-07 ENCOUNTER — Encounter: Payer: Self-pay | Admitting: Family Medicine

## 2014-03-07 MED ORDER — PANTOPRAZOLE SODIUM 20 MG PO TBEC: DELAYED_RELEASE_TABLET | ORAL | Status: DC

## 2014-03-07 NOTE — Progress Notes (Signed)
Patient requests refill for Pantoprazole sod dr 20 mg ASAP. Please follow up with Patient.

## 2014-03-07 NOTE — Telephone Encounter (Signed)
Verbal order by Dr. Jarvis NewcomerGrunz to refill, Pantoprazole dispense 90, with 3 refills.  Clovis PuMartin, Tamika L, RN

## 2014-03-07 NOTE — Progress Notes (Signed)
Dr. Jarvis NewcomerGrunz ok refill #90 with 3 refill

## 2014-03-28 ENCOUNTER — Other Ambulatory Visit: Payer: Self-pay | Admitting: Family Medicine

## 2014-03-30 NOTE — Telephone Encounter (Signed)
Pt called and said she needs a refill on her Flexeril jw

## 2014-03-31 ENCOUNTER — Other Ambulatory Visit: Payer: Self-pay | Admitting: Family Medicine

## 2014-03-31 NOTE — Addendum Note (Signed)
Addended by: Farrell OursEVANS, Imani Sherrin K on: 03/31/2014 09:36 AM   Modules accepted: Orders

## 2014-04-05 ENCOUNTER — Other Ambulatory Visit: Payer: Self-pay | Admitting: Family Medicine

## 2014-04-05 ENCOUNTER — Other Ambulatory Visit: Payer: Self-pay | Admitting: *Deleted

## 2014-04-12 ENCOUNTER — Encounter: Payer: Self-pay | Admitting: Family Medicine

## 2014-04-12 ENCOUNTER — Ambulatory Visit (INDEPENDENT_AMBULATORY_CARE_PROVIDER_SITE_OTHER): Payer: Medicaid Other | Admitting: Family Medicine

## 2014-04-12 VITALS — BP 133/89 | HR 86 | Temp 97.8°F | Ht 75.0 in | Wt 238.2 lb

## 2014-04-12 DIAGNOSIS — M545 Low back pain: Secondary | ICD-10-CM

## 2014-04-12 MED ORDER — CYCLOBENZAPRINE HCL 10 MG PO TABS: ORAL_TABLET | ORAL | Status: DC

## 2014-04-12 MED ORDER — DIAZEPAM 5 MG PO TABS
5.0000 mg | ORAL_TABLET | Freq: Every evening | ORAL | Status: DC | PRN
Start: 2014-04-12 — End: 2014-05-19

## 2014-04-12 MED ORDER — TRAMADOL HCL 50 MG PO TABS: 100.0000 mg | ORAL_TABLET | Freq: Two times a day (BID) | ORAL | Status: DC | PRN

## 2014-04-12 NOTE — Patient Instructions (Signed)
Please take tramadol twice per day for arthritis pain and return in 1 month to discuss how it goes. Continue heat, exercise, and other medications.   We will also discuss your boils at that visit.

## 2014-04-12 NOTE — Progress Notes (Signed)
Subjective: [name] is a 49 y.o. female [facility].  Back pain is mod-severe nonradiating from the lower back bilaterally, constant worsening with movement. Currently taking nothing for it. Has tried tramadol in the past but didn't work for 1 day and stopped them. Pt denies any current bowel/bladder problems, fever, chills, unintentional weight loss, night time awakenings secondary to pain, weakness in one or both legs  We will also discuss your boils at that visit.   - Review of Systems: Per HPI.  - Smoking status noted  Objective: BP 133/89 mmHg  Pulse 86  Temp(Src) 97.8 F (36.6 C) (Oral)  Ht 6\' 3"  (1.905 m)  Wt 238 lb 3.2 oz (108.047 kg)  BMI 29.77 kg/m2 Gen: Overweight 49 y.o. female in NAD Back:  Normal skin. Spine with normal alignment and no deformity. No tenderness to vertebral process palpation. Paraspinous muscles are not tender and without spasm.   Range of motion is full at neck and lumbar sacral regions. Straight leg raise is neg Neuro:  Sensation and motor function 5/5 bilateral lower extremities. Patellar and achilles DTR's 2+  Assessment/Plan: [name] is a 49 y.o. female here for chronic back pain. See problem list.

## 2014-04-19 NOTE — Assessment & Plan Note (Signed)
Rx tramadol x1 month at higher dose as pt never tried this. Discussed PT but pt declines for now. Will follow up and discuss in 1 month

## 2014-05-05 ENCOUNTER — Other Ambulatory Visit: Payer: Self-pay | Admitting: Family Medicine

## 2014-05-07 ENCOUNTER — Emergency Department (HOSPITAL_COMMUNITY)
Admission: EM | Admit: 2014-05-07 | Discharge: 2014-05-07 | Disposition: A | Discharge status: Home / Self Care | Payer: Medicaid Other | Attending: Emergency Medicine | Admitting: Emergency Medicine

## 2014-05-07 ENCOUNTER — Encounter (HOSPITAL_COMMUNITY): Payer: Self-pay | Admitting: *Deleted

## 2014-05-07 DIAGNOSIS — F410 Panic disorder [episodic paroxysmal anxiety] without agoraphobia: Secondary | ICD-10-CM | POA: Diagnosis not present

## 2014-05-07 DIAGNOSIS — Z791 Long term (current) use of non-steroidal anti-inflammatories (NSAID): Secondary | ICD-10-CM | POA: Insufficient documentation

## 2014-05-07 DIAGNOSIS — Z79899 Other long term (current) drug therapy: Secondary | ICD-10-CM | POA: Diagnosis not present

## 2014-05-07 DIAGNOSIS — Z8679 Personal history of other diseases of the circulatory system: Secondary | ICD-10-CM | POA: Insufficient documentation

## 2014-05-07 DIAGNOSIS — T404X1A Poisoning by other synthetic narcotics, accidental (unintentional), initial encounter: Secondary | ICD-10-CM | POA: Diagnosis not present

## 2014-05-07 DIAGNOSIS — M25561 Pain in right knee: Secondary | ICD-10-CM | POA: Insufficient documentation

## 2014-05-07 DIAGNOSIS — G8929 Other chronic pain: Secondary | ICD-10-CM | POA: Diagnosis not present

## 2014-05-07 DIAGNOSIS — F329 Major depressive disorder, single episode, unspecified: Secondary | ICD-10-CM | POA: Diagnosis not present

## 2014-05-07 DIAGNOSIS — Z8742 Personal history of other diseases of the female genital tract: Secondary | ICD-10-CM | POA: Diagnosis not present

## 2014-05-07 DIAGNOSIS — Z720 Tobacco use: Secondary | ICD-10-CM | POA: Insufficient documentation

## 2014-05-07 DIAGNOSIS — Z872 Personal history of diseases of the skin and subcutaneous tissue: Secondary | ICD-10-CM | POA: Insufficient documentation

## 2014-05-07 DIAGNOSIS — T50901A Poisoning by unspecified drugs, medicaments and biological substances, accidental (unintentional), initial encounter: Secondary | ICD-10-CM

## 2014-05-07 DIAGNOSIS — K219 Gastro-esophageal reflux disease without esophagitis: Secondary | ICD-10-CM | POA: Insufficient documentation

## 2014-05-07 LAB — COMPREHENSIVE METABOLIC PANEL
ALK PHOS: 56 U/L (ref 39–117)
ALT: 12 U/L (ref 0–35)
AST: 16 U/L (ref 0–37)
Albumin: 3.4 g/dL — ABNORMAL LOW (ref 3.5–5.2)
Anion gap: 6 (ref 5–15)
BUN: 5 mg/dL — ABNORMAL LOW (ref 6–23)
CHLORIDE: 104 mmol/L (ref 96–112)
CO2: 26 mmol/L (ref 19–32)
Calcium: 8.6 mg/dL (ref 8.4–10.5)
Creatinine, Ser: 0.96 mg/dL (ref 0.50–1.10)
GFR calc Af Amer: 80 mL/min — ABNORMAL LOW (ref >90)
GFR, EST NON AFRICAN AMERICAN: 69 mL/min — AB (ref >90)
Glucose, Bld: 89 mg/dL (ref 70–99)
POTASSIUM: 3.4 mmol/L — AB (ref 3.5–5.1)
Sodium: 136 mmol/L (ref 135–145)
Total Bilirubin: 0.4 mg/dL (ref 0.3–1.2)
Total Protein: 7.1 g/dL (ref 6.0–8.3)

## 2014-05-07 LAB — ACETAMINOPHEN LEVEL: Acetaminophen (Tylenol), Serum: <10 ug/mL — ABNORMAL LOW (ref 10–30)

## 2014-05-07 MED ORDER — NAPROXEN 500 MG PO TABS: 500.0000 mg | ORAL_TABLET | Freq: Two times a day (BID) | ORAL | Status: DC

## 2014-05-07 NOTE — ED Provider Notes (Signed)
[facility] pending CMP and Tylenol levels.  Plan to check EKG  For any cardiac abnormalities, co-ingestion of acetaminophen, LFT abnormalities. If all normal d/c follow up with an orthopedist for pain management and PCP.   Results for orders placed or performed during the hospital encounter of 05/07/14  Acetaminophen level  Result Value Ref Range   Acetaminophen (Tylenol), Serum <10.0 (L) 10 - 30 ug/mL  Comprehensive metabolic panel  Result Value Ref Range   Sodium 136 135 - 145 mmol/L   Potassium 3.4 (L) 3.5 - 5.1 mmol/L   Chloride 104 96 - 112 mmol/L   CO2 26 19 - 32 mmol/L   Glucose, Bld 89 70 - 99 mg/dL   BUN 5 (L) 6 - 23 mg/dL   Creatinine, Ser 2.130.96 0.50 - 1.10 mg/dL   Calcium 8.6 8.4 - 08.610.5 mg/dL   Total Protein 7.1 6.0 - 8.3 g/dL   Albumin 3.4 (L) 3.5 - 5.2 g/dL   AST 16 0 - 37 U/L   ALT 12 0 - 35 U/L   Alkaline Phosphatase 56 39 - 117 U/L   Total Bilirubin 0.4 0.3 - 1.2 mg/dL   GFR calc non Af Amer 69 (L) >90 mL/min   GFR calc Af Amer 80 (L) >90 mL/min   Anion gap 6 5 - 15   No results found.    1. Chronic knee pain, right   2. Overdose, drug, accidental or unintentional, initial encounter     Afebrile, NAD, non-toxic appearing, AAOx4.  EKG unremarkable. LFTs wnl.  No evidence of coingestion of Tylenol.  Patient hemodynamically stable during stay in ED. Advised orthopedic f/u for knee pain, advised PCP f/u for further pain management. Discussed the importance of taking medications as prescribed. Return precautions discussed. Patient is agreeable to plan. Patient is stable at time of discharge   Jeannetta EllisJennifer L Antonette Hendricks, PA-C 05/07/14 1255  Vanetta MuldersScott Zackowski, MD 05/07/14 [id number]

## 2014-05-07 NOTE — ED Notes (Signed)
Per [facility] nurse, poison control called due to patient taking 4  50mg  tablets every 4 hours for 1 week. Per poison control, patient needs to be cardiac monitored.

## 2014-05-07 NOTE — ED Notes (Signed)
Pt reports pain to RT knee chronic.

## 2014-05-07 NOTE — Discharge Instructions (Signed)
Please follow up with your primary care physician in 1-2 days. If you do not have one please call the [facility] number listed above. Please follow up with Dr. Eulah PontMurphy to schedule a follow up appointment.  Please read all discharge instructions and return precautions.   Knee Pain The knee is the complex joint between your thigh and your lower leg. It is made up of bones, tendons, ligaments, and cartilage. The bones that make up the knee are:  The femur in the thigh.  The tibia and fibula in the lower leg.  The patella or kneecap riding in the groove on the lower femur. CAUSES  Knee pain is a common complaint with many causes. A few of these causes are:  Injury, such as:  A ruptured ligament or tendon injury.  Torn cartilage.  Medical conditions, such as:  Gout  Arthritis  Infections  Overuse, over training, or overdoing a physical activity. Knee pain can be minor or severe. Knee pain can accompany debilitating injury. Minor knee problems often respond well to self-care measures or get well on their own. More serious injuries may need medical intervention or even surgery. SYMPTOMS The knee is complex. Symptoms of knee problems can vary widely. Some of the problems are:  Pain with movement and weight bearing.  Swelling and tenderness.  Buckling of the knee.  Inability to straighten or extend your knee.  Your knee locks and you cannot straighten it.  Warmth and redness with pain and fever.  Deformity or dislocation of the kneecap. DIAGNOSIS  Determining what is wrong may be very straight forward such as when there is an injury. It can also be challenging because of the complexity of the knee. Tests to make a diagnosis may include:  Your caregiver taking a history and doing a physical exam.  Routine X-rays can be used to rule out other problems. X-rays will not reveal a cartilage tear. Some injuries of the knee can be diagnosed by:  Arthroscopy a  surgical technique by which a small video camera is inserted through tiny incisions on the sides of the knee. This procedure is used to examine and repair internal knee joint problems. Tiny instruments can be used during arthroscopy to repair the torn knee cartilage (meniscus).  Arthrography is a radiology technique. A contrast liquid is directly injected into the knee joint. Internal structures of the knee joint then become visible on X-ray film.  An MRI scan is a non X-ray radiology procedure in which magnetic fields and a computer produce two- or three-dimensional images of the inside of the knee. Cartilage tears are often visible using an MRI scanner. MRI scans have largely replaced arthrography in diagnosing cartilage tears of the knee.  Blood work.  Examination of the fluid that helps to lubricate the knee joint (synovial fluid). This is done by taking a sample out using a needle and a syringe. TREATMENT The treatment of knee problems depends on the cause. Some of these treatments are:  Depending on the injury, proper casting, splinting, surgery, or physical therapy care will be needed.  Give yourself adequate recovery time. Do not overuse your joints. If you begin to get sore during workout routines, back off. Slow down or do fewer repetitions.  For repetitive activities such as cycling or running, maintain your strength and nutrition.  Alternate muscle groups. For example, if you are a weight lifter, work the upper body on one day and the lower body the next.  Either tight  or weak muscles do not give the proper support for your knee. Tight or weak muscles do not absorb the stress placed on the knee joint. Keep the muscles surrounding the knee strong.  Take care of mechanical problems.  If you have flat feet, orthotics or special shoes may help. See your caregiver if you need help.  Arch supports, sometimes with wedges on the inner or outer aspect of the heel, can help. These can  shift pressure away from the side of the knee most bothered by osteoarthritis.  A brace called an "unloader" brace also may be used to help ease the pressure on the most arthritic side of the knee.  If your caregiver has prescribed crutches, braces, wraps or ice, use as directed. The acronym for this is PRICE. This means protection, rest, ice, compression, and elevation.  Nonsteroidal anti-inflammatory drugs (NSAIDs), can help relieve pain. But if taken immediately after an injury, they may actually increase swelling. Take NSAIDs with food in your stomach. Stop them if you develop stomach problems. Do not take these if you have a history of ulcers, stomach pain, or bleeding from the bowel. Do not take without your caregiver's approval if you have problems with fluid retention, heart failure, or kidney problems.  For ongoing knee problems, physical therapy may be helpful.  Glucosamine and chondroitin are over-the-counter dietary supplements. Both may help relieve the pain of osteoarthritis in the knee. These medicines are different from the usual anti-inflammatory drugs. Glucosamine may decrease the rate of cartilage destruction.  Injections of a corticosteroid drug into your knee joint may help reduce the symptoms of an arthritis flare-up. They may provide pain relief that lasts a few months. You may have to wait a few months between injections. The injections do have a small increased risk of infection, water retention, and elevated blood sugar levels.  Hyaluronic acid injected into damaged joints may ease pain and provide lubrication. These injections may work by reducing inflammation. A series of shots may give relief for as long as 6 months.  Topical painkillers. Applying certain ointments to your skin may help relieve the pain and stiffness of osteoarthritis. Ask your pharmacist for suggestions. Many over the-counter products are approved for temporary relief of arthritis pain.  In some  countries, doctors often prescribe topical NSAIDs for relief of chronic conditions such as arthritis and tendinitis. A review of treatment with NSAID creams found that they worked as well as oral medications but without the serious side effects. PREVENTION  Maintain a healthy weight. Extra pounds put more strain on your joints.  Get strong, stay limber. Weak muscles are a common cause of knee injuries. Stretching is important. Include flexibility exercises in your workouts.  Be smart about exercise. If you have osteoarthritis, chronic knee pain or recurring injuries, you may need to change the way you exercise. This does not mean you have to stop being active. If your knees ache after jogging or playing basketball, consider switching to swimming, water aerobics, or other low-impact activities, at least for a few days a week. Sometimes limiting high-impact activities will provide relief.  Make sure your shoes fit well. Choose footwear that is right for your sport.  Protect your knees. Use the proper gear for knee-sensitive activities. Use kneepads when playing volleyball or laying carpet. Buckle your seat belt every time you drive. Most shattered kneecaps occur in car accidents.  Rest when you are tired. SEEK MEDICAL CARE IF:  You have knee pain that is continual and  does not seem to be getting better.  SEEK IMMEDIATE MEDICAL CARE IF:  Your knee joint feels hot to the touch and you have a high fever. MAKE SURE YOU:   Understand these instructions.  Will watch your condition.  Will get help right away if you are not doing well or get worse. Document Released: 01/06/2007 Document Revised: 06/03/2011 Document Reviewed: 01/06/2007 [facility] Patient Information 2015 [place], [place]. This information is not intended to replace advice given to you by your health care provider. Make sure you discuss any questions you have with your health care provider.

## 2014-05-07 NOTE — ED Provider Notes (Signed)
CSN: [id number]     Arrival date & time 05/07/14  1003 History   This chart was scribed for non-physician practitioner, Trixie Dredge, PA-C working with Juliet Rude. Rubin Payor, MD by Freida Busman, ED Scribe. This patient was seen in room TR09C/TR09C and the patient's care was started at 10:50 AM.    No chief complaint on file.   The history is provided by the patient. No language interpreter was used.     HPI Comments:  [name] is a 49 y.o. female with a h/o arthritis and gout who presents to the Emergency Department complaining of chronic right knee pain that increased in the last 2 weeks. She denies acute injury and fall. She states the cartilage is gone in her right knee and was advised to hve a knee replacement but she refused knee replacement in the past. She states she takes four 50 mg tramadol pills 4 times a day for the past week with moderate relief. 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  Reports her chronic back pain is currently controlled.   Her orthopedist is in [place].   Past Medical History  Diagnosis Date  . Depression   . Anxiety   . Panic attacks   . GERD (gastroesophageal reflux disease)   . Arthritis   . VARICOSE VEINS, LOWER EXTREMITIES 06/30/2006    Qualifier: Diagnosis of  By: Seleta Rhymes MD, Loraine Leriche    . TOBACCO ABUSE 04/13/2008  . 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 OVARIAN CYST 08/12/2007    Qualifier: Diagnosis of  By: Seleta Rhymes MD, Loraine Leriche    . LOW BACK PAIN, CHRONIC 06/30/2006    Qualifier: Diagnosis of  By: Seleta Rhymes MD, Loraine Leriche    . 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 Knee cartilage surgery  2013    right    Family History   Problem Relation Age of Onset  . Hypertension Mother   . Diabetes Mother   . Diabetes Father   . Hypertension Father    History  Substance Use Topics  . Smoking status: Current Every Day Smoker -- 1.00 packs/day    Types: Cigarettes  . Smokeless tobacco: Never Used  . Alcohol Use: Yes     Comment: Drinks <10 monthly   OB History    No data available     Review of Systems  Constitutional: Negative for fever.  Musculoskeletal: Positive for myalgias, back pain and arthralgias.  Skin: Negative for color change and wound.  Allergic/Immunologic: Negative for immunocompromised state.  Hematological: Does not bruise/bleed easily.  Psychiatric/Behavioral: Negative for self-injury.      Allergies  Review of patient's allergies indicates no known allergies.  Home Medications   Prior to Admission medications   Medication Sig Start Date End Date Taking? Authorizing Provider  ALPRAZolam Prudy Feeler) 1 MG tablet Take 1 mg by mouth 3 (three) times daily as needed. For anxiety    Historical Provider, MD  cyclobenzaprine (FLEXERIL) 10 MG tablet TAKE 1 TABLET (10 MG TOTAL) BY MOUTH 3 (THREE) TIMES DAILY AS NEEDED FOR MUSCLE SPASMS. 04/12/14   Tyrone Nine, MD  diazepam (VALIUM) 5  MG tablet Take 1 tablet (5 mg total) by mouth at bedtime as needed. 04/12/14   Tyrone Nineyan B Grunz, MD  diclofenac (VOLTAREN) 75 MG EC tablet Take 1 tablet (75 mg total) by mouth 2 (two) times daily. 06/18/13   Andrena MewsMichael D [name], DO  divalproex (DEPAKOTE ER) 500 MG 24 hr tablet Take 500 mg by mouth at bedtime. Take 2 tablets by mouth at bedtime    Historical Provider, MD  pantoprazole (PROTONIX) 20 MG tablet TAKE 2 TABLETS BY MOUTH EVERY DAY 03/07/14   Tyrone Nineyan B Grunz, MD  traMADol (ULTRAM) 50 MG tablet Take 2 tablets (100 mg total) by mouth 2 (two) times daily as needed. 04/12/14   Tyrone Nineyan B Grunz, MD  ziprasidone (GEODON) 60 MG capsule Take 60 mg by mouth 2 (two) times daily with a meal.    Historical Provider, MD   There were no  vitals taken for this visit. Physical Exam  Constitutional: She appears well-developed and well-nourished. No distress.  HENT:  Head: Normocephalic and atraumatic.  Neck: Neck supple.  Cardiovascular: Intact distal pulses.   Pulmonary/Chest: Effort normal.  Musculoskeletal: She exhibits no edema.  Diffuse tenderness over right elbow, right knee, and across right toes.  No erythema, edema, warmth or focal tenderness of any joint.   Neurological: She is alert.  Skin: She is not diaphoretic.  Nursing note and vitals reviewed.   ED Course  Procedures   DIAGNOSTIC STUDIES:  Oxygen Saturation is 97% on RA, normal by my interpretation.    COORDINATION OF CARE:  10:56 AM Discussed treatment plan with pt at bedside and pt agreed to plan.  Labs Review Labs Reviewed - No data to display  Imaging Review No results found.   EKG Interpretation None       11:09 AM Spoke with Dr Rubin PayorPickering who agrees with call [facility].  I [facility] who recommends EKG, cardiac monitoring, apap level, CMP.  R/o ekg abnormalities, conduction delays, coingestion.    11:20 AM Patient signed out to DelphiJennifer Piepenbrink, PA-C, in main ED. Pt with chronic pain of multiple joints, seen recent by PCP for same.  There is no change in any of her chronic symptoms, no red flags for joint pain.  No recent injuries.  However, patient took her ultram improperly in an attempt to control the pain.  I discussed this with Po[facility] as discussed above.  Pt signed out to DelphiJennifer Piepenbrink, PA-C to check EKG, cardiac monitoring, labs.  If all unremarkable, pt may be d/c home.  Pt advised we would place knee sleeve and she would need to follow up with orthopedics and/or primary care for chronic pain management.    MDM   Final diagnoses:  Chronic knee pain, right  Overdose, drug, accidental or unintentional, initial encounter   See above for MDM.     I personally  performed the services described in this documentation, which was scribed in my presence. The recorded information has been reviewed and is accurate.   Trixie Dredgemily Dollye Glasser, PA-C 05/07/14 1336  Juliet RudeNathan R. Rubin PayorPickering, MD 05/08/14 [id number]

## 2014-05-19 ENCOUNTER — Ambulatory Visit (INDEPENDENT_AMBULATORY_CARE_PROVIDER_SITE_OTHER): Payer: Medicaid Other | Admitting: Family Medicine

## 2014-05-19 ENCOUNTER — Encounter: Payer: Self-pay | Admitting: Family Medicine

## 2014-05-19 VITALS — BP 132/77 | HR 63 | Temp 98.2°F | Ht 75.0 in | Wt 234.6 lb

## 2014-05-19 DIAGNOSIS — K047 Periapical abscess without sinus: Secondary | ICD-10-CM

## 2014-05-19 MED ORDER — TRAMADOL HCL 50 MG PO TABS: 50.0000 mg | ORAL_TABLET | Freq: Three times a day (TID) | ORAL | Status: DC | PRN

## 2014-05-19 MED ORDER — AMOXICILLIN 500 MG PO CAPS: 500.0000 mg | ORAL_CAPSULE | Freq: Three times a day (TID) | ORAL | Status: DC

## 2014-05-19 NOTE — Patient Instructions (Signed)

## 2014-05-19 NOTE — Progress Notes (Signed)
Cathy FlackSharon L Bateson is a 49 y.o. female who presents today for odontogenic abscess.  Odontogenic abscess - Pt with known hx of this and acute episode that started about 2 days ago.  Located on L side of maxillary gum region with acute pain.  Denies spread maxillary pain, fever, chills, sweats, dysphagia, paresthesias of the mouth, or swelling of the mouth.  Has not tried anything for this but has had to have similar teeth pulled in the past.     Current Outpatient Prescriptions on File Prior to Visit  Medication Sig Dispense Refill  . ALPRAZolam (XANAX) 1 MG tablet Take 1 mg by mouth 4 (four) times daily as needed for anxiety. For anxiety    . cyclobenzaprine (FLEXERIL) 10 MG tablet TAKE 1 TABLET (10 MG TOTAL) BY MOUTH 3 (THREE) TIMES DAILY AS NEEDED FOR MUSCLE SPASMS. 90 tablet 1  . divalproex (DEPAKOTE ER) 500 MG 24 hr tablet Take 500 mg by mouth at bedtime.     . naproxen sodium (ANAPROX) 220 MG tablet Take 220-440 mg by mouth 2 (two) times daily as needed (for pain).    . pantoprazole (PROTONIX) 20 MG tablet TAKE 2 TABLETS BY MOUTH EVERY DAY 90 tablet 3  . ziprasidone (GEODON) 60 MG capsule Take 60 mg by mouth 2 (two) times daily with a meal.     No current facility-administered medications on file prior to visit.    ROS: Per HPI.   Physical Exam Filed Vitals:   05/19/14 1422  BP: 132/77  Pulse: 63  Temp: 98.2 F (36.8 C)    Physical Examination: General appearance - alert, well appearing, and in no distress Mouth - MMM, O/p clear, poor dentitia, tooth 13/14 with periapical abscess Lymphatics - no palpable lymphadenopathy Heart - normal rate and regular rhythm

## 2014-05-19 NOTE — Assessment & Plan Note (Signed)
No evidence of local or systemic spread - Start amoxicillin 500 mg TID, tramadol 50 mg TID PRN for pain - Information given for dentist removal of tooth and pt understands she needs to schedule appointment immediately for procedure.

## 2014-06-21 ENCOUNTER — Telehealth: Payer: Self-pay | Admitting: Family Medicine

## 2014-06-21 NOTE — Telephone Encounter (Signed)
Pt called because she is taking more then the dosage of her Protonix. It is supposed to be twice a day but she is taking 4 or 5 times a day and now she is out. Please call in more to the pharmacy. jw

## 2014-06-22 MED ORDER — PANTOPRAZOLE SODIUM 40 MG PO TBEC: 40.0000 mg | DELAYED_RELEASE_TABLET | Freq: Two times a day (BID) | ORAL | Status: DC

## 2014-06-22 NOTE — Telephone Encounter (Signed)
Will increase dose of PPI to 40mg  BID. Will also see her for follow up for further work up.

## 2014-06-22 NOTE — Telephone Encounter (Signed)
Spoke with pt and informed her of below and told her to call back sometime next week to make an appt since the May schedule has not come out yet. Pt was agreeable. Lamonte SakaiZimmerman Rumple, April D

## 2014-06-29 ENCOUNTER — Telehealth: Payer: Self-pay | Admitting: Family Medicine

## 2014-06-29 NOTE — Telephone Encounter (Signed)
Pt called because he toilet seat broke and needs another on right away. [facility] so can we just call or fax the orders over. jw

## 2014-06-30 NOTE — Telephone Encounter (Signed)
Called pt who reports that her toilet seat has a handle which helps her get up off of the toilet seat though she cannot remember the name or type of toilet seat. [facility] at [phone] and though she is not seeing this pt, she will relay a verbal order for a raised toilet seat or appropriate toilet seat as recommended by home healthcare. Her insurance may not cover this, but I have verbally ordered it.

## 2014-07-01 NOTE — Telephone Encounter (Signed)
[facility] an [facility], so verbal order will not work.  [facility]. Ronnett Pullin, Maryjo RochesterJessica Dawn

## 2014-07-07 ENCOUNTER — Emergency Department (HOSPITAL_COMMUNITY)
Admission: EM | Admit: 2014-07-07 | Discharge: 2014-07-07 | Disposition: A | Discharge status: Home / Self Care | Payer: Medicaid Other | Source: Home / Self Care | Attending: Family Medicine | Admitting: Family Medicine

## 2014-07-07 ENCOUNTER — Emergency Department (INDEPENDENT_AMBULATORY_CARE_PROVIDER_SITE_OTHER): Payer: Medicaid Other

## 2014-07-07 ENCOUNTER — Encounter (HOSPITAL_COMMUNITY): Payer: Self-pay | Admitting: *Deleted

## 2014-07-07 DIAGNOSIS — G8929 Other chronic pain: Secondary | ICD-10-CM | POA: Diagnosis not present

## 2014-07-07 DIAGNOSIS — M791 Myalgia: Secondary | ICD-10-CM | POA: Diagnosis not present

## 2014-07-07 DIAGNOSIS — M545 Low back pain, unspecified: Secondary | ICD-10-CM

## 2014-07-07 DIAGNOSIS — W1809XA Striking against other object with subsequent fall, initial encounter: Secondary | ICD-10-CM

## 2014-07-07 DIAGNOSIS — S300XXA Contusion of lower back and pelvis, initial encounter: Secondary | ICD-10-CM | POA: Diagnosis not present

## 2014-07-07 DIAGNOSIS — M7918 Myalgia, other site: Secondary | ICD-10-CM

## 2014-07-07 NOTE — Discharge Instructions (Signed)
Back Pain, Adult °Low back pain is very common. About 1 in 5 people have back pain. The cause of low back pain is rarely dangerous. The pain often gets better over time. About half of people with a sudden onset of back pain feel better in just 2 weeks. About 8 in 10 people feel better by 6 weeks.  °CAUSES °Some common causes of back pain include: °· Strain of the muscles or ligaments supporting the spine. °· Wear and tear (degeneration) of the spinal discs. °· Arthritis. °· Direct injury to the back. °DIAGNOSIS °Most of the time, the direct cause of low back pain is not known. However, back pain can be treated effectively even when the exact cause of the pain is unknown. Answering your caregiver's questions about your overall health and symptoms is one of the most accurate ways to make sure the cause of your pain is not dangerous. If your caregiver needs more information, he or she may order lab work or imaging tests (X-rays or MRIs). However, even if imaging tests show changes in your back, this usually does not require surgery. °HOME CARE INSTRUCTIONS °For many people, back pain returns. Since low back pain is rarely dangerous, it is often a condition that people can learn to manage on their own.  °· Remain active. It is stressful on the back to sit or stand in one place. Do not sit, drive, or stand in one place for more than 30 minutes at a time. Take short walks on level surfaces as soon as pain allows. Try to increase the length of time you walk each day. °· Do not stay in bed. Resting more than 1 or 2 days can delay your recovery. °· Do not avoid exercise or work. Your body is made to move. It is not dangerous to be active, even though your back may hurt. Your back will likely heal faster if you return to being active before your pain is gone. °· Pay attention to your body when you  bend and lift. Many people have less discomfort when lifting if they bend their knees, keep the load close to their bodies, and  avoid twisting. Often, the most comfortable positions are those that put less stress on your recovering back. °· Find a comfortable position to sleep. Use a firm mattress and lie on your side with your knees slightly bent. If you lie on your back, put a pillow under your knees. °· Only take over-the-counter or prescription medicines as directed by your caregiver. Over-the-counter medicines to reduce pain and inflammation are often the most helpful. Your caregiver may prescribe muscle relaxant drugs. These medicines help dull your pain so you can more quickly return to your normal activities and healthy exercise. °· Put ice on the injured area. °¨ Put ice in a plastic bag. °¨ Place a towel between your skin and the bag. °¨ Leave the ice on for 15-20 minutes, 03-04 times a day for the first 2 to 3 days. After that, ice and heat may be alternated to reduce pain and spasms. °· Ask your caregiver about trying back exercises and gentle massage. This may be of some benefit. °· Avoid feeling anxious or stressed. Stress increases muscle tension and can worsen back pain. It is important to recognize when you are anxious or stressed and learn ways to manage it. Exercise is a great option. °SEEK MEDICAL CARE IF: °· You have pain that is not relieved with rest or medicine. °· You have pain that does not improve in 1 week. °· You have new symptoms. °· You are generally not feeling well. °SEEK   IMMEDIATE MEDICAL CARE IF:   You have pain that radiates from your back into your legs.  You develop new bowel or bladder control problems.  You have unusual weakness or numbness in your arms or legs.  You develop nausea or vomiting.  You develop abdominal pain.  You feel faint. Document Released: 03/11/2005 Document Revised: 09/10/2011 Document Reviewed: 07/13/2013 [facility] Patient Information 2015 [place], [place]. This information is not intended to replace advice given to you by your health care provider. Make sure you  discuss any questions you have with your health care provider.  Contusion A contusion is a deep bruise. Contusions happen when an injury causes bleeding under the skin. Signs of bruising include pain, puffiness (swelling), and discolored skin. The contusion may turn blue, purple, or yellow. HOME CARE   Put ice on the injured area.  Put ice in a plastic bag.  Place a towel between your skin and the bag.  Leave the ice on for 15-20 minutes, 03-04 times a day.  Only take medicine as told by your doctor.  Rest the injured area.  If possible, raise (elevate) the injured area to lessen puffiness. GET HELP RIGHT AWAY IF:   You have more bruising or puffiness.  You have pain that is getting worse.  Your puffiness or pain is not helped by medicine. MAKE SURE YOU:   Understand these instructions.  Will watch your condition.  Will get help right away if you are not doing well or get worse. Document Released: 08/28/2007 Document Revised: 06/03/2011 Document Reviewed: 01/14/2011 [facility] Patient Information 2015 [place], [place]. This information is not intended to replace advice given to you by your health care provider. Make sure you discuss any questions you have with your health care provider.  Muscle Pain Muscle pain (myalgia) may be caused by many things, including:  Overuse or muscle strain, especially if you are not in shape. This is the most common cause of muscle pain.  Injury.  Bruises.  Viruses, such as the flu.  Infectious diseases.  Fibromyalgia, which is a chronic condition that causes muscle tenderness, fatigue, and headache.  Autoimmune diseases, including lupus.  Certain drugs, including ACE inhibitors and statins. Muscle pain may be mild or severe. In most cases, the pain lasts only a short time and goes away without treatment. To diagnose the cause of your muscle pain, your health care provider will take your medical history. This means he or she will ask  you when your muscle pain began and what has been happening. If you have not had muscle pain for very long, your health care provider may want to wait before doing much testing. If your muscle pain has lasted a long time, your health care provider may want to run tests right away. If your health care provider thinks your muscle pain may be caused by illness, you may need to have additional tests to rule out certain conditions.  Treatment for muscle pain depends on the cause. Home care is often enough to relieve muscle pain. Your health care provider may also prescribe anti-inflammatory medicine. HOME CARE INSTRUCTIONS Watch your condition for any changes. The following actions may help to lessen any discomfort you are feeling:  Only take over-the-counter or prescription medicines as directed by your health care provider.  Apply ice to the sore muscle:  Put ice in a plastic bag.  Place a towel between your skin and the bag.  Leave the ice on for 15-20 minutes, 3-4 times a day.  You may alternate applying hot and cold packs to the muscle as directed by your health care provider.  If overuse is causing your muscle pain, slow down your activities until the pain goes away.  Remember that it is normal to feel some muscle pain after starting a workout program. Muscles that have not been used often will be sore at first.  Do regular, gentle exercises if you are not usually active.  Warm up before exercising to lower your risk of muscle pain.  Do not continue working out if the pain is very bad. Bad pain could mean you have injured a muscle. SEEK MEDICAL CARE IF:  Your muscle pain gets worse, and medicines do not help.  You have muscle pain that lasts longer than 3 days.  You have a rash or fever along with muscle pain.  You have muscle pain after a tick bite.  You have muscle pain while working out, even though you are in good physical condition.  You have redness, soreness, or swelling  along with muscle pain.  You have muscle pain after starting a new medicine or changing the dose of a medicine. SEEK IMMEDIATE MEDICAL CARE IF:  You have trouble breathing.  You have trouble swallowing.  You have muscle pain along with a stiff neck, fever, and vomiting.  You have severe muscle weakness or cannot move part of your body. MAKE SURE YOU:   Understand these instructions.  Will watch your condition.  Will get help right away if you are not doing well or get worse. Document Released: 01/31/2006 Document Revised: 03/16/2013 Document Reviewed: 01/05/2013 [facility] Patient Information 2015 [place], [place]. This information is not intended to replace advice given to you by your health care provider. Make sure you discuss any questions you have with your health care provider.  Muscle Pain Muscle pain (myalgia) may be caused by many things, including:  Overuse or muscle strain, especially if you are not in shape. This is the most common cause of muscle pain.  Injury.  Bruises.  Viruses, such as the flu.  Infectious diseases.  Fibromyalgia, which is a chronic condition that causes muscle tenderness, fatigue, and headache.  Autoimmune diseases, including lupus.  Certain drugs, including ACE inhibitors and statins. Muscle pain may be mild or severe. In most cases, the pain lasts only a short time and goes away without treatment. To diagnose the cause of your muscle pain, your health care provider will take your medical history. This means he or she will ask you when your muscle pain began and what has been happening. If you have not had muscle pain for very long, your health care provider may want to wait before doing much testing. If your muscle pain has lasted a long time, your health care provider may want to run tests right away. If your health care provider thinks your muscle pain may be caused by illness, you may need to have additional tests to rule out certain  conditions.  Treatment for muscle pain depends on the cause. Home care is often enough to relieve muscle pain. Your health care provider may also prescribe anti-inflammatory medicine. HOME CARE INSTRUCTIONS Watch your condition for any changes. The following actions may help to lessen any discomfort you are feeling:  Only take over-the-counter or prescription medicines as directed by your health care provider.  Apply ice to the sore muscle:  Put ice in a plastic bag.  Place a towel between your skin and the bag.  Leave the  ice on for 15-20 minutes, 3-4 times a day.  You may alternate applying hot and cold packs to the muscle as directed by your health care provider.  If overuse is causing your muscle pain, slow down your activities until the pain goes away.  Remember that it is normal to feel some muscle pain after starting a workout program. Muscles that have not been used often will be sore at first.  Do regular, gentle exercises if you are not usually active.  Warm up before exercising to lower your risk of muscle pain.  Do not continue working out if the pain is very bad. Bad pain could mean you have injured a muscle. SEEK MEDICAL CARE IF:  Your muscle pain gets worse, and medicines do not help.  You have muscle pain that lasts longer than 3 days.  You have a rash or fever along with muscle pain.  You have muscle pain after a tick bite.  You have muscle pain while working out, even though you are in good physical condition.  You have redness, soreness, or swelling along with muscle pain.  You have muscle pain after starting a new medicine or changing the dose of a medicine. SEEK IMMEDIATE MEDICAL CARE IF:  You have trouble breathing.  You have trouble swallowing.  You have muscle pain along with a stiff neck, fever, and vomiting.  You have severe muscle weakness or cannot move part of your body. MAKE SURE YOU:   Understand these instructions.  Will watch your  condition.  Will get help right away if you are not doing well or get worse. Document Released: 01/31/2006 Document Revised: 03/16/2013 Document Reviewed: 01/05/2013 [facility] Patient Information 2015 [place]. This information is not intended to replace advice given to you by your health care provider. Make sure you discuss any questions you have with your health care provider.

## 2014-07-07 NOTE — ED Notes (Signed)
Pt  Reports she  Felled   Last  Pm      And inj  Her  Lower  Back   And  r  Arm       She  States  She  Falls    A  Lot   she  Ambulated  To  Room  With a  Slow    Gait     She  Is  Awake   And  Oriented

## 2014-07-07 NOTE — ED Provider Notes (Signed)
CSN: [id number]     Arrival date & time 07/07/14  0900 History   First MD Initiated Contact with Patient 07/07/14 0940     Chief Complaint  Patient presents with  . Fall   (Consider location/radiation/quality/duration/timing/severity/associated sxs/prior Treatment) HPI Comments: 49 year old female states that she fell backwards last night onto her buttocks. She is complaining of pain over the sacrum, buttocks and across the lumbar musculature. She states she also fell onto her right arm. She is complaining of pain over the right brachial radialis. She has a history of chronic low back pain and is currently being treated by her PCP for that. When in the room she was apparently asleep lying in the right recumbent position. She is able to get onto and off the table without assistance.   Past Medical History  Diagnosis Date  . Depression   . Anxiety   . Panic attacks   . GERD (gastroesophageal reflux disease)   . Arthritis   . VARICOSE VEINS, LOWER EXTREMITIES 06/30/2006    Qualifier: Diagnosis of  By: Seleta Rhymesowand MD, Loraine LericheMark    . TOBACCO ABUSE 04/13/2008  . 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 OVARIAN CYST 08/12/2007    Qualifier: Diagnosis of  By: Seleta Rhymesowand MD, Loraine LericheMark    . LOW BACK PAIN, CHRONIC 06/30/2006    Qualifier: Diagnosis of  By: Seleta Rhymesowand MD, Loraine LericheMark    . 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 ECZEMA, ATOPIC DERMATITIS 08/25/2007    Qualifier: Diagnosis of  By: Seleta Rhymesowand MD, Loraine LericheMark     Past Surgical History  Procedure Laterality Date  . Knee cartilage surgery  2013    right    Family History  Problem Relation Age of Onset  . Hypertension Mother   . Diabetes Mother   . Diabetes Father   . Hypertension Father    History  Substance Use Topics  . Smoking status: Current Every Day Smoker -- 1.00 packs/day    Types: Cigarettes  . Smokeless tobacco: Never Used  . Alcohol Use: Yes     Comment: Drinks <10  monthly   OB History    No data available     Review of Systems  Constitutional: Positive for activity change.  HENT: Negative.   Respiratory: Negative.   Gastrointestinal: Negative.   Genitourinary: Negative for dysuria and difficulty urinating.  Musculoskeletal: Positive for myalgias, back pain and gait problem. Negative for neck pain and neck stiffness.       It is noted that the patient has chronic low back pain and knee pain with chondromalacia of the patella. Her gait is chronically antalgic.  Skin: Negative.   Neurological: Negative.     Allergies  Review of patient's allergies indicates no known allergies.  Home Medications   Prior to Admission medications   Medication Sig Start Date End Date Taking? Authorizing Provider  ALPRAZolam Prudy Feeler(XANAX) 1 MG tablet Take 1 mg by mouth 4 (four) times daily as needed for anxiety. For anxiety    Historical Provider, MD  amoxicillin (AMOXIL) 500 MG capsule Take 1 capsule (500 mg total) by mouth 3 (three) times daily. 05/19/14   Twana FirstBryan R Hess, DO  cyclobenzaprine (FLEXERIL) 10 MG tablet TAKE 1 TABLET (10 MG TOTAL) BY MOUTH 3 (THREE) TIMES DAILY AS NEEDED FOR MUSCLE SPASMS. 04/12/14   Tyrone Nineyan B Grunz, MD  divalproex (DEPAKOTE ER) 500 MG 24 hr tablet Take 500 mg  by mouth at bedtime.     Historical Provider, MD  naproxen sodium (ANAPROX) 220 MG tablet Take 220-440 mg by mouth 2 (two) times daily as needed (for pain).    Historical Provider, MD  pantoprazole (PROTONIX) 40 MG tablet Take 1 tablet (40 mg total) by mouth 2 (two) times daily. 06/22/14   Tyrone Nine, MD  traMADol (ULTRAM) 50 MG tablet Take 1 tablet (50 mg total) by mouth every 8 (eight) hours as needed. 05/19/14   Twana First Hess, DO  ziprasidone (GEODON) 60 MG capsule Take 60 mg by mouth 2 (two) times daily with a meal.    Historical Provider, MD   BP 142/62 mmHg  Pulse 66  Temp(Src) 98.2 F (36.8 C) (Oral)  Resp 16  SpO2 100%  LMP 05/26/2014 Physical Exam  Constitutional: She is  oriented to person, place, and time. She appears well-developed and well-nourished. No distress.  HENT:  Head: Normocephalic and atraumatic.  Neck: Normal range of motion. Neck supple.  Cardiovascular: Normal rate, regular rhythm and normal heart sounds.   Pulmonary/Chest: Effort normal and breath sounds normal. No respiratory distress.  Musculoskeletal: She exhibits tenderness.  Patient has tenderness to the lightest palpation across an entire lower back as well as the buttocks. Is also tenderness over the sacrum. Light touch to the dermis and subcutaneous tissue produces deep pain. There is no swelling, spinal deformity or discoloration. Difficult to determine acute versus chronic pain and that many of these areas are the same areas of which is documented for her as chronic pain.  Neurological: She is alert and oriented to person, place, and time. She exhibits normal muscle tone.  Skin: Skin is warm and dry.  Nursing note and vitals reviewed.   ED Course  Procedures (including critical care time) Labs Review Labs Reviewed - No data to display  Imaging Review Dg Sacrum/coccyx  07/07/2014   CLINICAL DATA:  Status post fall 07/06/2014 coccygeal pain. Initial encounter.  EXAM: SACRUM AND COCCYX - 2+ VIEW  COMPARISON:  None.  FINDINGS: There is no evidence of fracture or other focal bone lesions.  IMPRESSION: Negative exam.   Electronically Signed   By: Drusilla Kanner M.D.   On: 07/07/2014 10:55     MDM   1. Fall against object, initial encounter   2. Low back pain at multiple sites   3. Bilateral buttock pain   4. Contusion, buttock, initial encounter   5. Chronic low back pain    Instructions for fall and contusions. Ice to areas of acute pain. Follow-up with your PCP as scheduled.   Hayden Rasmussen, NP 07/07/14 [phone]

## 2014-07-14 ENCOUNTER — Ambulatory Visit (INDEPENDENT_AMBULATORY_CARE_PROVIDER_SITE_OTHER): Payer: Medicaid Other | Admitting: Family Medicine

## 2014-07-14 ENCOUNTER — Encounter: Payer: Self-pay | Admitting: Family Medicine

## 2014-07-14 VITALS — BP 137/79 | HR 63 | Temp 98.1°F | Ht 75.0 in | Wt 224.1 lb

## 2014-07-14 DIAGNOSIS — M545 Low back pain: Secondary | ICD-10-CM | POA: Diagnosis present

## 2014-07-14 DIAGNOSIS — S8000XA Contusion of unspecified knee, initial encounter: Secondary | ICD-10-CM | POA: Diagnosis not present

## 2014-07-14 MED ORDER — CYCLOBENZAPRINE HCL 10 MG PO TABS: ORAL_TABLET | ORAL | Status: DC

## 2014-07-14 NOTE — Patient Instructions (Signed)
Thank you for coming in, today!  I don't think you have any blood clots in your legs. I don't think steroids will help any, at this point. I will refill your muscle relaxer to help with your back pain and muscle pain.  Make an appointment to come back to see Dr. Jarvis NewcomerGrunz soon. Make this appointment specifically to talk about your appetite and stress / mood. Please feel free to call with any questions or concerns at any time, at [phone]. --Dr. Casper HarrisonStreet

## 2014-07-14 NOTE — Progress Notes (Signed)
I was preceptor the day of this visit.   

## 2014-07-14 NOTE — Progress Notes (Signed)
   Subjective:    Patient ID: [name], female    DOB: 06/24/1965, 49 y.o.   MRN: [id number]  HPI: Pt presents to clinic for [facility] clinic for concern for "blood clots" in her knees. She fell on 4/14 and went to the urgent care; she was walking to the bathroom and lost her balance and fell, hitting her knees and her elbow. She has been "off balance" for quite some time. She complains of chronic back and knee pain. She has no LE swelling, redness, increased pain or sensation of "tightness." She has no cough, shortness of breath, or rapid heartbeat.  Of note, she reports poor appetite, poor sleep, and chronic pain. She is somewhat vague about all her symptoms and reports chronic muscle tightness, as well. She is somewhat emotional throughout the interview.  Review of Systems: As above.     Objective:   Physical Exam BP 137/79 mmHg  Pulse 63  Temp(Src) 98.1 F (36.7 C) (Oral)  Ht 6\' 3"  (1.905 m)  Wt 224 lb 1.6 oz (101.651 kg)  BMI 28.01 kg/m2  LMP 05/26/2014 Gen: well-appearing adult female in NAD; does appear very emotional at times HEENT: [place]/AT, EOMI, PERRLA, MMM, TM's clear bilaterally  Nasal mucosae and posterior oropharynx clear Neck: supple, normal ROM, no lymphadenopathy Cardio: RRR, no murmur appreciated Pulm: CTAB, no wheezes, normal WOB Abd: soft, nontender, BS+ Ext: warm, well-perfused, no LE edema  Bilateral knees with mild ecchymoses, minimally tender  No frank redness or swelling of either knee  No palpable cords or calf tenderness MSK: diffuse tenderness paraspinally especially in low lumbar area, with mild-moderate muscle tightness  Able to stand / ambulate unassisted, though gait is antalgic and pt walks with a cane Neuro: alert, oriented, strength 4+/5 in bilateral LE secondary to pain, no frank radicular symptoms     Assessment & Plan:  49yo female s/p fall with chronic low back pain and bruising of both knees - no frank neurovascular compromise - no frank  evidence to suggest DVT or similar issue - vague constellation of symptoms and emotional lability; on brief chart review, there is some evidence for underlying mood issues  Plan: - reassured pt that she does not have any signs / symptoms to suggest DVT or need for imaging / labs, today - Rx for Flexeril for chronic low back pain / muscle spasms - strongly recommended f/u with PCP Dr. Jarvis NewcomerGrunz specifically to discuss mood issues, poor sleep, etc, as these may contribute greatly to chronic pain - recommended presentation to the ED if she has any frank neurological symptoms, leg swelling / redness / pain (especially if unilateral), tachycardia, or OSB  Note FYI to Dr. Sable FeilGrunz  Cathy Oldaker M Ahmeer Tuman, MD PGY-3, [facility] 07/14/2014, 12:17 PM

## 2014-07-18 NOTE — Telephone Encounter (Signed)
[facility]

## 2014-08-08 ENCOUNTER — Ambulatory Visit (INDEPENDENT_AMBULATORY_CARE_PROVIDER_SITE_OTHER): Payer: Medicaid Other | Admitting: Family Medicine

## 2014-08-08 ENCOUNTER — Encounter: Payer: Self-pay | Admitting: Family Medicine

## 2014-08-08 VITALS — BP 128/77 | HR 76 | Temp 98.4°F | Ht 75.0 in | Wt 228.0 lb

## 2014-08-08 DIAGNOSIS — M25561 Pain in right knee: Secondary | ICD-10-CM | POA: Diagnosis not present

## 2014-08-08 DIAGNOSIS — G629 Polyneuropathy, unspecified: Secondary | ICD-10-CM

## 2014-08-08 DIAGNOSIS — M1711 Unilateral primary osteoarthritis, right knee: Secondary | ICD-10-CM

## 2014-08-08 DIAGNOSIS — M545 Low back pain: Secondary | ICD-10-CM | POA: Diagnosis present

## 2014-08-08 DIAGNOSIS — M179 Osteoarthritis of knee, unspecified: Secondary | ICD-10-CM | POA: Diagnosis not present

## 2014-08-08 DIAGNOSIS — R2689 Other abnormalities of gait and mobility: Secondary | ICD-10-CM | POA: Insufficient documentation

## 2014-08-08 DIAGNOSIS — R29818 Other symptoms and signs involving the nervous system: Secondary | ICD-10-CM | POA: Diagnosis not present

## 2014-08-08 LAB — TSH: TSH: 1.521 u[IU]/mL (ref 0.350–4.500)

## 2014-08-08 LAB — CBC
HCT: 42.5 % (ref 36.0–46.0)
Hemoglobin: 13.8 g/dL (ref 12.0–15.0)
MCH: 28.1 pg (ref 26.0–34.0)
MCHC: 32.5 g/dL (ref 30.0–36.0)
MCV: 86.6 fL (ref 78.0–100.0)
MPV: 9.5 fL (ref 8.6–12.4)
Platelets: 304 10*3/uL (ref 150–400)
RBC: 4.91 MIL/uL (ref 3.87–5.11)
RDW: 14 % (ref 11.5–15.5)
WBC: 6.8 10*3/uL (ref 4.0–10.5)

## 2014-08-08 LAB — COMPREHENSIVE METABOLIC PANEL
ALK PHOS: 49 U/L (ref 39–117)
ALT: 15 U/L (ref 0–35)
AST: 16 U/L (ref 0–37)
Albumin: 3.7 g/dL (ref 3.5–5.2)
BILIRUBIN TOTAL: 0.3 mg/dL (ref 0.2–1.2)
BUN: 11 mg/dL (ref 6–23)
CALCIUM: 8.8 mg/dL (ref 8.4–10.5)
CO2: 25 mEq/L (ref 19–32)
Chloride: 104 mEq/L (ref 96–112)
Creat: 0.91 mg/dL (ref 0.50–1.10)
Glucose, Bld: 81 mg/dL (ref 70–99)
Potassium: 3.8 mEq/L (ref 3.5–5.3)
Sodium: 138 mEq/L (ref 135–145)
Total Protein: 7.1 g/dL (ref 6.0–8.3)

## 2014-08-08 LAB — POCT GLYCOSYLATED HEMOGLOBIN (HGB A1C): Hemoglobin A1C: 5.4

## 2014-08-08 LAB — VITAMIN B12: Vitamin B-12: 642 pg/mL (ref 211–911)

## 2014-08-08 NOTE — Progress Notes (Signed)
Subjective: [name] is a 49 y.o. female here falls.  FALLS: Has been falling frequently lately; falling about every other day beginning Several weeks ago. She feels off balance before falls. She denies room spinning or nausea. No trouble hearing or ringing in her ears. No vision changes but has stable poor vision and out of date glasses. She doesn't have money to buy updated pair. No chest pain, shortness of breath, palpitations. Falls are mostly at night. She takes valium qHS AFTER laying down, and xanax 4 times per day prescribed by her psychiatrist Dr. Jeanie Seweredding.   Sometimes, but not all of the times when she falls it is preceded by spontaneous giving way of her right knee. This has also been locking up on her more frequently.   She has a nurse that cooks for her and helps her bathe. She started coming several years ago.   - Review of Systems: Per HPI.   Objective: BP 128/77 mmHg  Pulse 76  Temp(Src) 98.4 F (36.9 C) (Oral)  Ht 6\' 3"  (1.905 m)  Wt 228 lb (103.42 kg)  BMI 28.50 kg/m2  LMP 06/24/2014 (Approximate) Gen: Tall, tired 49 y.o. female in no distress HEENT: MMM, EOMI, PERRL, anicteric sclerae CV: Regular rate, no murmur, rub or gallop, no JVD Resp: Non-labored, CTAB, no wheezes noted Abd: Soft, NT, ND, BS present, no guarding or organomegaly MSK: No edema noted, full ROM. Right knee with mild increased laxity with valgus stress and positive McMurray's.  Neuro: Alert and oriented, speech normal, diffusely weak. CN II-XII intact, patellar, triceps, biceps DTRs 2+. No focal peripheral deficits in sensation or motor function.   Assessment/Plan: [name] is a 49 y.o. female here for falls.

## 2014-08-08 NOTE — Patient Instructions (Signed)
Thank you for coming in today!  We are checking some labs today to investigate your loss of balance, and we'll call you if they are abnormal. If you do not hear from me by phone or letter in 2 weeks, please call us as we may have been unable to reach you.  - Call [facility] and schedule an appointment at [phone]. make sure they know your knee is giving out and causing you to fall.  - You might need to see a neurologist, but we will discuss this on the phone after your labs come back.   Our clinic's number is [phone]. Feel free to call any time with questions or concerns. We will answer any questions after hours with our 24-hour emergency line at that number as well.   - Dr. Jarvis NewcomerGrunz

## 2014-08-11 ENCOUNTER — Encounter: Payer: Self-pay | Admitting: Family Medicine

## 2014-08-12 NOTE — Assessment & Plan Note (Signed)
>>  ASSESSMENT AND PLAN FOR KNEE PAIN, RIGHT, CHRONIC WRITTEN ON 08/12/2014  9:47 AM BY Hazeline Junker B, MD  Now with locking and giving way symptoms and positive McMurray's. Refer to orthopedics.

## 2014-08-12 NOTE — Assessment & Plan Note (Signed)
Decreased monofilament sensation to the ankles bilaterally likely contributing to falls - work up ordered.

## 2014-08-12 NOTE — Assessment & Plan Note (Signed)
Now with locking and giving way symptoms and positive McMurray's. Refer to orthopedics.

## 2014-08-12 NOTE — Assessment & Plan Note (Signed)
CBC, CMP, A1c, B12 (neuropathy), TSH, orthopedist referral, recommended she obtain more current visual corrective lenses and promoted use of cane/walker at home. Depending on results she may need medical treatment, PT, OT, neurovestibular rehab or all of these.

## 2014-10-04 ENCOUNTER — Emergency Department (HOSPITAL_COMMUNITY)
Admission: EM | Admit: 2014-10-04 | Discharge: 2014-10-04 | Disposition: A | Discharge status: Home / Self Care | Payer: Medicaid Other | Attending: Emergency Medicine | Admitting: Emergency Medicine

## 2014-10-04 ENCOUNTER — Emergency Department (HOSPITAL_COMMUNITY): Payer: Medicaid Other

## 2014-10-04 ENCOUNTER — Encounter (HOSPITAL_COMMUNITY): Payer: Self-pay | Admitting: Emergency Medicine

## 2014-10-04 DIAGNOSIS — K219 Gastro-esophageal reflux disease without esophagitis: Secondary | ICD-10-CM | POA: Insufficient documentation

## 2014-10-04 DIAGNOSIS — Z8742 Personal history of other diseases of the female genital tract: Secondary | ICD-10-CM | POA: Diagnosis not present

## 2014-10-04 DIAGNOSIS — G8929 Other chronic pain: Secondary | ICD-10-CM | POA: Diagnosis not present

## 2014-10-04 DIAGNOSIS — Z720 Tobacco use: Secondary | ICD-10-CM | POA: Diagnosis not present

## 2014-10-04 DIAGNOSIS — Z792 Long term (current) use of antibiotics: Secondary | ICD-10-CM | POA: Diagnosis not present

## 2014-10-04 DIAGNOSIS — F329 Major depressive disorder, single episode, unspecified: Secondary | ICD-10-CM | POA: Diagnosis not present

## 2014-10-04 DIAGNOSIS — F410 Panic disorder [episodic paroxysmal anxiety] without agoraphobia: Secondary | ICD-10-CM | POA: Insufficient documentation

## 2014-10-04 DIAGNOSIS — L02214 Cutaneous abscess of groin: Secondary | ICD-10-CM | POA: Diagnosis not present

## 2014-10-04 DIAGNOSIS — M1990 Unspecified osteoarthritis, unspecified site: Secondary | ICD-10-CM | POA: Diagnosis not present

## 2014-10-04 DIAGNOSIS — M722 Plantar fascial fibromatosis: Secondary | ICD-10-CM | POA: Diagnosis not present

## 2014-10-04 DIAGNOSIS — M79672 Pain in left foot: Secondary | ICD-10-CM | POA: Diagnosis present

## 2014-10-04 DIAGNOSIS — Z79899 Other long term (current) drug therapy: Secondary | ICD-10-CM | POA: Insufficient documentation

## 2014-10-04 MED ORDER — TRAMADOL HCL 50 MG PO TABS: 50.0000 mg | ORAL_TABLET | Freq: Four times a day (QID) | ORAL | Status: DC | PRN

## 2014-10-04 MED ORDER — TRAMADOL HCL 50 MG PO TABS
50.0000 mg | ORAL_TABLET | Freq: Once | ORAL | Status: AC
  Administered 2014-10-04: 50 mg via ORAL
  Filled 2014-10-04: qty 1

## 2014-10-04 MED ORDER — CLINDAMYCIN HCL 150 MG PO CAPS: 300.0000 mg | ORAL_CAPSULE | Freq: Three times a day (TID) | ORAL | Status: DC

## 2014-10-04 NOTE — ED Notes (Signed)
Pt back from x-ray.

## 2014-10-04 NOTE — ED Notes (Signed)
Patient states R foot pain x 1 week.  "I've had a cyst bump since I was a kid".  Patient states knot on bottom of her R foot.   Patient states she has chronic problems with boils which she has in her L groin area.   Patient states that she needs antibiotics for those "so they can drain".

## 2014-10-04 NOTE — Discharge Instructions (Signed)
Please follow up with your primary care physician in 1-2 days. If you do not have one please call the [facility] number listed above. Please follow up with Dr. 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 Please take pain medication and/or muscle relaxants as prescribed and as needed for pain. Please do not drive on narcotic pain medication or on muscle relaxants. Please read all discharge instructions and return precautions.    Abscess An abscess is an infected area that contains a collection of pus and debris.It can occur in almost any part of the body. An abscess is also known as a furuncle or boil. CAUSES  An abscess occurs when tissue gets infected. This can occur from blockage of oil or sweat glands, infection of hair follicles, or a minor injury to the skin. As the body tries to fight the infection, pus collects in the area and creates pressure under the skin. This pressure causes pain. People with weakened immune systems have difficulty fighting infections and get certain abscesses more often.  SYMPTOMS Usually an abscess develops on the skin and becomes a painful mass that is red, warm, and tender. If the abscess forms under the skin, you may feel a moveable soft area under the skin. Some abscesses break open (rupture) on their own, but most will continue to get worse without care. The infection can spread deeper into the body and eventually into the bloodstream, causing you to feel ill.  DIAGNOSIS  Your caregiver will take your medical history and perform a physical exam. A sample of fluid may also be taken from the abscess to determine what is causing your infection. TREATMENT  Your caregiver may prescribe antibiotic medicines to fight the infection. However, taking antibiotics alone usually does not cure an abscess. Your caregiver may need to make a small cut (incision) in the abscess to drain the pus. In some cases, gauze is  packed into the abscess to reduce pain and to continue draining the area. HOME CARE INSTRUCTIONS   Only take over-the-counter or prescription medicines for pain, discomfort, or fever as directed by your caregiver.  If you were prescribed antibiotics, take them as directed. Finish them even if you start to feel better.  If gauze is used, follow your caregiver's directions for changing the gauze.  To avoid spreading the infection:  Keep your draining abscess covered with a bandage.  Wash your hands well.  Do not share personal care items, towels, or whirlpools with others.  Avoid skin contact with others.  Keep your skin and clothes clean around the abscess.  Keep all follow-up appointments as directed by your caregiver. SEEK MEDICAL CARE IF:   You have increased pain, swelling, redness, fluid drainage, or bleeding.  You have muscle aches, chills, or a general ill feeling.  You have a fever. MAKE SURE YOU:   Understand these instructions.  Will watch your condition.  Will get help right away if you are not doing well or get worse. Document Released: 12/19/2004 Document Revised: 09/10/2011 Document Reviewed: 05/24/2011 [facility] Patient Information 2015 [employer] [place]. This information is not intended to replace advice given to you by your health care provider. Make sure you discuss any questions you have with your health care provider.

## 2014-10-04 NOTE — ED Provider Notes (Signed)
CSN: [id number]     Arrival date & time 10/04/14  [id number] History   First MD Initiated Contact with Patient 10/04/14 1000     Chief Complaint  [id number] presents with  . Foot Pain  . Abscess     (Consider location/radiation/quality/duration/timing/severity/associated sxs/prior Treatment) HPI Comments: Patient is a 49 yo F presenting to the ED for two complaints. Patient states R foot pain x 1 week. "I've had a cyst bump since I was a kid". Patient states knot on bottom of her R foot. Patient states she has chronic problems with boils which she has in her L groin area. Patient states that she needs antibiotics for those "so they can drain".   Patient is a 49 y.o. female presenting with lower extremity pain and abscess. The history is provided by the patient.  Foot Pain This is a new problem. The current episode started in the past 7 days. The problem occurs constantly. The problem has been gradually worsening. Associated symptoms include arthralgias and myalgias. Pertinent negatives include no fever. The symptoms are aggravated by walking. She has tried acetaminophen and NSAIDs for the symptoms. The treatment provided no relief.  Abscess Location:  Foot Foot abscess location:  Sole of R foot Size:  2 Abscess quality: draining and painful   Red streaking: no   Duration:  1 week Progression:  Worsening Pain details:    Quality:  Pressure Chronicity:  Recurrent Context: not immunosuppression   Relieved by:  Nothing Worsened by:  Nothing tried Ineffective treatments:  None tried Associated symptoms: no fever     Past Medical History  Diagnosis Date  . Depression   . Anxiety   . Panic attacks   . GERD (gastroesophageal reflux disease)   . Arthritis   . VARICOSE VEINS, LOWER EXTREMITIES 06/30/2006    Qualifier: Diagnosis of  By: Seleta Rhymesowand MD, Loraine LericheMark    . TOBACCO ABUSE 04/13/2008  . 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 OVARIAN CYST 08/12/2007    Qualifier: Diagnosis of  By: Seleta Rhymesowand MD, Loraine LericheMark      . LOW BACK PAIN, CHRONIC 06/30/2006    Qualifier: Diagnosis of  By: Seleta Rhymesowand MD, Loraine LericheMark    . 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 ECZEMA, ATOPIC DERMATITIS 08/25/2007    Qualifier: Diagnosis of  By: Seleta Rhymesowand MD, Loraine LericheMark     Past Surgical History  Procedure Laterality Date  . Knee cartilage surgery  2013    right    Family History  Problem Relation Age of Onset  . Hypertension Mother   . Diabetes Mother   . Diabetes Father   . Hypertension Father    History  Substance Use Topics  . Smoking status: Current Every Day Smoker -- 1.00 packs/day    Types: Cigarettes  . Smokeless tobacco: Never Used  . Alcohol Use: Yes     Comment: Drinks <10 monthly   OB History    No data available     Review of Systems  Constitutional: Negative for fever.  Musculoskeletal: Positive for myalgias and arthralgias.  Skin: Positive for wound.  All other systems reviewed and are negative.     Allergies  Review of patient's allergies indicates no known allergies.  Home Medications   Prior to Admission medications   Medication Sig Start Date End Date Taking? Authorizing Provider  ALPRAZolam Prudy Feeler(XANAX) 1 MG tablet Take 1 mg by mouth 4 (four)  times daily as needed for anxiety. For anxiety    Historical Provider, MD  amoxicillin (AMOXIL) 500 MG capsule Take 1 capsule (500 mg total) by mouth 3 (three) times daily. 05/19/14   Twana First Hess, DO  clindamycin (CLEOCIN) 150 MG capsule Take 2 capsules (300 mg total) by mouth 3 (three) times daily. May dispense as 150mg  capsules 10/04/14   Jasmon Graffam, PA-C  cyclobenzaprine (FLEXERIL) 10 MG tablet TAKE 1 TABLET (10 MG TOTAL) BY MOUTH 3 (THREE) TIMES DAILY AS NEEDED FOR MUSCLE SPASMS. 07/14/14   Stephanie Coup Street, MD  divalproex (DEPAKOTE ER) 500 MG 24 hr tablet Take 500 mg by mouth at bedtime.     Historical Provider, MD  naproxen sodium (ANAPROX) 220 MG tablet  Take 220-440 mg by mouth 2 (two) times daily as needed (for pain).    Historical Provider, MD  pantoprazole (PROTONIX) 40 MG tablet Take 1 tablet (40 mg total) by mouth 2 (two) times daily. 06/22/14   Tyrone Nine, MD  traMADol (ULTRAM) 50 MG tablet Take 1 tablet (50 mg total) by mouth every 8 (eight) hours as needed. 05/19/14   Briscoe Deutscher, DO  traMADol (ULTRAM) 50 MG tablet Take 1 tablet (50 mg total) by mouth every 6 (six) hours as needed. 10/04/14   Kyna Blahnik, PA-C  ziprasidone (GEODON) 60 MG capsule Take 60 mg by mouth 2 (two) times daily with a meal.    Historical Provider, MD   BP 143/82 mmHg  Pulse 76  Temp(Src) 97.5 F (36.4 C) (Oral)  Resp 18  SpO2 98%  LMP 10/02/2014 Physical Exam  Constitutional: She is oriented to person, place, and time. She appears well-developed and well-nourished. No distress.  HENT:  Head: Normocephalic and atraumatic.  Right Ear: External ear normal.  Left Ear: External ear normal.  Nose: Nose normal.  Mouth/Throat: Oropharynx is clear and moist.  Eyes: Conjunctivae are normal.  Neck: Normal range of motion. Neck supple.  No nuchal rigidity.   Cardiovascular: Normal rate, regular rhythm, normal heart sounds and intact distal pulses.   Pulmonary/Chest: Effort normal and breath sounds normal.  Abdominal: Soft.  Musculoskeletal: Normal range of motion.       Right ankle: Normal.       Left ankle: Normal.       Right foot: There is tenderness. There is normal range of motion, no bony tenderness, no swelling, normal capillary refill and no deformity.       Left foot: Normal.       Feet:  Neurological: She is alert and oriented to person, place, and time.  Skin: Skin is warm and dry. She is not diaphoretic.     Psychiatric: She has a normal mood and affect.  Nursing note and vitals reviewed.   ED Course  Procedures (including critical care time) Labs Review Labs Reviewed - No data to display  Imaging Review Dg Foot 2 Views  Right  10/04/2014   CLINICAL DATA:  Palpable knot in the arch of the foot.  EXAM: RIGHT FOOT - 2 VIEW  COMPARISON:  None.  FINDINGS: The joint spaces are maintained. There are mild degenerative changes at the first metatarsal phalangeal joint and mild hallux valgus deformity. No acute bony abnormalities. No abnormal soft tissue calcifications. 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 MRI may be helpful for further evaluation if clinically necessary.  IMPRESSION: No acute bony findings.   Electronically Signed   By: Rudie Meyer M.D.   On:  10/04/2014 10:29     EKG Interpretation None      MDM   Final diagnoses:  Abscess of groin, left  Fibromatosis, plantar    Filed Vitals:   10/04/14 0941  BP: 143/82  Pulse: 76  Temp: 97.5 F (36.4 C)  Resp: 18   Afebrile, NAD, non-toxic appearing, AAOx4.   1) Foot pain: Patient X-Ray negative for obvious fracture or dislocation. 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 Neurovascularly intact. Normal sensation. No evidence of compartment syndrome. Pain managed in ED. Pt advised to follow up with PCP if symptoms persist for possibility of missed fracture diagnosis. Patient given ultram while in ED, conservative therapy recommended and discussed. Patient will be dc home & is agreeable with above plan.    2) Abscess: Patient with skin abscess, refuses I&D.  Encouraged home warm soaks and flushing.  Mild signs of cellulitis is surrounding skin.  Will d/c to home on antibiotics. Will refer back to PCP.    Patient is stable at time of discharge   Francee Piccolo, PA-C 10/04/14 1107  Pricilla Loveless, MD 10/05/14 [phone]

## 2014-10-18 ENCOUNTER — Other Ambulatory Visit: Payer: Self-pay | Admitting: Family Medicine

## 2014-11-14 ENCOUNTER — Telehealth: Payer: Self-pay | Admitting: Family Medicine

## 2014-11-14 ENCOUNTER — Other Ambulatory Visit: Payer: Self-pay | Admitting: Family Medicine

## 2014-11-14 MED ORDER — PANTOPRAZOLE SODIUM 40 MG PO TBEC: 40.0000 mg | DELAYED_RELEASE_TABLET | Freq: Two times a day (BID) | ORAL | Status: DC

## 2014-11-14 NOTE — Telephone Encounter (Signed)
error 

## 2014-11-14 NOTE — Telephone Encounter (Signed)
Refill request for Protonix & Flexeril.

## 2014-11-17 ENCOUNTER — Other Ambulatory Visit: Payer: Self-pay | Admitting: Family Medicine

## 2014-11-17 ENCOUNTER — Telehealth: Payer: Self-pay | Admitting: Family Medicine

## 2014-11-17 NOTE — Telephone Encounter (Signed)
Received form from [facility], provider of home health services for [name], indicating that she requested change in hours. The form requires that she be seen in the past 90 days. Unfortunately, she was seen 08/08/2014, just over 90 days ago and thus needs to be seen again to discuss the need for home health service changes.

## 2014-11-18 NOTE — Telephone Encounter (Signed)
LM for patient ok per DPR signed in chart with message regarding her need for an appt to discuss home health. Jazmin Hartsell,CMA

## 2014-11-18 NOTE — Telephone Encounter (Signed)
Need to make an appt for recertification of home health, but nothing available accept SDA slots on your continuity clinics.  Do you want to have her seen on one of those days.? Let me know something and I will contact her with appt.

## 2014-11-18 NOTE — Telephone Encounter (Signed)
I am about to be on service in September, which explains this issue. It is fine to schedule during the first available slot of any kind. Please do not double book me for this issue.

## 2014-11-23 ENCOUNTER — Encounter: Payer: Self-pay | Admitting: Internal Medicine

## 2014-11-23 ENCOUNTER — Ambulatory Visit (INDEPENDENT_AMBULATORY_CARE_PROVIDER_SITE_OTHER): Payer: Medicaid Other | Admitting: Internal Medicine

## 2014-11-23 VITALS — BP 145/86 | HR 86 | Temp 98.1°F | Ht 75.0 in | Wt 229.0 lb

## 2014-11-23 DIAGNOSIS — R2689 Other abnormalities of gait and mobility: Secondary | ICD-10-CM

## 2014-11-23 DIAGNOSIS — R29818 Other symptoms and signs involving the nervous system: Secondary | ICD-10-CM | POA: Diagnosis not present

## 2014-11-23 DIAGNOSIS — M1711 Unilateral primary osteoarthritis, right knee: Secondary | ICD-10-CM | POA: Diagnosis not present

## 2014-11-23 DIAGNOSIS — M545 Low back pain: Secondary | ICD-10-CM | POA: Diagnosis not present

## 2014-11-23 NOTE — Assessment & Plan Note (Signed)
Chronic in nature. Patient uses cane for ambulation and has seated walker at home. Has been seen by Ortho. They did injection to right knee last Wednesday 8/24. Pain was improved until Sunday when it began hurting again. She plans to follow up with ortho for other treatment options. She does not wish to obtain knee surgery.   I have put in a PT referral to evaluate patient's ability to drive safely. Patient asked for narcotics, but I denied her request. Will need to follow up with PCP regarding this concern.

## 2014-11-23 NOTE — Progress Notes (Deleted)
CSW met with pt and physician in clinic. Pt states she is currently receiving 80 hours a month and has been told by [employer] in [place] that her hours will be reduced. Pts agency is requesting for her PCP to complete the change in status form in hopes of pt not losing her hours. CSW will place PCS form in PCP's box for completion.  Hunt Oris, MSW, [place]

## 2014-11-23 NOTE — Patient Instructions (Signed)
1. Do not drive until you have been evaluated by Physical Therapy and have up to date prescription eye glasses.  2. Return to ortho for help with knee pain.  3. Social Work will be contacting you about forms needed to complete to keep your nurse. They will also try to help you get eyeglasses.   Please make an appointment to see Dr. Jarvis Newcomer in 1-2 months.

## 2014-11-23 NOTE — Progress Notes (Signed)
Subjective:    Patient ID: [name], female    DOB: [birth year], 49 y.o.   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 Negative for loss of consciousness, fevers and N/V/D.   Review of Systems   See HPI for ROS. All other systems reviewed and are negative.  Past medical history, surgical, family, and social history reviewed and updated in the EMR as appropriate. Objective:  BP 145/86 mmHg  Pulse 86  Temp(Src) 98.1 F (36.7 C) (Oral)  Ht  (1.905 m)  Wt 229 lb (103.874 kg)  BMI 28.62 kg/m2 Vitals and nursing note reviewed Physical Exam: General: Alert, cooperative, NAD. HEENT: PERRL, EOMI. Moist mucus membranes Lungs: Clear to ascultation bilaterally, normal work of respiration, no wheezes, rales, rhonchi Heart: RRR, no murmurs, gallops, or rubs Abdomen: Soft, non-tender, non-distended, BS + Extremities: Decreased active and passive ROM in right knee. Tender to palpation diffusely and crepitus with motion. Mild edema to anterior right knee. Ambulates with cane. Limp noted.  Neurologic: Alert & oriented X3, cranial nerves II-XII intact,  strength grossly intact, sensation intact to light touch  Assessment & Plan:  [name] is 49 y.o. 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  Social work discussed case with her. Seems insurance is trying to reduce the number of hours patient can have CNA at home. SW will follow up with company to figure out what paperwork is necessary in order for patient to keep home nursing aid. Additionally, [employer] will be contacted to help obtain correct prescription eye glasses for patient.   I have given patient strict instructions not to drive until she has been evaluated by PT and has appropriate prescription eyeglasses.   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 Patient uses cane for ambulation and has seated walker at home. Has been seen by Ortho. They did injection to right knee last Wednesday 8/24. Pain was improved until Sunday when it began hurting again. She plans to follow up with ortho for other treatment options. She does not wish to obtain knee surgery.   I have put in a PT referral to evaluate patient's ability to drive safely. Patient asked for narcotics, but I denied her request. Will need to follow up with PCP regarding this concern.

## 2014-11-23 NOTE — Progress Notes (Signed)
CSW met with pt and physician in clinic. Pt states she is currently receiving 80 hours a month and has been told by [employer] in [place] that her hours will be reduced. Pts agency is requesting for her PCP to complete the change in status form in hopes of pt not losing her hours. CSW will place PCS form in PCP's box for completion.  Hunt Oris, MSW, [place]

## 2014-11-30 ENCOUNTER — Telehealth: Payer: Self-pay | Admitting: Family Medicine

## 2014-11-30 DIAGNOSIS — M1711 Unilateral primary osteoarthritis, right knee: Secondary | ICD-10-CM

## 2014-11-30 NOTE — Telephone Encounter (Signed)
Pt came it.  Needs referral to another place to do her physical therapy since she has medicaid

## 2014-12-01 ENCOUNTER — Telehealth: Payer: Self-pay | Admitting: Family Medicine

## 2014-12-01 NOTE — Telephone Encounter (Signed)
Checking status of forms sent here on 8/25 for patient to continue home healthcare, if they do not get this back signed by 9/13 (Tuesday) she will be cut off from their services. Will be faxing it again today.

## 2014-12-11 NOTE — Telephone Encounter (Signed)
I have completed these forms and placed them in Theresia Bough, CSW's box for disposition.

## 2014-12-13 ENCOUNTER — Ambulatory Visit: Payer: Medicaid Other | Attending: Family Medicine | Admitting: Physical Therapy

## 2014-12-13 NOTE — Telephone Encounter (Signed)
.  Thank you Norma.

## 2014-12-13 NOTE — Telephone Encounter (Signed)
PCS form has been faxed to [employer] (both fax #'s listed on form.) They will contact pt once forms have been received in their que.  Theresia Bough, MSW, LCSW [phone]

## 2014-12-19 ENCOUNTER — Telehealth: Payer: Self-pay | Admitting: Family Medicine

## 2014-12-19 NOTE — Telephone Encounter (Signed)
Pt called because she is taking Protonix 2 pills a day. She really needs 3 pills a day. The problem is when she does this she runs out of medication ahead of time. Can we call in a new prescription to reflect the 3 times a day. jw

## 2014-12-19 NOTE — Telephone Encounter (Signed)
We need an office visit to discuss this. In short, that has not been shown to be effective and different treatments need to be discussed.

## 2014-12-20 NOTE — Telephone Encounter (Signed)
Pt called back and said that the 20 mg smaller pills do not work as well as the 40 mg she was getting before. She would like the doctor to call in the 40 mg. She has to go out of town and would not be able to get in to see the doctor until October. Please call the patient to discuss. jw

## 2014-12-21 MED ORDER — PANTOPRAZOLE SODIUM 20 MG PO TBEC: 40.0000 mg | DELAYED_RELEASE_TABLET | Freq: Every day | ORAL | Status: DC

## 2014-12-21 NOTE — Telephone Encounter (Signed)
Ordered 2 month supply. She still needs an appointment.

## 2014-12-28 ENCOUNTER — Encounter (HOSPITAL_COMMUNITY): Payer: Self-pay | Admitting: *Deleted

## 2014-12-28 ENCOUNTER — Emergency Department (HOSPITAL_COMMUNITY)
Admission: EM | Admit: 2014-12-28 | Discharge: 2014-12-28 | Disposition: A | Discharge status: Home / Self Care | Payer: Medicaid Other | Attending: Emergency Medicine | Admitting: Emergency Medicine

## 2014-12-28 ENCOUNTER — Emergency Department (HOSPITAL_COMMUNITY): Payer: Medicaid Other

## 2014-12-28 DIAGNOSIS — G8929 Other chronic pain: Secondary | ICD-10-CM | POA: Diagnosis not present

## 2014-12-28 DIAGNOSIS — Z872 Personal history of diseases of the skin and subcutaneous tissue: Secondary | ICD-10-CM | POA: Diagnosis not present

## 2014-12-28 DIAGNOSIS — Z792 Long term (current) use of antibiotics: Secondary | ICD-10-CM | POA: Insufficient documentation

## 2014-12-28 DIAGNOSIS — M79671 Pain in right foot: Secondary | ICD-10-CM | POA: Diagnosis not present

## 2014-12-28 DIAGNOSIS — Z8742 Personal history of other diseases of the female genital tract: Secondary | ICD-10-CM | POA: Diagnosis not present

## 2014-12-28 DIAGNOSIS — K219 Gastro-esophageal reflux disease without esophagitis: Secondary | ICD-10-CM | POA: Diagnosis not present

## 2014-12-28 DIAGNOSIS — Z79899 Other long term (current) drug therapy: Secondary | ICD-10-CM | POA: Insufficient documentation

## 2014-12-28 DIAGNOSIS — Z720 Tobacco use: Secondary | ICD-10-CM | POA: Diagnosis not present

## 2014-12-28 DIAGNOSIS — F410 Panic disorder [episodic paroxysmal anxiety] without agoraphobia: Secondary | ICD-10-CM | POA: Insufficient documentation

## 2014-12-28 DIAGNOSIS — M1990 Unspecified osteoarthritis, unspecified site: Secondary | ICD-10-CM | POA: Insufficient documentation

## 2014-12-28 DIAGNOSIS — Z8679 Personal history of other diseases of the circulatory system: Secondary | ICD-10-CM | POA: Insufficient documentation

## 2014-12-28 DIAGNOSIS — M7989 Other specified soft tissue disorders: Secondary | ICD-10-CM | POA: Diagnosis present

## 2014-12-28 DIAGNOSIS — F329 Major depressive disorder, single episode, unspecified: Secondary | ICD-10-CM | POA: Insufficient documentation

## 2014-12-28 DIAGNOSIS — Z8669 Personal history of other diseases of the nervous system and sense organs: Secondary | ICD-10-CM | POA: Insufficient documentation

## 2014-12-28 MED ORDER — OXYCODONE-ACETAMINOPHEN 5-325 MG PO TABS
1.0000 | ORAL_TABLET | Freq: Once | ORAL | Status: AC
  Administered 2014-12-28: 1 via ORAL
  Filled 2014-12-28: qty 1

## 2014-12-28 MED ORDER — INDOMETHACIN 25 MG PO CAPS
50.0000 mg | ORAL_CAPSULE | Freq: Once | ORAL | Status: AC
  Administered 2014-12-28: 50 mg via ORAL
  Filled 2014-12-28: qty 2

## 2014-12-28 MED ORDER — INDOMETHACIN 25 MG PO CAPS: 25.0000 mg | ORAL_CAPSULE | Freq: Three times a day (TID) | ORAL | Status: DC | PRN

## 2014-12-28 NOTE — ED Notes (Signed)
2 blankets rolled placed under patient's right ankle

## 2014-12-28 NOTE — ED Notes (Signed)
Patient taken to bathroom via wheelchair with family in tow to assist her in the bathroom

## 2014-12-28 NOTE — ED Provider Notes (Signed)
CSN: [id number]     Arrival date & time 12/28/14  0912 History  By signing my name below, I, Budd Palmer, attest that this documentation has been prepared under the direction and in the presence of [employer], PA-C. Electronically Signed: Budd Palmer, ED Scribe. 12/28/2014. 9:44 AM.    Chief Complaint  Patient presents with  . Gout   The history is provided by the patient. No language interpreter was used.   HPI Comments: LENNYN GANGE is a 49 y.o. female with a PMHx of arthritis who presents to the Emergency Department complaining of gout in the right little toe onset 1 day ago. She reports associated swelling to all the toes of the right foot, as well as to the right ankle. She notes a PMHx of gout in her toes and fingers and notes that this feels similar. She states she has contacted her PCP who did not have an appointment available and told her to come to the ED. She denies any recent injuries to the foot or toes.    Past Medical History  Diagnosis Date  . Depression   . Anxiety   . Panic attacks   . GERD (gastroesophageal reflux disease)   . Arthritis   . VARICOSE VEINS, LOWER EXTREMITIES 06/30/2006    Qualifier: Diagnosis of  By: Seleta Rhymes MD, Loraine Leriche    . TOBACCO ABUSE 04/13/2008  . 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 OVARIAN CYST 08/12/2007    Qualifier: Diagnosis of  By: Seleta Rhymes MD, Loraine Leriche    . LOW BACK PAIN, CHRONIC 06/30/2006    Qualifier: Diagnosis of  By: Seleta Rhymes MD, Loraine Leriche    . 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 Knee cartilage surgery  2013    right    Family History  Problem Relation Age of Onset  . Hypertension Mother   . Diabetes Mother   . Diabetes Father   . Hypertension Father    Social History  Substance Use Topics  .  Smoking status: Current Every Day Smoker -- 1.00 packs/day    Types: Cigarettes  . Smokeless tobacco: Never Used  . Alcohol Use: Yes     Comment: Drinks <10 monthly   OB History    No data available     Review of Systems  Constitutional: Negative for fever.  Musculoskeletal: Positive for joint swelling and arthralgias.  Skin: Negative for wound.    Allergies  Review of patient's allergies indicates no known allergies.  Home Medications   Prior to Admission medications   Medication Sig Start Date End Date Taking? Authorizing Provider  ALPRAZolam Prudy Feeler) 1 MG tablet Take 1 mg by mouth 4 (four) times daily as needed for anxiety. For anxiety    Historical Provider, MD  amoxicillin (AMOXIL) 500 MG capsule Take 1 capsule (500 mg total) by mouth 3 (three) times daily. 05/19/14   Twana First Hess, DO  clindamycin (CLEOCIN) 150 MG capsule Take 2 capsules (300 mg total) by mouth 3 (three) times daily. May dispense as 150mg  capsules 10/04/14   Jennifer Piepenbrink, PA-C  cyclobenzaprine (FLEXERIL) 10 MG tablet TAKE 1 TABLET (10 MG TOTAL) BY MOUTH 3 (THREE) TIMES DAILY AS NEEDED FOR MUSCLE SPASMS. 07/14/14   Bobbye Morton, MD  divalproex (DEPAKOTE ER) 500 MG 24 hr tablet Take 500 mg by mouth at bedtime.     Historical Provider, MD  naproxen sodium (ANAPROX) 220 MG tablet Take 220-440 mg by mouth 2 (two) times daily as needed (for pain).    Historical Provider, MD  pantoprazole (PROTONIX) 20 MG tablet Take 2 tablets (40 mg total) by mouth daily. 12/21/14   Tyrone Nine, MD  traMADol (ULTRAM) 50 MG tablet Take 1 tablet (50 mg total) by mouth every 8 (eight) hours as needed. 05/19/14   Briscoe Deutscher, DO  traMADol (ULTRAM) 50 MG tablet Take 1 tablet (50 mg total) by mouth every 6 (six) hours as needed. 10/04/14   Jennifer Piepenbrink, PA-C  ziprasidone (GEODON) 60 MG capsule Take 60 mg by mouth 2 (two) times daily with a meal.    Historical Provider, MD   BP 120/74 mmHg  Pulse 71  Temp(Src) 97.7 F  (36.5 C) (Oral)  Resp 18  SpO2 97%  LMP 12/21/2014 Physical Exam  Constitutional: She is oriented to person, place, and time. She appears well-developed and well-nourished.  HENT:  Head: Normocephalic.  Eyes: EOM are normal.  Neck: Normal range of motion.  Pulmonary/Chest: Effort normal.  Abdominal: She exhibits no distension.  Musculoskeletal: Normal range of motion.  Mild swelling noted over her fourth and fifth toes. No redness, no warmth to touch. Tenderness to palpation over fifth, fourth, third, second toes, as well as second through fourth metatarsals. Normal ankle. 4 George of the ankle. Range of motion of the fifth of 4 toes.  Neurological: She is alert and oriented to person, place, and time.  Psychiatric: She has a normal mood and affect.  Nursing note and vitals reviewed.   ED Course  Procedures  DIAGNOSTIC STUDIES: Oxygen Saturation is 97% on RA, adequate by my interpretation.    COORDINATION OF CARE: 9:37 AM - Discussed plans to order diagnostic imaging and pain medication. Pt advised of plan for treatment and pt agrees.  Labs Review Labs Reviewed - No data to display  Imaging Review Dg Foot 2 Views Right  12/28/2014   CLINICAL DATA:  Sharp pain in fifth digit from gout starting yesterday  EXAM: RIGHT FOOT - 2 VIEW  COMPARISON:  10/04/2014  FINDINGS: Question mild soft tissue swelling at lateral aspect of distal foot into toes.  Osseous mineralization normal.  Joint spaces preserved.  No acute fracture, dislocation or bone destruction.  IMPRESSION: No acute osseous abnormalities.   Electronically Signed   By: Ulyses Southward M.D.   On: 12/28/2014 10:50   I have personally reviewed and evaluated these images and lab results as part of my medical decision-making.   EKG Interpretation None      MDM   Final diagnoses:  Right foot pain    patient with nontraumatic foot pain, states that his gout, feels the same as last doubt exacerbation. X-ray obtained to rule out  fractures. It is negative. Patient given Percocet and Indocin as emergency department. We'll discharge home with indomethacin. Instructed to stop all other NSAIDs. Patient has a lot of chronic pain due to arthritis and prior injuries.  she will need to follow-up with pain management for chronic pain treatment. Patient stated "I have really bad anxiety and it makes me anxious to wait for an appointment to pain management." She is requesting narcotic medications but advised that Follow with primary care doctor.  Filed Vitals:   12/28/14 0942 12/28/14 1109  BP: 120/74 147/82  Pulse:  71 64  Temp: 97.7 F (36.5 C)   TempSrc: Oral   Resp: 18 18  SpO2: 97% 100%   I personally performed the services described in this documentation, which was scribed in my presence. The recorded information has been reviewed and is accurate.   Jaynie Crumble, PA-C 12/28/14 1321  Azalia Bilis, MD 12/28/14 1714

## 2014-12-28 NOTE — Discharge Instructions (Signed)
Take indocin as prescribe for gout. Do not take any ibuprofen or naprosyn while taking this medication. Take tylenol for additional pain relief. Follow up with primary care doctor.     Gout Gout is an inflammatory arthritis caused by a buildup of uric acid crystals in the joints. Uric acid is a chemical that is normally present in the blood. When the level of uric acid in the blood is too high it can form crystals that deposit in your joints and tissues. This causes joint redness, soreness, and swelling (inflammation). Repeat attacks are common. Over time, uric acid crystals can form into masses (tophi) near a joint, destroying bone and causing disfigurement. Gout is treatable and often preventable. CAUSES  The disease begins with elevated levels of uric acid in the blood. Uric acid is produced by your body when it breaks down a naturally found substance called purines. Certain foods you eat, such as meats and fish, contain high amounts of purines. Causes of an elevated uric acid level include:  Being passed down from parent to child (heredity).  Diseases that cause increased uric acid production (such as obesity, psoriasis, and certain cancers).  Excessive alcohol use.  Diet, especially diets rich in meat and seafood.  Medicines, including certain cancer-fighting medicines (chemotherapy), water pills (diuretics), and aspirin.  Chronic kidney disease. The kidneys are no longer able to remove uric acid well.  Problems with metabolism. Conditions strongly associated with gout include:  Obesity.  High blood pressure.  High cholesterol.  Diabetes. Not everyone with elevated uric acid levels gets gout. It is not understood why some people get gout and others do not. Surgery, joint injury, and eating too much of certain foods are some of the factors that can lead to gout attacks. SYMPTOMS   An attack of gout comes on quickly. It causes intense pain with redness, swelling, and warmth in a  joint.  Fever can occur.  Often, only one joint is involved. Certain joints are more commonly involved:  Base of the big toe.  Knee.  Ankle.  Wrist.  Finger. Without treatment, an attack usually goes away in a few days to weeks. Between attacks, you usually will not have symptoms, which is different from many other forms of arthritis. DIAGNOSIS  Your caregiver will suspect gout based on your symptoms and exam. In some cases, tests may be recommended. The tests may include:  Blood tests.  Urine tests.  X-rays.  Joint fluid exam. This exam requires a needle to remove fluid from the joint (arthrocentesis). Using a microscope, gout is confirmed when uric acid crystals are seen in the joint fluid. TREATMENT  There are two phases to gout treatment: treating the sudden onset (acute) attack and preventing attacks (prophylaxis).  Treatment of an Acute Attack.  Medicines are used. These include anti-inflammatory medicines or steroid medicines.  An injection of steroid medicine into the affected joint is sometimes necessary.  The painful joint is rested. Movement can worsen the arthritis.  You may use warm or cold treatments on painful joints, depending which works best for you.  Treatment to Prevent Attacks.  If you suffer from frequent gout attacks, your caregiver may advise preventive medicine. These medicines are started after the acute attack subsides. These medicines either help your kidneys eliminate uric acid from your body or decrease your uric acid production. You may need to stay on these medicines for a very long time.  The early phase of treatment with preventive medicine can be associated with an  increase in acute gout attacks. For this reason, during the first few months of treatment, your caregiver may also advise you to take medicines usually used for acute gout treatment. Be sure you understand your caregiver's directions. Your caregiver may make several adjustments  to your medicine dose before these medicines are effective.  Discuss dietary treatment with your caregiver or dietitian. Alcohol and drinks high in sugar and fructose and foods such as meat, poultry, and seafood can increase uric acid levels. Your caregiver or dietitian can advise you on drinks and foods that should be limited. HOME CARE INSTRUCTIONS   Do not take aspirin to relieve pain. This raises uric acid levels.  Only take over-the-counter or prescription medicines for pain, discomfort, or fever as directed by your caregiver.  Rest the joint as much as possible. When in bed, keep sheets and blankets off painful areas.  Keep the affected joint raised (elevated).  Apply warm or cold treatments to painful joints. Use of warm or cold treatments depends on which works best for you.  Use crutches if the painful joint is in your leg.  Drink enough fluids to keep your urine clear or pale yellow. This helps your body get rid of uric acid. Limit alcohol, sugary drinks, and fructose drinks.  Follow your dietary instructions. Pay careful attention to the amount of protein you eat. Your daily diet should emphasize fruits, vegetables, whole grains, and fat-free or low-fat milk products. Discuss the use of coffee, vitamin C, and cherries with your caregiver or dietitian. These may be helpful in lowering uric acid levels.  Maintain a healthy body weight. SEEK MEDICAL CARE IF:   You develop diarrhea, vomiting, or any side effects from medicines.  You do not feel better in 24 hours, or you are getting worse. SEEK IMMEDIATE MEDICAL CARE IF:   Your joint becomes suddenly more tender, and you have chills or a fever. MAKE SURE YOU:   Understand these instructions.  Will watch your condition.  Will get help right away if you are not doing well or get worse.   This information is not intended to replace advice given to you by your health care provider. Make sure you discuss any questions you have  with your health care provider.   Document Released: 03/08/2000 Document Revised: 04/01/2014 Document Reviewed: 10/23/2011 Elsevier Interactive Patient Education [employer]

## 2014-12-28 NOTE — ED Notes (Signed)
Declined W/C at D/C and was escorted to lobby by RN. 

## 2014-12-28 NOTE — ED Notes (Signed)
PT reports pain in RT little toe. Pt reports pain started last night. Pt reports she has had gout before and the pain feels the same.

## 2015-01-19 ENCOUNTER — Ambulatory Visit (INDEPENDENT_AMBULATORY_CARE_PROVIDER_SITE_OTHER): Payer: Medicaid Other | Admitting: Family Medicine

## 2015-01-19 VITALS — BP 124/82 | HR 75 | Temp 98.2°F | Wt 238.8 lb

## 2015-01-19 DIAGNOSIS — M25561 Pain in right knee: Secondary | ICD-10-CM | POA: Diagnosis not present

## 2015-01-19 DIAGNOSIS — M545 Low back pain: Secondary | ICD-10-CM | POA: Diagnosis not present

## 2015-01-19 MED ORDER — PREDNISONE 50 MG PO TABS: 50.0000 mg | ORAL_TABLET | Freq: Every day | ORAL | Status: DC

## 2015-01-19 MED ORDER — COLCHICINE 0.6 MG PO TABS: 0.6000 mg | ORAL_TABLET | Freq: Every day | ORAL | Status: DC

## 2015-01-19 NOTE — Patient Instructions (Signed)
Thank you for coming to see me today. It was a pleasure. Today we talked about:   Knee pain: I will treat this as a gout flare. Please discontinue taking the Aleve and Indomethacin while taking Colchicine. Take two tablets today, then one daily. Please return if no improvement.  Please make an appointment to see Dr. Jarvis NewcomerGrunz for follow-up  If you have any questions or concerns, please do not hesitate to call the office at [phone].  Sincerely,  Jacquelin Hawkingalph Tashia Leiterman, MD

## 2015-01-19 NOTE — Progress Notes (Signed)
    Subjective   [name] is a 49 y.o. female that presents for a same day visit  1. Right knee pain: Patient reports acute exacerbation of a chronic issue. [facility]th for corticosteroid injection. She has declined surgery. Patient has been using Tramadol 50mg  , indomethacin 25mg  as needed, Aleve 220 three times a day. She uses a cane and a four wheel walker for ambulation.   ROS Per HPI  Social History  Substance Use Topics  . Smoking status: Current Every Day Smoker -- 1.00 packs/day    Types: Cigarettes  . Smokeless tobacco: Never Used  . Alcohol Use: Yes     Comment: Drinks <10 monthly    No Known Allergies  Objective   BP 124/82 mmHg  Pulse 75  Temp(Src) 98.2 F (36.8 C) (Oral)  Wt 238 lb 12.8 oz (108.319 kg)  LMP 12/21/2014  General: Well appearing Musculoskeletal: Right knee appears slightly swollen compared to left. Tenderness along superior and medial aspects. No lateral joint line tenderness. Slight warmth on medial aspect. No patellar tenderness or deformity. Full flexion and extension of knee actively and passively. Negative lachman's, posterior/anterior drawer, valgus/varus. Strength 4/5.   Assessment and Plan   Meds ordered this encounter  Medications  . DISCONTD: predniSONE (DELTASONE) 50 MG tablet    Sig: Take 1 tablet (50 mg total) by mouth daily with breakfast.    Dispense:  7 tablet    Refill:  0  . colchicine 0.6 MG tablet    Sig: Take 1 tablet (0.6 mg total) by mouth daily. Take two tablets twoday    Dispense:  31 tablet    Refill:  0    Right knee pain: precepted with Dr. Gwendolyn GrantWalden. Does not appear to be septic. Possible gout flare. Last uric acid 4.9 in 2012. Will treat as such with colchicine. Patient declined knee aspiration. Return precautions discussed.

## 2015-02-22 ENCOUNTER — Encounter: Payer: Self-pay | Admitting: Family Medicine

## 2015-02-22 ENCOUNTER — Ambulatory Visit (INDEPENDENT_AMBULATORY_CARE_PROVIDER_SITE_OTHER): Payer: Medicaid Other | Admitting: Family Medicine

## 2015-02-22 VITALS — BP 129/77 | HR 80 | Temp 98.1°F | Wt 232.0 lb

## 2015-02-22 DIAGNOSIS — L0291 Cutaneous abscess, unspecified: Secondary | ICD-10-CM

## 2015-02-22 DIAGNOSIS — L0390 Cellulitis, unspecified: Secondary | ICD-10-CM | POA: Diagnosis not present

## 2015-02-22 DIAGNOSIS — M545 Low back pain: Secondary | ICD-10-CM | POA: Diagnosis not present

## 2015-02-22 MED ORDER — DOXYCYCLINE HYCLATE 100 MG PO TABS: 100.0000 mg | ORAL_TABLET | Freq: Two times a day (BID) | ORAL | Status: AC

## 2015-02-22 NOTE — Patient Instructions (Signed)
Good to see you today!  Thanks for coming in.  Use Lever 2000 soap to help prevent boils  Do not shave as this will increase the risk of boils  Use warm compresses and gentle massage on this boil  Take the antibiotics for 5 days twice daily  If high fever or boil does not resolve come back or go to ER

## 2015-02-22 NOTE — Progress Notes (Signed)
   Subjective:    Patient ID: [name], female    DOB: 01/11/1966, 49 y.o.   MRN: [id number]  HPI  Boil In R groin for last 2 days has started to drain small amount.  Has had in past.  No fever or nausea vomiting or chills.  Does shave groin.  No recent antibiotics   Chief Complaint noted Review of Symptoms - see HPI PMH - Smoking status noted.   Vital Signs reviewed    Review of Systems     Objective:   Physical Exam  Alert nad R thigh crease 2 cm abscess with small pin head drainage inferior boil.  Induration surrounding the boil appears to be chronic without erythema or pain  Patient refuses to have any packing - wants only incision.  Insists will then manage it on her own.  Explained this might make the boil more likely to return and could cause the infection to spread   Procedure - cleaned with betaidene and etoh area numbed with cold spray  Time out taken One cm incsiion made with expression of  4 Cc of purulent material.  No loculated pockets remained Patient tolerated well    Assessment & Plan:

## 2015-03-18 ENCOUNTER — Emergency Department (HOSPITAL_COMMUNITY)
Admission: EM | Admit: 2015-03-18 | Discharge: 2015-03-18 | Disposition: A | Discharge status: Home / Self Care | Payer: Medicaid Other | Attending: Emergency Medicine | Admitting: Emergency Medicine

## 2015-03-18 ENCOUNTER — Encounter (HOSPITAL_COMMUNITY): Payer: Self-pay

## 2015-03-18 ENCOUNTER — Emergency Department (HOSPITAL_COMMUNITY): Payer: Medicaid Other

## 2015-03-18 DIAGNOSIS — S59901A Unspecified injury of right elbow, initial encounter: Secondary | ICD-10-CM | POA: Diagnosis present

## 2015-03-18 DIAGNOSIS — F329 Major depressive disorder, single episode, unspecified: Secondary | ICD-10-CM | POA: Diagnosis not present

## 2015-03-18 DIAGNOSIS — S299XXA Unspecified injury of thorax, initial encounter: Secondary | ICD-10-CM | POA: Insufficient documentation

## 2015-03-18 DIAGNOSIS — S8991XA Unspecified injury of right lower leg, initial encounter: Secondary | ICD-10-CM | POA: Insufficient documentation

## 2015-03-18 DIAGNOSIS — Z79899 Other long term (current) drug therapy: Secondary | ICD-10-CM | POA: Diagnosis not present

## 2015-03-18 DIAGNOSIS — G8929 Other chronic pain: Secondary | ICD-10-CM | POA: Diagnosis not present

## 2015-03-18 DIAGNOSIS — F410 Panic disorder [episodic paroxysmal anxiety] without agoraphobia: Secondary | ICD-10-CM | POA: Insufficient documentation

## 2015-03-18 DIAGNOSIS — M545 Low back pain, unspecified: Secondary | ICD-10-CM

## 2015-03-18 DIAGNOSIS — Y998 Other external cause status: Secondary | ICD-10-CM | POA: Insufficient documentation

## 2015-03-18 DIAGNOSIS — Y92410 Unspecified street and highway as the place of occurrence of the external cause: Secondary | ICD-10-CM | POA: Diagnosis not present

## 2015-03-18 DIAGNOSIS — Z872 Personal history of diseases of the skin and subcutaneous tissue: Secondary | ICD-10-CM | POA: Insufficient documentation

## 2015-03-18 DIAGNOSIS — F17210 Nicotine dependence, cigarettes, uncomplicated: Secondary | ICD-10-CM | POA: Insufficient documentation

## 2015-03-18 DIAGNOSIS — S3992XA Unspecified injury of lower back, initial encounter: Secondary | ICD-10-CM | POA: Insufficient documentation

## 2015-03-18 DIAGNOSIS — M1990 Unspecified osteoarthritis, unspecified site: Secondary | ICD-10-CM | POA: Diagnosis not present

## 2015-03-18 DIAGNOSIS — Y9389 Activity, other specified: Secondary | ICD-10-CM | POA: Diagnosis not present

## 2015-03-18 DIAGNOSIS — Z8679 Personal history of other diseases of the circulatory system: Secondary | ICD-10-CM | POA: Insufficient documentation

## 2015-03-18 DIAGNOSIS — M25561 Pain in right knee: Secondary | ICD-10-CM

## 2015-03-18 DIAGNOSIS — M25529 Pain in unspecified elbow: Secondary | ICD-10-CM

## 2015-03-18 DIAGNOSIS — Z8742 Personal history of other diseases of the female genital tract: Secondary | ICD-10-CM | POA: Insufficient documentation

## 2015-03-18 DIAGNOSIS — K219 Gastro-esophageal reflux disease without esophagitis: Secondary | ICD-10-CM | POA: Diagnosis not present

## 2015-03-18 MED ORDER — METHOCARBAMOL 500 MG PO TABS
500.0000 mg | ORAL_TABLET | Freq: Once | ORAL | Status: AC
  Administered 2015-03-18: 500 mg via ORAL
  Filled 2015-03-18: qty 1

## 2015-03-18 MED ORDER — ACETAMINOPHEN 325 MG PO TABS
650.0000 mg | ORAL_TABLET | Freq: Once | ORAL | Status: AC
  Administered 2015-03-18: 650 mg via ORAL
  Filled 2015-03-18: qty 2

## 2015-03-18 NOTE — Progress Notes (Signed)
Orthopedic Tech Progress Note Patient Details:  Cathy FlackSharon L Simon 03/05/1966 [id number]  Ortho Devices Type of Ortho Device: Ace wrap Ortho Device/Splint Interventions: Application   Kirby Cortese 03/18/2015, 8:21 AM

## 2015-03-18 NOTE — ED Notes (Signed)
Ortho Tech at the bedside ° °

## 2015-03-18 NOTE — ED Notes (Signed)
Waiting for Ortho Tech to apply elbow brace.
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
## 2015-03-30 ENCOUNTER — Ambulatory Visit (INDEPENDENT_AMBULATORY_CARE_PROVIDER_SITE_OTHER): Payer: Self-pay | Admitting: Internal Medicine

## 2015-03-30 NOTE — Progress Notes (Signed)
Patient arrived then rescheduled appointment. Encounter opened in error.   This encounter was created in error - please disregard.

## 2015-03-31 ENCOUNTER — Encounter: Payer: Self-pay | Admitting: Internal Medicine

## 2015-03-31 ENCOUNTER — Ambulatory Visit (INDEPENDENT_AMBULATORY_CARE_PROVIDER_SITE_OTHER): Payer: Self-pay | Admitting: Internal Medicine

## 2015-03-31 VITALS — BP 130/84 | HR 77 | Temp 98.6°F | Ht 75.0 in | Wt 230.0 lb

## 2015-03-31 DIAGNOSIS — M25521 Pain in right elbow: Secondary | ICD-10-CM

## 2015-03-31 DIAGNOSIS — M25561 Pain in right knee: Secondary | ICD-10-CM

## 2015-03-31 DIAGNOSIS — M545 Low back pain: Secondary | ICD-10-CM

## 2015-03-31 DIAGNOSIS — M546 Pain in thoracic spine: Secondary | ICD-10-CM

## 2015-03-31 DIAGNOSIS — M79621 Pain in right upper arm: Secondary | ICD-10-CM

## 2015-03-31 MED ORDER — NAPROXEN 500 MG PO TABS: 500.0000 mg | ORAL_TABLET | Freq: Two times a day (BID) | ORAL | Status: DC

## 2015-03-31 MED ORDER — CYCLOBENZAPRINE HCL 10 MG PO TABS: ORAL_TABLET | ORAL | Status: DC
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
## 2015-04-01 NOTE — Assessment & Plan Note (Signed)
>>  ASSESSMENT AND PLAN FOR KNEE PAIN, RIGHT, CHRONIC WRITTEN ON 04/01/2015  3:40 PM BY FITZGERALD, HILLARY MOEN, MD  - Prescribed naproxen 500 mg BID prn.  - Reassured patient that no fractures were seen on imaging and that acute pain should get better with time and that definitive treatment for her chronic OA was to continue with knee replacement surgery, as planned - Continue to wear knee brace for support

## 2015-04-01 NOTE — Assessment & Plan Note (Addendum)
-   Prescribed naproxen 500 mg BID prn.  - Reassured patient that no fractures were seen on imaging and that acute pain should get better with time and that definitive treatment for her chronic OA was to continue with knee replacement surgery, as planned - Continue to wear knee brace for support

## 2015-04-01 NOTE — Assessment & Plan Note (Signed)
-   Refilled flexeril prescription, as patient states it has provided some relief

## 2015-04-01 NOTE — Assessment & Plan Note (Signed)
-   Prescribed naproxen, as above - Provided new ACE bandage

## 2015-04-13 ENCOUNTER — Other Ambulatory Visit: Payer: Self-pay | Admitting: Family Medicine

## 2015-04-13 NOTE — Telephone Encounter (Signed)
Pt needs refill on pantoprazole (PROTONIX) 20 MG tablet [name], ASA

## 2015-04-14 ENCOUNTER — Other Ambulatory Visit: Payer: Self-pay | Admitting: Family Medicine

## 2015-04-14 NOTE — Telephone Encounter (Signed)
Pt is checking the status of her request for Protonix. jw

## 2015-04-17 ENCOUNTER — Ambulatory Visit (INDEPENDENT_AMBULATORY_CARE_PROVIDER_SITE_OTHER): Payer: Medicaid Other | Admitting: Family Medicine

## 2015-04-17 ENCOUNTER — Encounter: Payer: Self-pay | Admitting: Family Medicine

## 2015-04-17 VITALS — BP 141/85 | HR 76 | Temp 97.7°F | Ht 75.0 in | Wt 235.4 lb

## 2015-04-17 DIAGNOSIS — M545 Low back pain: Secondary | ICD-10-CM | POA: Diagnosis present

## 2015-04-17 DIAGNOSIS — K219 Gastro-esophageal reflux disease without esophagitis: Secondary | ICD-10-CM

## 2015-04-17 NOTE — Patient Instructions (Signed)
Thank you so much for coming to visit today! Your protonix is filled and waiting at the pharmacy.  Please let us know if there is anything else we can do for you!  Thanks again! Dr. Caroleen Hamman

## 2015-04-17 NOTE — Assessment & Plan Note (Signed)
-   Protonix refilled by PCP this morning - Pharmacy contacted and has prescription ready for pickup - Patient to go pick up prescription from pharmacy

## 2015-04-17 NOTE — Progress Notes (Signed)
Subjective:     Patient ID: [name], female   DOB: Mar 30, 1965, 50 y.o.   MRN: [id number]  HPI [name] is a 50yo female presenting today for refill of Protonix. - Notes history of GERD and not being able to burp, which Protonix helps significantly with. - Denies abdominal pain at this time. Also denies nausea, vomiting, fevers. - States she has had a history of GERD for many years - Has been out of Protonix since 04/13/15 - Chart review shows Protonix sent to pharmacy by PCP this morning  Review of Systems Per HPI. Other systems negative.    Objective:   Physical Exam  Constitutional: She appears well-developed and well-nourished. No distress.  Abdominal: Soft. Bowel sounds are normal. She exhibits no distension and no mass. There is no tenderness.  Psychiatric: She has a normal mood and affect. Her behavior is normal.  Initially irritated, however calmed during visit.       Assessment and Plan:     GERD (gastroesophageal reflux disease) - Protonix refilled by PCP this morning - Pharmacy contacted and has prescription ready for pickup - Patient to go pick up prescription from pharmacy

## 2015-05-14 ENCOUNTER — Encounter (HOSPITAL_COMMUNITY): Payer: Self-pay

## 2015-05-14 ENCOUNTER — Emergency Department (HOSPITAL_COMMUNITY)
Admission: EM | Admit: 2015-05-14 | Discharge: 2015-05-14 | Disposition: A | Discharge status: Home / Self Care | Payer: Medicaid Other | Attending: Emergency Medicine | Admitting: Emergency Medicine

## 2015-05-14 DIAGNOSIS — R519 Headache, unspecified: Secondary | ICD-10-CM

## 2015-05-14 DIAGNOSIS — M1990 Unspecified osteoarthritis, unspecified site: Secondary | ICD-10-CM | POA: Insufficient documentation

## 2015-05-14 DIAGNOSIS — F319 Bipolar disorder, unspecified: Secondary | ICD-10-CM | POA: Insufficient documentation

## 2015-05-14 DIAGNOSIS — Y9289 Other specified places as the place of occurrence of the external cause: Secondary | ICD-10-CM | POA: Insufficient documentation

## 2015-05-14 DIAGNOSIS — Z8679 Personal history of other diseases of the circulatory system: Secondary | ICD-10-CM | POA: Diagnosis not present

## 2015-05-14 DIAGNOSIS — Z79899 Other long term (current) drug therapy: Secondary | ICD-10-CM | POA: Diagnosis not present

## 2015-05-14 DIAGNOSIS — G8929 Other chronic pain: Secondary | ICD-10-CM | POA: Diagnosis not present

## 2015-05-14 DIAGNOSIS — S0990XA Unspecified injury of head, initial encounter: Secondary | ICD-10-CM | POA: Insufficient documentation

## 2015-05-14 DIAGNOSIS — F410 Panic disorder [episodic paroxysmal anxiety] without agoraphobia: Secondary | ICD-10-CM | POA: Insufficient documentation

## 2015-05-14 DIAGNOSIS — Y998 Other external cause status: Secondary | ICD-10-CM | POA: Diagnosis not present

## 2015-05-14 DIAGNOSIS — Y9389 Activity, other specified: Secondary | ICD-10-CM | POA: Insufficient documentation

## 2015-05-14 DIAGNOSIS — Z791 Long term (current) use of non-steroidal anti-inflammatories (NSAID): Secondary | ICD-10-CM | POA: Insufficient documentation

## 2015-05-14 DIAGNOSIS — Z8742 Personal history of other diseases of the female genital tract: Secondary | ICD-10-CM | POA: Insufficient documentation

## 2015-05-14 DIAGNOSIS — F17210 Nicotine dependence, cigarettes, uncomplicated: Secondary | ICD-10-CM | POA: Insufficient documentation

## 2015-05-14 DIAGNOSIS — R51 Headache: Secondary | ICD-10-CM

## 2015-05-14 DIAGNOSIS — Z872 Personal history of diseases of the skin and subcutaneous tissue: Secondary | ICD-10-CM | POA: Diagnosis not present

## 2015-05-14 HISTORY — DX: Bipolar disorder, unspecified: F31.9

## 2015-05-14 MED ORDER — HYDROCODONE-ACETAMINOPHEN 5-325 MG PO TABS
1.0000 | ORAL_TABLET | Freq: Once | ORAL | Status: AC
  Administered 2015-05-14: 1 via ORAL
  Filled 2015-05-14: qty 1

## 2015-05-14 MED ORDER — BUTALBITAL-APAP-CAFFEINE 50-325-40 MG PO TABS: 1.0000 | ORAL_TABLET | Freq: Two times a day (BID) | ORAL | Status: AC

## 2015-05-14 MED ORDER — ONDANSETRON 4 MG PO TBDP
4.0000 mg | ORAL_TABLET | Freq: Once | ORAL | Status: AC
  Administered 2015-05-14: 4 mg via ORAL
  Filled 2015-05-14: qty 1
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
## 2015-05-14 NOTE — Discharge Instructions (Signed)
You are having a headache. No specific cause was found today for your headache. It may have been a migraine or other cause of headache. Stress, anxiety, fatigue, and depression are common triggers for headaches. Your headache today does not appear to be life-threatening or require hospitalization, but often the exact cause of headaches is not determined in the emergency department. Therefore, follow-up with your doctor is very important to find out what may have caused your headache, and whether or not you need any further diagnostic testing or treatment. Sometimes headaches can appear benign (not harmful), but then more serious symptoms can develop which should prompt an immediate re-evaluation by your doctor or the emergency department. SEEK MEDICAL ATTENTION IF: You develop possible problems with medications prescribed.  The medications don't resolve your headache, if it recurs , or if you have multiple episodes of vomiting or can't take fluids. You have a change from the usual headache. RETURN IMMEDIATELY IF you develop a sudden, severe headache or confusion, become poorly responsive or faint, develop a fever above 100.45F or problem breathing, have a change in speech, vision, swallowing, or understanding, or develop new weakness, numbness, tingling, incoordination, or have a seizure.\ Analgesic Rebound Headaches An analgesic rebound headache is a headache that returns after pain medicine (analgesic) that was taken to treat the initial headache wears off. People who suffer from tension, migraine, or cluster headaches are at risk for developing rebound headaches. Any type of primary headache can return as a rebound headache if you regularly take analgesics more than three times a week. If the cycle of rebound headaches continues, they become chronic daily headaches.  CAUSES Analgesics frequently associated with this problem include common over-the-counter medicines like aspirin, ibuprofen, acetaminophen,  sinus relief medicines, and other medicines that contain caffeine. Narcotic pain medicines are also a common cause of rebound headaches.  SIGNS AND SYMPTOMS The symptoms of rebound headaches are the same as the symptoms of your initial headache. Symptoms of specific types of headaches include: Tension headache  Pressure around the head.  Dull, aching head pain.  Pain felt over the front and sides of the head.  Tenderness in the muscles of the head, neck and shoulders. Migraine Headache  Pulsing or throbbing pain on one or both sides of the head.  Severe pain that interferes with daily activities.  Pain that is worsened by physical activity.  Nausea, vomiting, or both.  Pain with exposure to bright light, loud noises, or strong smells.  General sensitivity to bright light, loud noises, or strong smells.  Visual changes.  Numbness of one or both arms. Cluster Headaches  Severe pain that begins in or around one eye or temple.  Redness in the eye on the same side as the pain.  Droopy or swollen eyelid.  One-sided head pain.  Nausea.  Runny nose.  Sweaty, pale facial skin.  Restlessness. DIAGNOSIS  Analgesic rebound headaches are diagnosed by reviewing your medical history. This includes the nature of your initial headaches, as well as the type of pain medicines you have been using to treat your headaches and how often you take them. TREATMENT Discontinuing frequent use of the analgesic medicine will typically reduce the frequency of the rebound episodes. This may initially worsen your headaches but eventually the pain should become more manageable, less frequent, and less severe.  Seeing a headache specialists may helpful. He or she may be able to help you manage your headaches and to make sure there is not another cause of the  headaches. Alternative methods of stress relief such as acupuncture, counseling, biofeedback, and massage may also be helpful. Talk with your  health care provider about which alternative treatments might be good for you. HOME CARE INSTRUCTIONS Stopping the regular use of pain medicine can be difficult. Follow your health care provider's instructions carefully. Keep all of your appointments. Avoid triggers that are known to cause your primary headaches. SEEK MEDICAL CARE IF: You continue to experience headaches after following your health care provider's recommended treatments. SEEK IMMEDIATE MEDICAL CARE IF:  You develop new headache pain.  You develop headache pain that is different than what you have experienced in the past.  You develop numbness or tingling in your arms or legs.  You develop changes in your speech or vision. MAKE SURE YOU:  Understand these instructions.  Will watch your child's condition.  Will get help right away if your child is not doing well or gets worse.   This information is not intended to replace advice given to you by your health care provider. Make sure you discuss any questions you have with your health care provider.   Document Released: 06/01/2003 Document Revised: 04/01/2014 Document Reviewed: 09/24/2012 Elsevier Interactive Patient Education 2016 Elsevier Inc.  General Headache Without Cause A headache is pain or discomfort felt around the head or neck area. The specific cause of a headache may not be found. There are many causes and types of headaches. A few common ones are:  Tension headaches.  Migraine headaches.  Cluster headaches.  Chronic daily headaches. HOME CARE INSTRUCTIONS  Watch your condition for any changes. Take these steps to help with your condition: Managing Pain  Take over-the-counter and prescription medicines only as told by your health care provider.  Lie down in a dark, quiet room when you have a headache.  If directed, apply ice to the head and neck area:  Put ice in a plastic bag.  Place a towel between your skin and the bag.  Leave the ice on  for 20 minutes, 2-3 times per day.  Use a heating pad or hot shower to apply heat to the head and neck area as told by your health care provider.  Keep lights dim if bright lights bother you or make your headaches worse. Eating and Drinking  Eat meals on a regular schedule.  Limit alcohol use.  Decrease the amount of caffeine you drink, or stop drinking caffeine. General Instructions  Keep all follow-up visits as told by your health care provider. This is important.  Keep a headache journal to help find out what may trigger your headaches. For example, write down:  What you eat and drink.  How much sleep you get.  Any change to your diet or medicines.  Try massage or other relaxation techniques.  Limit stress.  Sit up straight, and do not tense your muscles.  Do not use tobacco products, including cigarettes, chewing tobacco, or e-cigarettes. If you need help quitting, ask your health care provider.  Exercise regularly as told by your health care provider.  Sleep on a regular schedule. Get 7-9 hours of sleep, or the amount recommended by your health care provider. SEEK MEDICAL CARE IF:   Your symptoms are not helped by medicine.  You have a headache that is different from the usual headache.  You have nausea or you vomit.  You have a fever. SEEK IMMEDIATE MEDICAL CARE IF:   Your headache becomes severe.  You have repeated vomiting.  You have a  stiff neck.  You have a loss of vision.  You have problems with speech.  You have pain in the eye or ear.  You have muscular weakness or loss of muscle control.  You lose your balance or have trouble walking.  You feel faint or pass out.  You have confusion.   This information is not intended to replace advice given to you by your health care provider. Make sure you discuss any questions you have with your health care provider.   Document Released: 03/11/2005 Document Revised: 11/30/2014 Document Reviewed:  07/04/2014 Elsevier Interactive Patient Education 2016 Elsevier Inc. Acetaminophen; Butalbital; Caffeine tablets or capsules What is this medicine? ACETAMINOPHEN; BUTALBITAL; CAFFEINE (a set a MEE noe fen; byoo TAL bi tal; KAF een) is a pain reliever. It is used to treat tension headaches. This medicine may be used for other purposes; ask your health care provider or pharmacist if you have questions. What should I tell my health care provider before I take this medicine? They need to know if you have any of these conditions: -drug abuse or addiction -heart or circulation problems -if you often drink alcohol -kidney disease or problems going to the bathroom -liver disease -lung disease, asthma, or breathing problems -porphyria -an unusual or allergic reaction to acetaminophen, butalbital or other barbiturates, caffeine, other medicines, foods, dyes, or preservatives -pregnant or trying to get pregnant -breast-feeding How should I use this medicine? Take this medicine by mouth with a full glass of water. Follow the directions on the prescription label. If the medicine upsets your stomach, take the medicine with food or milk. Do not take more than you are told to take. Talk to your pediatrician regarding the use of this medicine in children. Special care may be needed. Overdosage: If you think you have taken too much of this medicine contact a poison control center or emergency room at once. NOTE: This medicine is only for you. Do not share this medicine with others. What if I miss a dose? If you miss a dose, take it as soon as you can. If it is almost time for your next dose, take only that dose. Do not take double or extra doses. What may interact with this medicine? -alcohol or medicines that contain alcohol -antidepressants, especially MAOIs like isocarboxazid, phenelzine, tranylcypromine, and selegiline -antihistamines -benzodiazepines -carbamazepine -isoniazid -medicines for pain  like pentazocine, buprenorphine, butorphanol, nalbuphine, tramadol, and propoxyphene -muscle relaxants -naltrexone -phenobarbital, phenytoin, and fosphenytoin -phenothiazines like perphenazine, thioridazine, chlorpromazine, mesoridazine, fluphenazine, prochlorperazine, promazine, and trifluoperazine -voriconazole This list may not describe all possible interactions. Give your health care provider a list of all the medicines, herbs, non-prescription drugs, or dietary supplements you use. Also tell them if you smoke, drink alcohol, or use illegal drugs. Some items may interact with your medicine. What should I watch for while using this medicine? Tell your doctor or health care professional if your pain does not go away, if it gets worse, or if you have new or a different type of pain. You may develop tolerance to the medicine. Tolerance means that you will need a higher dose of the medicine for pain relief. Tolerance is normal and is expected if you take the medicine for a long time. Do not suddenly stop taking your medicine because you may develop a severe reaction. Your body becomes used to the medicine. This does NOT mean you are addicted. Addiction is a behavior related to getting and using a drug for a non-medical reason. If you have pain,  you have a medical reason to take pain medicine. Your doctor will tell you how much medicine to take. If your doctor wants you to stop the medicine, the dose will be slowly lowered over time to avoid any side effects. You may get drowsy or dizzy when you first start taking the medicine or change doses. Do not drive, use machinery, or do anything that may be dangerous until you know how the medicine affects you. Stand or sit up slowly. Do not take other medicines that contain acetaminophen with this medicine. Always read labels carefully. If you have questions, ask your doctor or pharmacist. If you take too much acetaminophen get medical help right away. Too much  acetaminophen can be very dangerous and cause liver damage. Even if you do not have symptoms, it is important to get help right away. What side effects may I notice from receiving this medicine? Side effects that you should report to your doctor or health care professional as soon as possible: -allergic reactions like skin rash, itching or hives, swelling of the face, lips, or tongue -breathing problems -confusion -feeling faint or lightheaded, falls -redness, blistering, peeling or loosening of the skin, including inside the mouth -seizure -stomach pain -yellowing of the eyes or skin Side effects that usually do not require medical attention (report to your doctor or health care professional if they continue or are bothersome): -constipation -nausea, vomiting This list may not describe all possible side effects. Call your doctor for medical advice about side effects. You may report side effects to FDA at 1-800-FDA-1088. Where should I keep my medicine? Keep out of the reach of children. This medicine can be abused. Keep your medicine in a safe place to protect it from theft. Do not share this medicine with anyone. Selling or giving away this medicine is dangerous and against the law. This medicine may cause accidental overdose and death if it taken by other adults, children, or pets. Mix any unused medicine with a substance like cat litter or coffee grounds. Then throw the medicine away in a sealed container like a sealed bag or a coffee can with a lid. Do not use the medicine after the expiration date. Store at room temperature between 15 and 30 degrees C (59 and 86 degrees F). NOTE: This sheet is a summary. It may not cover all possible information. If you have questions about this medicine, talk to your doctor, pharmacist, or health care provider.    2016, Elsevier/Gold Standard. (2013-05-07 15:00:25)

## 2015-05-29 ENCOUNTER — Ambulatory Visit: Payer: Self-pay | Admitting: Family Medicine

## 2015-05-30 ENCOUNTER — Encounter: Payer: Self-pay | Admitting: Obstetrics and Gynecology

## 2015-05-30 ENCOUNTER — Ambulatory Visit (INDEPENDENT_AMBULATORY_CARE_PROVIDER_SITE_OTHER): Payer: Medicaid Other | Admitting: Obstetrics and Gynecology

## 2015-05-30 VITALS — BP 143/92 | HR 76 | Temp 97.5°F | Ht 75.0 in | Wt 237.0 lb

## 2015-05-30 DIAGNOSIS — W19XXXA Unspecified fall, initial encounter: Secondary | ICD-10-CM

## 2015-05-30 DIAGNOSIS — M545 Low back pain, unspecified: Secondary | ICD-10-CM

## 2015-05-30 MED ORDER — PANTOPRAZOLE SODIUM 40 MG PO TBEC: 40.0000 mg | DELAYED_RELEASE_TABLET | Freq: Two times a day (BID) | ORAL | Status: DC

## 2015-05-30 MED ORDER — HYDROCODONE-ACETAMINOPHEN 5-325 MG PO TABS: 1.0000 | ORAL_TABLET | Freq: Four times a day (QID) | ORAL | Status: DC | PRN

## 2015-05-30 NOTE — Progress Notes (Signed)
   Subjective:   Patient ID: Cathy FlackSharon L Simon, female    DOB: 05/04/1965, 50 y.o.   MRN: [id number]  Patient presents for Same Day Appointment  Chief Complaint  Patient presents with  . Tailbone Pain    fell last night    HPI: #BACK PAIN Fell and hit butt bone - yesterday States that her leg gave out; patient has chronic right knee pain from osteoarthritis Has fallen before Trying to get to bathroom - hit floor in bathroom Usually walks with cane Pain felt like a child upper back Pain worse in her mid back and left side Pain is described as constant dull pain. She denies any nerve pain. She does not believe that she broke anything. Feels like the left side is more swollen Requesting pain medication Patient has tried nothing for pain Pain does not radiate  Symptoms Incontinence of bowel or bladder:  No Numbness of leg: No Fever: No Rest or Night pain:  Rash: No  ROS see HPI Smoking Status noted.  Past medical history, surgical, family, and social history reviewed and updated in the EMR as appropriate.  Objective:  BP 143/92 mmHg  Pulse 76  Temp(Src) 97.5 F (36.4 C) (Oral)  Ht 6\' 3"  (1.905 m)  Wt 237 lb (107.502 kg)  BMI 29.62 kg/m2 Vitals and nursing note reviewed  Physical Exam  Constitutional: She is well-developed, well-nourished, and in no distress.  Musculoskeletal:       Lumbar back: She exhibits decreased range of motion, tenderness and pain. She exhibits no swelling and no deformity.  No bruising or erythema noted on back.  Neurological: She is alert. She has normal sensation and intact cranial nerves. She displays weakness. Gait abnormal.  Right leg strength 4/5 compared to the left leg. Walks with cane    Assessment & Plan:  1. Acute left-sided low back pain without sciatica Acute worsening of back pain secondary to recent fall. Patient without signs of bony injury. No need for imaging at this time. No other red flags on exam. She does have  decreased range of motion and  tenderness to palpation. Prescription given for limited amount of Norco. Encouraged patient to keep moving to help with back pain. Handout given on back exercises.  2. Fall, initial encounter Hand out given on fall prevention.   Caryl AdaJazma Phelps, DO 05/30/2015, 10:14 AM PGY-2, [facility]

## 2015-05-30 NOTE — Patient Instructions (Addendum)
Back Injury Prevention  Back injuries can be very painful. They can also be difficult to heal. After having one back injury, you are more likely to injure your back again. It is important to learn how to avoid injuring or re-injuring your back. The following tips can help you to prevent a back injury.  WHAT SHOULD I KNOW ABOUT PHYSICAL FITNESS?  · Exercise for 30 minutes per day on most days of the week or as told by your doctor. Make sure to:  ¨ Do aerobic exercises, such as walking, jogging, biking, or swimming.  ¨ Do exercises that increase balance and strength, such as tai chi and yoga.  ¨ Do stretching exercises. This helps with flexibility.  ¨ Try to develop strong belly (abdominal) muscles. Your belly muscles help to support your back.  · Stay at a healthy weight. This helps to decrease your risk of a back injury.  WHAT SHOULD I KNOW ABOUT MY DIET?  · Talk with your doctor about your overall diet. Take supplements and vitamins only as told by your doctor.  · Talk with your doctor about how much calcium and vitamin D you need each day. These nutrients help to prevent weakening of the bones (osteoporosis).  · Include good sources of calcium in your diet, such as:    Dairy products.    Green leafy vegetables.    Products that have had calcium added to them (fortified).  · Include good sources of vitamin D in your diet, such as:    Milk.    Foods that have had vitamin D added to them.  WHAT SHOULD I KNOW ABOUT MY POSTURE?  · Sit up straight and stand up straight. Avoid leaning forward when you sit or hunching over when you stand.  · Choose chairs that have good low-back (lumbar) support.  · If you work at a desk, sit close to it so you do not need to lean over. Keep your chin tucked in. Keep your neck drawn back. Keep your elbows bent so your arms look like the letter "L" (right angle).  · Sit high and close to the steering wheel when you drive. Add a low-back support to your car seat, if needed.  · Avoid sitting  or standing in one position for very long. Take breaks to get up, stretch, and walk around at least one time every hour. Take breaks every hour if you are driving for long periods of time.  · Sleep on your side with your knees slightly bent, or sleep on your back with a pillow under your knees. Do not lie on the front of your body to sleep.  WHAT SHOULD I KNOW ABOUT LIFTING, TWISTING, AND REACHING  Lifting and Heavy Lifting   · Avoid heavy lifting, especially lifting over and over again. If you must do heavy lifting:    Stretch before lifting.    Work slowly.    Rest between lifts.    Use a tool such as a cart or a dolly to move objects if one is available.    Make several small trips instead of carrying one heavy load.    Ask for help when you need it, especially when moving big objects.  · Follow these steps when lifting:    Stand with your feet shoulder-width apart.    Get as close to the object as you can. Do not pick up a heavy object that is far from your body.    Use handles or lifting   as close to the center of your body as possible.  Follow these steps when putting down a heavy load:  Stand with your feet shoulder-width apart.  Lower the object slowly while you tighten the muscles in your legs, belly, and butt. Keep the object as close to the center of your body as possible.  Keep your shoulders back. Keep your chin tucked in. Keep your back straight.  Bend at your knees. Squat down, but keep your heels off the floor.  Use handles or lifting straps if they are available. Twisting and Reaching  Avoid lifting heavy objects above your waist.  Do not twist at your waist while you are lifting or carrying a load. If  you need to turn, move your feet.  Do not bend over without bending at your knees.  Avoid reaching over your head, across a table, or for an object on a high surface.  WHAT ARE SOME OTHER TIPS?  Avoid wet floors and icy ground. Keep sidewalks clear of ice to prevent falls.   Do not sleep on a mattress that is too soft or too hard.   Keep items that you use often within easy reach.   Put heavier objects on shelves at waist level, and put lighter objects on lower or higher shelves.  Find ways to lower your stress, such as:  Exercise.  Massage.  Relaxation techniques.  Talk with your doctor if you feel anxious or depressed. These conditions can make back pain worse.  Wear flat heel shoes with cushioned soles.  Avoid making quick (sudden) movements.  Use both shoulder straps when carrying a backpack.  Do not use any tobacco products, including cigarettes, chewing tobacco, or electronic cigarettes. If you need help quitting, ask your doctor.   This information is not intended to replace advice given to you by your health care provider. Make sure you discuss any questions you have with your health care provider.   Document Released: 08/28/2007 Document Revised: 07/26/2014 Document Reviewed: 03/15/2014 Elsevier Interactive Patient Education 2016 [place] in the Home  Falls can cause injuries. They can happen to people of all ages. There are many things you can do to make your home safe and to help prevent falls.  WHAT CAN I DO ON THE OUTSIDE OF MY HOME?  Regularly fix the edges of walkways and driveways and fix any cracks.  Remove anything that might make you trip as you walk through a door, such as a raised step or threshold.  Trim any bushes or trees on the path to your home.  Use bright outdoor lighting.  Clear any walking paths of anything that might make someone trip, such as rocks or tools.  Regularly check to see if handrails are  loose or broken. Make sure that both sides of any steps have handrails.  Any raised decks and porches should have guardrails on the edges.  Have any leaves, snow, or ice cleared regularly.  Use sand or salt on walking paths during winter.  Clean up any spills in your garage right away. This includes oil or grease spills. WHAT CAN I DO IN THE BATHROOM?   Use night lights.  Install grab bars by the toilet and in the tub and shower. Do not use towel bars as grab bars.  Use non-skid mats or decals in the tub or shower.  If you need to sit down in the shower, use a plastic, non-slip stool.  Keep the floor dry.  Clean up any water that spills on the floor as soon as it happens.  Remove soap buildup in the tub or shower regularly.  Attach bath mats securely with double-sided non-slip rug tape.  Do not have throw rugs and other things on the floor that can make you trip. WHAT CAN I DO IN THE BEDROOM?  Use night lights.  Make sure that you have a light by your bed that is easy to reach.  Do not use any sheets or blankets that are too big for your bed. They should not hang down onto the floor.  Have a firm chair that has side arms. You can use this for support while you get dressed.  Do not have throw rugs and other things on the floor that can make you trip. WHAT CAN I DO IN THE KITCHEN?  Clean up any spills right away.  Avoid walking on wet floors.  Keep items that you use a lot in easy-to-reach places.  If you need to reach something above you, use a strong step stool that has a grab bar.  Keep electrical cords out of the way.  Do not use floor polish or wax that makes floors slippery. If you must use wax, use non-skid floor wax.  Do not have throw rugs and other things on the floor that can make you trip. WHAT CAN I DO WITH MY STAIRS?  Do not leave any items on the stairs.  Make sure that there are handrails on both sides of the stairs and use them. Fix handrails that  are broken or loose. Make sure that handrails are as long as the stairways.  Check any carpeting to make sure that it is firmly attached to the stairs. Fix any carpet that is loose or worn.  Avoid having throw rugs at the top or bottom of the stairs. If you do have throw rugs, attach them to the floor with carpet tape.  Make sure that you have a light switch at the top of the stairs and the bottom of the stairs. If you do not have them, ask someone to add them for you. WHAT ELSE CAN I DO TO HELP PREVENT FALLS?  Wear shoes that:  Do not have high heels.  Have rubber bottoms.  Are comfortable and fit you well.  Are closed at the toe. Do not wear sandals.  If you use a stepladder:  Make sure that it is fully opened. Do not climb a closed stepladder.  Make sure that both sides of the stepladder are locked into place.  Ask someone to hold it for you, if possible.  Clearly mark and make sure that you can see:  Any grab bars or handrails.  First and last steps.  Where the edge of each step is.  Use tools that help you move around (mobility aids) if they are needed. These include:  Canes.  Walkers.  Scooters.  Crutches.  Turn on the lights when you go into a dark area. Replace any light bulbs as soon as they burn out.  Set up your furniture so you have a clear path. Avoid moving your furniture around.  If any of your floors are uneven, fix them.  If there are any pets around you, be aware of where they are.  Review your medicines with your doctor. Some medicines can make you feel dizzy. This can increase your chance of falling. Ask your doctor what other things that you can do to help prevent falls.  This information is not intended to replace advice given to you by your health care provider. Make sure you discuss any questions you have with your health care provider.   Document Released: 01/05/2009 Document Revised: 07/26/2014 Document Reviewed:  04/15/2014 Elsevier Interactive Patient Education 2016 [name] [address], [place], [place] [address] Phone: [phone]

## 2015-06-12 ENCOUNTER — Telehealth: Payer: Self-pay | Admitting: Family Medicine

## 2015-06-12 DIAGNOSIS — M1711 Unilateral primary osteoarthritis, right knee: Secondary | ICD-10-CM

## 2015-06-12 NOTE — Telephone Encounter (Signed)
[name] [place] in [place] last Thursday. She is out of pain medication and need refill on this.  Will pick up when ready.  Also she need an order sent to [facility] for an elevated toilet seat.

## 2015-06-12 NOTE — Telephone Encounter (Signed)
Pt calls again.  She had a procedure on her knee Thursday (she mentioned having a gel put in her knee) and is in pain.  Appt made for tomorrow, she will cancel if she hears from Dr. Red ChristiansGrunz Chitara Clonch, Maryjo RochesterJessica Dawn, CMA

## 2015-06-13 ENCOUNTER — Encounter (HOSPITAL_COMMUNITY): Payer: Self-pay | Admitting: Emergency Medicine

## 2015-06-13 ENCOUNTER — Emergency Department (HOSPITAL_COMMUNITY): Payer: Medicaid Other

## 2015-06-13 ENCOUNTER — Encounter: Payer: Self-pay | Admitting: Family Medicine

## 2015-06-13 ENCOUNTER — Ambulatory Visit (INDEPENDENT_AMBULATORY_CARE_PROVIDER_SITE_OTHER): Payer: Medicaid Other | Admitting: Family Medicine

## 2015-06-13 ENCOUNTER — Emergency Department (HOSPITAL_COMMUNITY)
Admission: EM | Admit: 2015-06-13 | Discharge: 2015-06-13 | Disposition: A | Discharge status: Home / Self Care | Payer: Medicaid Other | Attending: Emergency Medicine | Admitting: Emergency Medicine

## 2015-06-13 VITALS — BP 141/80 | HR 79 | Temp 97.8°F | Wt 238.0 lb

## 2015-06-13 DIAGNOSIS — G8929 Other chronic pain: Secondary | ICD-10-CM | POA: Insufficient documentation

## 2015-06-13 DIAGNOSIS — K219 Gastro-esophageal reflux disease without esophagitis: Secondary | ICD-10-CM | POA: Insufficient documentation

## 2015-06-13 DIAGNOSIS — M1990 Unspecified osteoarthritis, unspecified site: Secondary | ICD-10-CM | POA: Diagnosis not present

## 2015-06-13 DIAGNOSIS — M1711 Unilateral primary osteoarthritis, right knee: Secondary | ICD-10-CM

## 2015-06-13 DIAGNOSIS — Z872 Personal history of diseases of the skin and subcutaneous tissue: Secondary | ICD-10-CM | POA: Diagnosis not present

## 2015-06-13 DIAGNOSIS — F410 Panic disorder [episodic paroxysmal anxiety] without agoraphobia: Secondary | ICD-10-CM | POA: Insufficient documentation

## 2015-06-13 DIAGNOSIS — Z79899 Other long term (current) drug therapy: Secondary | ICD-10-CM | POA: Diagnosis not present

## 2015-06-13 DIAGNOSIS — M545 Low back pain: Secondary | ICD-10-CM | POA: Diagnosis not present

## 2015-06-13 DIAGNOSIS — M25561 Pain in right knee: Secondary | ICD-10-CM | POA: Insufficient documentation

## 2015-06-13 DIAGNOSIS — F319 Bipolar disorder, unspecified: Secondary | ICD-10-CM | POA: Insufficient documentation

## 2015-06-13 DIAGNOSIS — Z8739 Personal history of other diseases of the musculoskeletal system and connective tissue: Secondary | ICD-10-CM

## 2015-06-13 DIAGNOSIS — F17210 Nicotine dependence, cigarettes, uncomplicated: Secondary | ICD-10-CM | POA: Diagnosis not present

## 2015-06-13 DIAGNOSIS — Z8679 Personal history of other diseases of the circulatory system: Secondary | ICD-10-CM | POA: Diagnosis not present

## 2015-06-13 DIAGNOSIS — Z8742 Personal history of other diseases of the female genital tract: Secondary | ICD-10-CM | POA: Diagnosis not present

## 2015-06-13 MED ORDER — OXYCODONE-ACETAMINOPHEN 5-325 MG PO TABS
ORAL_TABLET | ORAL | Status: AC
  Filled 2015-06-13: qty 1

## 2015-06-13 MED ORDER — OXYCODONE-ACETAMINOPHEN 5-325 MG PO TABS: 1.0000 | ORAL_TABLET | ORAL | Status: DC | PRN

## 2015-06-13 MED ORDER — KETOROLAC TROMETHAMINE 30 MG/ML IM SOLN: 30.0000 mg | Freq: Once | INTRAMUSCULAR | Status: DC

## 2015-06-13 MED ORDER — OXYCODONE-ACETAMINOPHEN 5-325 MG PO TABS
1.0000 | ORAL_TABLET | ORAL | Status: DC | PRN
  Administered 2015-06-13: 1 via ORAL

## 2015-06-13 MED ORDER — KETOROLAC TROMETHAMINE 30 MG/ML IJ SOLN: 30.0000 mg | Freq: Once | INTRAMUSCULAR | Status: DC

## 2015-06-13 NOTE — ED Provider Notes (Signed)
CSN: [id number]     Arrival date & time 06/13/15  0518 History   First MD Initiated Contact with Patient 06/13/15 [phone]     Chief Complaint  Patient presents with  . Knee Pain     (Consider location/radiation/quality/duration/timing/severity/associated sxs/prior Treatment) HPI  This is a 50 year old female who presents with right knee pain. History of chronic right knee pain and osteoarthritis. Patient reports that she was seen by her orthopedist last week and had "collagen injected in my knee." She states that since that time she has had worsening pain. She was given Vicodin but reports minimal relief. She's been using heat which seems to help initially. She states "I can't use ice because I have arthritis." She denies any recent fevers or skin changes. She is able to ambulate but with pain. Patient reports that she was given a pain medicine in triage and "I feel much better." She reports the pain is aching. It is currently a 2 out of 10.  Past Medical History  Diagnosis Date  . Depression   . Anxiety   . Panic attacks   . GERD (gastroesophageal reflux disease)   . Arthritis   . VARICOSE VEINS, LOWER EXTREMITIES 06/30/2006    Qualifier: Diagnosis of  By: Seleta Rhymes MD, Loraine Leriche    . TOBACCO ABUSE 04/13/2008  . 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 OVARIAN CYST 08/12/2007    Qualifier: Diagnosis of  By: Seleta Rhymes MD, Loraine Leriche    . LOW BACK PAIN, CHRONIC 06/30/2006    Qualifier: Diagnosis of  By: Seleta Rhymes MD, Loraine Leriche    . 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 Bipolar 1 disorder [facility])    Past Surgical History  Procedure Laterality Date  . Knee cartilage surgery  2013    right    Family History  Problem Relation Age of Onset  . Hypertension Mother   . Diabetes Mother   . Diabetes Father   . Hypertension Father    Social  History  Substance Use Topics  . Smoking status: Current Every Day Smoker -- 0.00 packs/day    Types: Cigarettes  . Smokeless tobacco: Never Used  . Alcohol Use: Yes     Comment: Drinks <10 monthly   OB History    No data available     Review of Systems  Constitutional: Negative for fever.  Musculoskeletal:       Right knee pain  Skin: Negative for color change.  All other systems reviewed and are negative.     Allergies  Review of patient's allergies indicates no known allergies.  Home Medications   Prior to Admission medications   Medication Sig Start Date End Date Taking? Authorizing Provider  ALPRAZolam Prudy Feeler) 1 MG tablet Take 1 mg by mouth 4 (four) times daily as needed for anxiety. For anxiety    Historical Provider, MD  butalbital-acetaminophen-caffeine [facility]) [id number] MG tablet Take 1-2 tablets by mouth 2 (two) times daily with a meal. 05/14/15 05/13/16  Arthor Captain, PA-C  Cariprazine HCl (VRAYLAR) 1.5 MG CAPS Take 1.5 mg by mouth daily.    Historical Provider, MD  colchicine 0.6 MG tablet Take 1 tablet (0.6 mg total) by mouth daily. Take two tablets twoday 01/19/15   Narda Bonds, MD  diazepam (VALIUM) 10 MG tablet Take 10  mg by mouth every 6 (six) hours as needed. Anxiety    Historical Provider, MD  divalproex (DEPAKOTE ER) 500 MG 24 hr tablet Take 500 mg by mouth at bedtime.     Historical Provider, MD  HYDROcodone-acetaminophen (NORCO/VICODIN) 5-325 MG tablet Take 1 tablet by mouth every 6 (six) hours as needed for moderate pain. 05/30/15   Pincus LargeJazma Y Phelps, DO  oxyCODONE-acetaminophen (PERCOCET/ROXICET) 5-325 MG tablet Take 1 tablet by mouth every 4 (four) hours as needed for moderate pain (May repeat x1 in 30 minutes prn for continued moderate pain.If pt requires a second dose, advise EDP.). 06/13/15   Shon Batonourtney F Edilson Vital, MD  pantoprazole (PROTONIX) 40 MG tablet Take 1 tablet (40 mg total) by mouth 2 (two) times daily before a meal. 05/30/15   Pincus LargeJazma Y Phelps, DO    BP 158/88 mmHg  Pulse 82  Temp(Src) 98.2 F (36.8 C) (Oral)  Resp 16  SpO2 99%  LMP 05/24/2015 Physical Exam  Constitutional: She is oriented to person, place, and time. She appears well-developed and well-nourished. No distress.  HENT:  Head: Normocephalic and atraumatic.  Cardiovascular: Normal rate and regular rhythm.   Pulmonary/Chest: Effort normal. No respiratory distress.  Musculoskeletal:  Focused examination of the right knee reveals a moderate effusion mostly superiorly, no overlying skin changes, no joint line tenderness, normal range of motion, 2+ DP pulse, ligaments appear intact  Neurological: She is alert and oriented to person, place, and time.  Skin: Skin is warm and dry.  Psychiatric: She has a normal mood and affect.  Nursing note and vitals reviewed.   ED Course  Procedures (including critical care time) Labs Review Labs Reviewed - No data to display  Imaging Review Dg Knee Complete 4 Views Right  06/13/2015  CLINICAL DATA:  Right anterior knee pain and swelling, 1 week duration. EXAM: RIGHT KNEE - COMPLETE 4+ VIEW COMPARISON:  03/18/2015 FINDINGS: There is a moderately large joint effusion. There are severe osteoarthritic changes, particularly involving the medial and patellofemoral compartments. There is no bone lesion or bony destruction. There is no fracture. There is no radiopaque foreign body. IMPRESSION: Severe osteoarthritis. Moderately large joint effusion. Negative for acute fracture. Electronically Signed   By: Ellery Plunkaniel R Mitchell M.D.   On: 06/13/2015 06:06   I have personally reviewed and evaluated these images and lab results as part of my medical decision-making.   EKG Interpretation None      MDM   Final diagnoses:  Knee pain, acute, right  History of osteoarthritis    Patient presents with worsening right knee pain since injection last week. Nontoxic. Afebrile. No signs or symptoms of septic joint. Patient states that the effusion  around her knee has been there since the injection. Plain films are negative. She does report improvement of symptoms with Percocet. Discussed with patient that she will need to follow-up with her orthopedist. No emergent intervention at this time. She would likely do better with ice rather than heat as swelling will worsen with heat. She will be given a few pills at discharge. She'll need follow-up with her primary physician or orthopedist for further adjustment in her pain medications.  After history, exam, and medical workup I feel the patient has been appropriately medically screened and is safe for discharge home. Pertinent diagnoses were discussed with the patient. Patient was given return precautions.     Shon Batonourtney F Kais Monje, MD 06/13/15 [phone]

## 2015-06-13 NOTE — Discharge Instructions (Signed)
You were seen today for knee pain. This is likely related to your osteoarthritis and recent injection. You need to follow-up with her primary physician or orthopedist for further management. You'll be given a few pills of a stronger medication. Do not take with Vicodin.  Osteoarthritis Osteoarthritis is a disease that causes soreness and inflammation of a joint. It occurs when the cartilage at the affected joint wears down. Cartilage acts as a cushion, covering the ends of bones where they meet to form a joint. Osteoarthritis is the most common form of arthritis. It often occurs in older people. The joints affected most often by this condition include those in the:  Ends of the fingers.  Thumbs.  Neck.  Lower back.  Knees.  Hips. CAUSES  Over time, the cartilage that covers the ends of bones begins to wear away. This causes bone to rub on bone, producing pain and stiffness in the affected joints.  RISK FACTORS Certain factors can increase your chances of having osteoarthritis, including:  Older age.  Excessive body weight.  Overuse of joints.  Previous joint injury. SIGNS AND SYMPTOMS   Pain, swelling, and stiffness in the joint.  Over time, the joint may lose its normal shape.  Small deposits of bone (osteophytes) may grow on the edges of the joint.  Bits of bone or cartilage can break off and float inside the joint space. This may cause more pain and damage. DIAGNOSIS  Your health care provider will do a physical exam and ask about your symptoms. Various tests may be ordered, such as:  X-rays of the affected joint.  Blood tests to rule out other types of arthritis. Additional tests may be used to diagnose your condition. TREATMENT  Goals of treatment are to control pain and improve joint function. Treatment plans may include:  A prescribed exercise program that allows for rest and joint relief.  A weight control plan.  Pain relief techniques, such as:  Properly  applied heat and cold.  Electric pulses delivered to nerve endings under the skin (transcutaneous electrical nerve stimulation [TENS]).  Massage.  Certain nutritional supplements.  Medicines to control pain, such as:  Acetaminophen.  Nonsteroidal anti-inflammatory drugs (NSAIDs), such as naproxen.  Narcotic or central-acting agents, such as tramadol.  Corticosteroids. These can be given orally or as an injection.  Surgery to reposition the bones and relieve pain (osteotomy) or to remove loose pieces of bone and cartilage. Joint replacement may be needed in advanced states of osteoarthritis. HOME CARE INSTRUCTIONS   Take medicines only as directed by your health care provider.  Maintain a healthy weight. Follow your health care provider's instructions for weight control. This may include dietary instructions.  Exercise as directed. Your health care provider can recommend specific types of exercise. These may include:  Strengthening exercises. These are done to strengthen the muscles that support joints affected by arthritis. They can be performed with weights or with exercise bands to add resistance.  Aerobic activities. These are exercises, such as brisk walking or low-impact aerobics, that get your heart pumping.  Range-of-motion activities. These keep your joints limber.  Balance and agility exercises. These help you maintain daily living skills.  Rest your affected joints as directed by your health care provider.  Keep all follow-up visits as directed by your health care provider. SEEK MEDICAL CARE IF:   Your skin turns red.  You develop a rash in addition to your joint pain.  You have worsening joint pain.  You have a  fever along with joint or muscle aches. SEEK IMMEDIATE MEDICAL CARE IF:  You have a significant loss of weight or appetite.  You have night sweats. FOR MORE INFORMATION   National Institute of Arthritis and Musculoskeletal and Skin Diseases:  www.niams.http://www.myers.net/nih.gov  [employer]ging: https://walker.com/www.nia.nih.gov  American College of Rheumatology: www.rheumatology.org   This information is not intended to replace advice given to you by your health care provider. Make sure you discuss any questions you have with your health care provider.   Document Released: 03/11/2005 Document Revised: 04/01/2014 Document Reviewed: 11/16/2012 Elsevier Interactive Patient Education [employer]2016 [employer] Inc[employer]

## 2015-06-13 NOTE — Progress Notes (Signed)
Subjective:    Patient ID: [name], female    DOB: 10/27/1965, 50 y.o.   MRN: [id number]  [name] is a 50 y.o. female presenting on 06/13/2015 for Knee Pain   Patient presents for a same day appointment.    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 She called our clinic requesting pain medication, which she has received before and apt was made for today. Additionally, she went to [facility] earlier today 06/13/15 for same complaint R-knee pain, X-rays in ED confirmed known severe OA, moderate-large joint effusion without acute findings. Improved in ED with Percocet 5/325, given rx for #2 pills on discharge. - Now patient reports persistent R-knee pain, requesting refill on Percocet. She does not have a chronic pain medicine contract. She has previously tried OTC meds Tylenol, Ibuprofen without relief. History of trial on Naproxen with some relief. Additionally had prior steroid injections by ortho in past that worsened pain. She states that Ortho plans to do a R-knee replacement in next 3 months, she is waiting on a Knee Brace that was ordered. Pain improved with elevation. Ice makes her knee hurt. - Denies fevers/chills, rash, redness, fall, trauma or injury   Social History  Substance Use Topics  . Smoking status: Current Every Day Smoker -- 0.00 packs/day    Types: Cigarettes  . Smokeless tobacco: Never Used  . Alcohol Use: Yes     Comment: Drinks <10 monthly    Review of Systems Per HPI unless specifically indicated above     Objective:    BP 141/80 mmHg  Pulse 79  Temp(Src) 97.8 F (36.6 C) (Oral)  Wt 238  lb (107.956 kg)  LMP 05/24/2015  Wt Readings from Last 3 Encounters:  06/13/15 238 lb (107.956 kg)  05/30/15 237 lb (107.502 kg)  05/14/15 238 lb (107.956 kg)    Physical Exam  Constitutional: She appears well-developed and well-nourished. No distress.  Well-appearing, frustrated but cooperative, uncomfortable due to Right knee pain.  Musculoskeletal:  Bilateral Knees Inspection: Right knee significant joint effusion compared to Left. No erythema. Palpation: Moderate generalized +TTP Right knee only not localized to joint line ROM: Significantly reduced active ROM extension due to effusion and pain. Special Testing: Major ligamentous structures intact with knee stability, intact ACL. Did not test mensicus due to acute effusion and pain, unable to get accurate exam. Strength: Lower ext strength intact 5/5, knee flex/ext limited by pain. Able to stand and ambulate. Neurovascular: distally intact sensation light touch and pulses  Neurological: She is alert.  Skin: Skin is warm and dry. No rash noted. She is not diaphoretic.  Nursing note and vitals reviewed.      Assessment & Plan:   Problem List Items Addressed This Visit    Osteoarthritis of knee    Persistent acute on chronic flare R-knee OA pain. Moderate sized joint effusion, without erythema, warmth. Not consistent with septic arthritis. S/p synvisc injection 1 week ago with worsening symptoms. - Pt on chronic disability  Plan: 1. Declined patient request for narcotic rx with Percocet. She was given #2 pills from ED earlier today. Discussed that she needs to be evaluated by Ortho following the injection  first, not to mask pain with chronic opiates. Also, defer chronic pain management to PCP. 2. Given Toradol  IM x 1 dose in clinic 3. No NSAIDs today, then start rx Naproxen  BID x 2 weeks then PRN (she had some previously, but did request this refill following day, sent in) 4. Continue supportive care, Tylenol, ice PRN,  compression, elevation, avoid excess ambulation 5. Given contact info for [facility] (she has previously seen them) and wanted to follow-up given her synvisc injection 6. Follow-up with Ortho, concern may need future knee replacement sooner rather than later       Other Visit Diagnoses    Right knee pain    -  Primary       Meds ordered this encounter  Medications  . DISCONTD: ketorolac (TORADOL) 30 MG/ML injection    Sig: Inject 1 mL (30 mg total) into the muscle once.    Dispense:  1 mL    Refill:  0  . DISCONTD: ketorolac (TORADOL) 30 MG/ML injection    Sig: Inject 1 mL (30 mg total) into the muscle once.    Dispense:  1 mL    Refill:  0  . DISCONTD: ketorolac (TORADOL) 30 MG/ML injection 30 mg    Sig:       Follow up plan: Return in about 4 weeks (around 07/11/2015), or if symptoms worsen or fail to improve, for Right knee pain.  Saralyn Pilar, DO [facility], PGY-3

## 2015-06-13 NOTE — Patient Instructions (Signed)
Thank you for coming in to clinic today.  1. For your Right knee pain, since it worsened after the injection, we strongly recommend returning to the Orthopedic doctors who did the injection, as they would need to follow you for this problem, since we do not do this procedure here. - You may follow-up back with [facility] locally if needed, since you have previously seen them for this problem  [facility] ? Address: [address], [place], [place] [address] Hours: 8:30AM-5PM Phone: [phone]  Recommend continue elevation, compression, to help the swelling, ice packs as tolerated, may use heating pad if needed for pain, but as discussed it may make the swelling worse.  Given Toradol anti-inflammatory injection today. Do not take the Naproxen with this medication. But you may resume naproxen tomorrow.  Please schedule a follow-up appointment with Dr Jarvis NewcomerGrunz within 2 to 4 weeks for Right Knee pain Arthritis  If you have any other questions or concerns, please feel free to call the clinic to contact me. You may also schedule an earlier appointment if necessary.  However, if your symptoms get significantly worse, please go to the Emergency Department to seek immediate medical attention.  Saralyn PilarAlexander Karamalegos, DO [facility]

## 2015-06-13 NOTE — ED Notes (Signed)
Pt. reports persistent right knee pain with swelling onset last week , denies injury , no fever or chills.

## 2015-06-14 MED ORDER — NAPROXEN 500 MG PO TABS: 500.0000 mg | ORAL_TABLET | Freq: Two times a day (BID) | ORAL | Status: DC

## 2015-06-14 NOTE — Telephone Encounter (Signed)
I'm a little confused by the rx for Toradol. I attempted to order Toradol 30mg  IM x 1 as an "in clinic order", she received this injection in office, as described below. She previously had a naproxen rx, but I can certainly refill this. Already ordered it Naproxen 500mg  twice daily with meals for 2 weeks then as needed, #60, with 0 refills, sent to her pharmacy.

## 2015-06-14 NOTE — Assessment & Plan Note (Addendum)
Persistent acute on chronic flare R-knee OA pain. Moderate sized joint effusion, without erythema, warmth. Not consistent with septic arthritis. S/p synvisc injection 1 week ago with worsening symptoms. - Pt on chronic disability  Plan: 1. Declined patient request for narcotic rx with Percocet. She was given #2 pills from ED earlier today. Discussed that she needs to be evaluated by Ortho following the injection first, not to mask pain with chronic opiates. Also, defer chronic pain management to PCP. 2. Given Toradol 30mg  IM x 1 dose in clinic 3. No NSAIDs today, then start rx Naproxen 500mg  BID x 2 weeks then PRN (she had some previously, but did request this refill following day, sent in) 4. Continue supportive care, Tylenol, ice PRN, compression, elevation, avoid excess ambulation 5. Given [facility] info [facility] (she has previously seen them) and wanted to follow-up given her synvisc injection 6. Follow-up with Ortho, concern may need future knee replacement sooner rather than later

## 2015-06-14 NOTE — Telephone Encounter (Signed)
Pt received medicine yesterday for knee pain.  It is adminstrated in a liquid form that she shoots it in with a needle.  She doesn't want to do it this way. She wants the pill form.  [facility] [address] is the [facility] she is using

## 2015-06-14 NOTE — Telephone Encounter (Signed)
Received a call from [facility] stating that they received a Rx for Toradol injection.  Informed them that Toradol was administered in office. Per provider's office note patient should not take Naproxen yesterday but to continue today.  Patient is waiting for a prescription for Naproxen.  Please advise.  Will forward to provider that saw patient last.  Clovis PuMartin, Tamika L, RN

## 2015-06-15 ENCOUNTER — Ambulatory Visit: Payer: Self-pay | Admitting: Family Medicine

## 2015-06-16 ENCOUNTER — Ambulatory Visit: Payer: Self-pay | Admitting: Family Medicine

## 2015-06-19 ENCOUNTER — Encounter: Payer: Self-pay | Admitting: Family Medicine

## 2015-06-19 ENCOUNTER — Ambulatory Visit (INDEPENDENT_AMBULATORY_CARE_PROVIDER_SITE_OTHER): Payer: Medicaid Other | Admitting: Family Medicine

## 2015-06-19 VITALS — BP 156/82 | HR 66 | Temp 98.1°F | Wt 236.0 lb

## 2015-06-19 DIAGNOSIS — L732 Hidradenitis suppurativa: Secondary | ICD-10-CM | POA: Diagnosis not present

## 2015-06-19 DIAGNOSIS — M545 Low back pain: Secondary | ICD-10-CM | POA: Diagnosis present

## 2015-06-19 DIAGNOSIS — B373 Candidiasis of vulva and vagina: Secondary | ICD-10-CM | POA: Diagnosis not present

## 2015-06-19 DIAGNOSIS — B3731 Acute candidiasis of vulva and vagina: Secondary | ICD-10-CM

## 2015-06-19 MED ORDER — FLUCONAZOLE 150 MG PO TABS: ORAL_TABLET | ORAL | Status: DC

## 2015-06-19 MED ORDER — DOXYCYCLINE HYCLATE 100 MG PO CAPS: 100.0000 mg | ORAL_CAPSULE | Freq: Two times a day (BID) | ORAL | Status: DC

## 2015-06-19 NOTE — Progress Notes (Signed)
Subjective:    Patient ID: Cathy FlackSharon L Lemelin, female    DOB: 11/25/1965, 50 y.o.   MRN: [id number]  Cathy FlackSharon L Chimenti is a 50 y.o. female presenting on 06/19/2015 for Vaginitis  Patient presents for a same day appointment.   HPI  VAGINAL DISCHARGE, YEAST VAGINITIS: - Reports symptoms consistent with prior vaginal yeast infection, with thicker white vaginal discharge started about 1 week ago, similar onset to starting Naproxen for R-knee pain, thinks that this medication triggered her discharge. No recent antibiotics, last course 01/2015. Denies onset related to sexual activity. No other significant changes. - Did not try OTC meds - Denies abdominal pain, vaginal odor, rash or burning, dysuria, nausea, vomiting  EARLY BOIL Right Thigh/Groin / SUPPURATIVA HIDRADENITIS: - Reports chronic history for years with recurrent boils in groin and arm pits, has been treated with courses of antibiotics, occasionally has a boil drained with I&D. Similar occurrence in this same location 01/2015 with small abscess treated with I&D and doxycycline with resolution. Today she states that she has an "early boil" in this same region, has not developed into a pustule. - Would like course of antibiotics, incase it worsens prior to upcoming beach trip - Admits some pain and localized swelling over this region - Denies spreading redness or rash, drainage, fevers/chills  Social History  Substance Use Topics  . Smoking status: Current Every Day Smoker -- 0.00 packs/day    Types: Cigarettes  . Smokeless tobacco: Never Used  . Alcohol Use: Yes     Comment: Drinks <10 monthly    Review of Systems Per HPI unless specifically indicated above     Objective:    BP 156/82 mmHg  Pulse 66  Temp(Src) 98.1 F (36.7 C) (Oral)  Wt 236 lb (107.049 kg)  LMP 05/24/2015  Wt Readings from Last 3 Encounters:  06/19/15 236 lb (107.049 kg)  06/13/15 238 lb (107.956 kg)  05/30/15 237 lb (107.502 kg)    Physical Exam    Constitutional: She appears well-developed and well-nourished. No distress.  Cardiovascular: Normal rate, regular rhythm, normal heart sounds and intact distal pulses.   No murmur heard. Pulmonary/Chest: Effort normal.  Genitourinary:  Declined pelvic exam today, including self collect for vaginal discharge.  Musculoskeletal: She exhibits no edema.  Left knee in rigid knee brace  Neurological: She is alert.  Skin: Skin is warm and dry. No rash noted. She is not diaphoretic.  Right upper thigh / groin with reported early developing boil, however patient declined to have this area examined due to her knee brace and knee pain.  Nursing note and vitals reviewed.      Assessment & Plan:   Problem List Items Addressed This Visit    Hidradenitis suppurativa - Primary    Acute early abscess formation suspected with chronic recurrent boils consistent by history with suppurativa hidradenitis in groin/axilla. - Last I&D 01/2015 with antibiotics - No systemic symptoms currently. Not suggestive of cellulitis or focal abscess, however limited by patient's cooperation with exam  Plan: 1. Given Doxycycline 100mg  BID x 7 days, may start if continues to worsen with inc pain, redness, drainage and concern for developing boil 2. Advised warm water soaks and compresses 3. Return criteria, follow-up 1 week if not improved, may need I&D      Relevant Medications   doxycycline (VIBRAMYCIN) 100 MG capsule   fluconazole (DIFLUCAN) 150 MG tablet    Other Visit Diagnoses    Yeast vaginitis  Refill given incase recurrent yeast infection after antibiotic course    Relevant Medications    fluconazole (DIFLUCAN) 150 MG tablet       Meds ordered this encounter  Medications  . doxycycline (VIBRAMYCIN) 100 MG capsule    Sig: Take 1 capsule (100 mg total) by mouth 2 (two) times daily.    Dispense:  14 capsule    Refill:  0  . fluconazole (DIFLUCAN) 150 MG tablet    Sig: Take one tablet by mouth  on Day 1. Repeat dose 2nd tablet on Day 3. Yeast may return after antibiotic.    Dispense:  2 tablet    Refill:  1      Follow up plan: Return in about 2 weeks (around 07/03/2015), or if symptoms worsen or fail to improve, for hidradenitis, vaginal discharge.  Saralyn Pilar, DO [facility], PGY-3

## 2015-06-19 NOTE — Patient Instructions (Signed)
Thank you for coming in to clinic today.  1. For your early skin infection, start Doxycycline 100mg  twice daily for 7 days, you may try warm water soaks or moist heat to help it heal.  2. For yeast infection - start Diflucan 150mg , day 1 then repeat dose on Day 3, given refill. If you take this now, you may get another yeast infection in 1 week after you finish the above antibiotic  Please schedule a follow-up appointment with Dr Jarvis NewcomerGrunz within 2 to 4 weeks to follow-up Skin Suppurativa Hidraadenitis  If you have any other questions or concerns, please feel free to call the clinic to contact me. You may also schedule an earlier appointment if necessary.  However, if your symptoms get significantly worse, please go to the Emergency Department to seek immediate medical attention.  Saralyn PilarAlexander Karamalegos, DO [facility]

## 2015-06-19 NOTE — Assessment & Plan Note (Signed)
Acute early abscess formation suspected with chronic recurrent boils consistent by history with suppurativa hidradenitis in groin/axilla. - Last I&D 01/2015 with antibiotics - No systemic symptoms currently. Not suggestive of cellulitis or focal abscess, however limited by patient's cooperation with exam  Plan: 1. Given Doxycycline 100mg  BID x 7 days, may start if continues to worsen with inc pain, redness, drainage and concern for developing boil 2. Advised warm water soaks and compresses 3. Return criteria, follow-up 1 week if not improved, may need I&D

## 2015-07-10 ENCOUNTER — Other Ambulatory Visit: Payer: Self-pay | Admitting: Family Medicine

## 2015-07-13 ENCOUNTER — Telehealth: Payer: Self-pay | Admitting: Family Medicine

## 2015-07-31 ENCOUNTER — Telehealth: Payer: Self-pay | Admitting: Family Medicine

## 2015-07-31 NOTE — Telephone Encounter (Signed)
Pt brought in form to be completed for disability placard.  She requests it to be done ASAP since she almost got a ticket today because hers had expired.  She will call tomorrow to see if it has been done

## 2015-07-31 NOTE — Telephone Encounter (Signed)
Form placed in PCP box for completion. Zimmerman Rumple, April D, CMA  

## 2015-07-31 NOTE — Telephone Encounter (Signed)
Form completed and returned to Tamika's box. The patient had already circled a 6 month period for placard. I believe her qualifying disability is chronic and could satisfy requirements for longer term, but I signed it as it is. Next time she should leave everything below "physician section" blank.

## 2015-08-01 NOTE — Telephone Encounter (Signed)
Patent informed that placard was complete and ready for pick up.  Clovis PuMartin, Diamonds Lippard L, RN

## 2015-08-09 ENCOUNTER — Other Ambulatory Visit: Payer: Self-pay | Admitting: *Deleted

## 2015-08-22 ENCOUNTER — Other Ambulatory Visit: Payer: Self-pay | Admitting: *Deleted

## 2015-08-22 NOTE — Telephone Encounter (Signed)
I am aware of her history, and I feel she very likely will require antibiotics, but she should still schedule an appointment in the next day or two to have the area evaluated. She may need an incision and drainage.

## 2015-08-22 NOTE — Telephone Encounter (Signed)
Pt states that she has had boils since she was 14 and is "always given abx for this"  She wants to know if Md will refill her abx since she is having a flare up of her boils . Nashley Cordoba, Maryjo RochesterJessica Dawn, CMA

## 2015-08-24 ENCOUNTER — Other Ambulatory Visit: Payer: Self-pay | Admitting: Obstetrics and Gynecology

## 2015-08-24 NOTE — Telephone Encounter (Signed)
Pt has an appt for Friday with Dr. Jordan LikesSchmitz. Pt states that she will not have the I and D. Sunday SpillersSharon T Leoda Smithhart, CMA

## 2015-08-25 ENCOUNTER — Telehealth: Payer: Self-pay

## 2015-08-25 ENCOUNTER — Ambulatory Visit: Payer: Self-pay | Admitting: Internal Medicine

## 2015-08-25 ENCOUNTER — Encounter: Payer: Self-pay | Admitting: Internal Medicine

## 2015-08-25 ENCOUNTER — Ambulatory Visit (INDEPENDENT_AMBULATORY_CARE_PROVIDER_SITE_OTHER): Payer: Medicaid Other | Admitting: Internal Medicine

## 2015-08-25 VITALS — BP 130/86 | HR 72 | Temp 98.6°F | Wt 240.2 lb

## 2015-08-25 DIAGNOSIS — L732 Hidradenitis suppurativa: Secondary | ICD-10-CM

## 2015-08-25 DIAGNOSIS — M545 Low back pain: Secondary | ICD-10-CM | POA: Diagnosis present

## 2015-08-25 MED ORDER — DOXYCYCLINE HYCLATE 100 MG PO CAPS: 100.0000 mg | ORAL_CAPSULE | Freq: Two times a day (BID) | ORAL | Status: DC

## 2015-08-25 NOTE — Telephone Encounter (Signed)
Called pt to see if she could come in at 11:00 am instead of 11:30 today.  If she can't that is fine. Sunday SpillersSharon T Saunders, CMA

## 2015-08-25 NOTE — Patient Instructions (Addendum)
It was nice meeting you today [name].   Please take doxycycline 100mg  (one tablet) two times a day for the next 7 days. You can also use warm compresses to help the boils drain.   If your symptoms are not improving, or if the areas get larger, please call to schedule another appointment.   Be well,  Dr. Natale MilchLancaster

## 2015-08-25 NOTE — Assessment & Plan Note (Signed)
Two new boils present in L inguinal region x3 days. Patient refusing I&D and requesting only antibiotics. No drainage and only mild fluctuance on physical exam. No systemic signs of infection.  - Doxycycline 100mg  BID x7d - Return if no improvement after course of antibiotics

## 2015-08-25 NOTE — Progress Notes (Signed)
   Subjective:    Patient ID: [name], female    DOB: 03/07/1966, 50 y.o.   MRN: [id number]  HPI  Patient presents for boils.   Patient reports appearance of two boils in her L inguinal region three days ago. Areas have not been worsening, but have not improved. Minimal drainage from one boil after putting cream of tartar on the area to encourage drainage (patient says this is a home remedy she uses every time she has a boil). Denies pain. Some irritation of the affected area due to wearing tight jeans. Denies fevers, chills, nausea, or vomiting. Patient reports that this has been happening every couple months since she was [age].   Review of Systems See HPI.     Objective:   Physical Exam  Constitutional: She appears well-developed and well-nourished. No distress.  HENT:  Head: Normocephalic and atraumatic.  Pulmonary/Chest: Effort normal.  Skin:         Assessment & Plan:  Hidradenitis suppurativa Two new boils present in L inguinal region x3 days. Patient refusing I&D and requesting only antibiotics. No drainage and only mild fluctuance on physical exam. No systemic signs of infection.  - Doxycycline 100mg  BID x7d - Return if no improvement after course of antibiotics    Tarri AbernethyAbigail J Jannah Guardiola, MD PGY-1 [facility] Pager [phone]

## 2015-09-12 ENCOUNTER — Telehealth: Payer: Self-pay | Admitting: Family Medicine

## 2015-09-12 ENCOUNTER — Encounter (HOSPITAL_COMMUNITY): Payer: Self-pay | Admitting: Emergency Medicine

## 2015-09-12 DIAGNOSIS — J0100 Acute maxillary sinusitis, unspecified: Secondary | ICD-10-CM | POA: Insufficient documentation

## 2015-09-12 DIAGNOSIS — R0981 Nasal congestion: Secondary | ICD-10-CM | POA: Diagnosis present

## 2015-09-12 DIAGNOSIS — F17210 Nicotine dependence, cigarettes, uncomplicated: Secondary | ICD-10-CM | POA: Diagnosis not present

## 2015-09-12 NOTE — Telephone Encounter (Signed)
EMERGENCY PHONE LINE:  Reports 3 day history of congestion and cough. Denies shortness of breath, fever. Has tried multiple over the counter medications without relief. Is going on vacation Thursday and would like medication prior to that. Discussed that medications are not prescribed over the phone. Encouraged to call tomorrow morning to inquire about same day visit. No further concerns.  Dr. Caroleen Hammanumley 09/12/15, 10:34 PM

## 2015-09-12 NOTE — ED Notes (Signed)
Pt. reports nasal congestion for 3 days and dry cough today , denies fever or chills.

## 2015-09-13 ENCOUNTER — Emergency Department (HOSPITAL_COMMUNITY)
Admission: EM | Admit: 2015-09-13 | Discharge: 2015-09-13 | Disposition: A | Discharge status: Home / Self Care | Payer: Medicaid Other | Attending: Emergency Medicine | Admitting: Emergency Medicine

## 2015-09-13 DIAGNOSIS — J0100 Acute maxillary sinusitis, unspecified: Secondary | ICD-10-CM

## 2015-09-13 MED ORDER — AZITHROMYCIN 250 MG PO TABS: 250.0000 mg | ORAL_TABLET | Freq: Every day | ORAL | Status: DC

## 2015-09-13 MED ORDER — TRIAMCINOLONE ACETONIDE 55 MCG/ACT NA AERO: 2.0000 | INHALATION_SPRAY | Freq: Every day | NASAL | Status: DC

## 2015-09-13 NOTE — Discharge Instructions (Signed)

## 2015-09-13 NOTE — ED Provider Notes (Signed)
CSN: [id number]     Arrival date & time 09/12/15  2339 History   First MD Initiated Contact with Patient 09/13/15 0257     Chief Complaint  Patient presents with  . Nasal Congestion  . Cough     (Consider location/radiation/quality/duration/timing/severity/associated sxs/prior Treatment) HPI Comments: Patient presents with complaint of nasal congestion, facial pressure, sore throat for 3 days. She has tried OTC mucinex and Vicks without relief. No fever. She developed a dry cough today. No SOB, or chest tightness.   The history is provided by the patient. No language interpreter was used.    Past Medical History  Diagnosis Date  . Depression   . Anxiety   . Panic attacks   . GERD (gastroesophageal reflux disease)   . Arthritis   . VARICOSE VEINS, LOWER EXTREMITIES 06/30/2006    Qualifier: Diagnosis of  By: Seleta Rhymesowand MD, Loraine LericheMark    . TOBACCO ABUSE 04/13/2008  . 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 OVARIAN CYST 08/12/2007    Qualifier: Diagnosis of  By: Seleta Rhymesowand MD, Loraine LericheMark    . LOW BACK PAIN, CHRONIC 06/30/2006    Qualifier: Diagnosis of  By: Seleta Rhymesowand MD, Loraine LericheMark    . 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 ECZEMA, ATOPIC DERMATITIS 08/25/2007    Qualifier: Diagnosis of  By: Seleta Rhymesowand MD, Loraine LericheMark    . Bipolar 1 disorder [facility](HCC)    Past Surgical History  Procedure Laterality Date  . Knee cartilage surgery  2013    right    Family History  Problem Relation Age of Onset  . Hypertension Mother   . Diabetes Mother   . Diabetes Father   . Hypertension Father    Social History  Substance Use Topics  . Smoking status: Current Every Day Smoker -- 0.00 packs/day    Types: Cigarettes  . Smokeless tobacco: Never Used  . Alcohol Use: Yes   OB History    No data available     Review of Systems  Constitutional: Negative for fever and chills.  HENT: Positive for congestion, sinus pressure and sore throat. Negative  for trouble swallowing.   Respiratory: Positive for cough.   Cardiovascular: Negative.   Gastrointestinal: Negative.   Musculoskeletal: Negative.   Skin: Negative.   Neurological: Negative.       Allergies  Review of patient's allergies indicates no known allergies.  Home Medications   Prior to Admission medications   Medication Sig Start Date End Date Taking? Authorizing Provider  ALPRAZolam Prudy Feeler(XANAX) 1 MG tablet Take 1 mg by mouth 4 (four) times daily as needed for anxiety. For anxiety    Historical Provider, MD  butalbital-acetaminophen-caffeine [facility](FIORICET) [phone] MG tablet Take 1-2 tablets by mouth 2 (two) times daily with a meal. 05/14/15 05/13/16  Arthor CaptainAbigail Harris, PA-C  Cariprazine HCl (VRAYLAR) 1.5 MG CAPS Take 1.5 mg by mouth daily.    Historical Provider, MD  colchicine 0.6 MG tablet Take 1 tablet (0.6 mg total) by mouth daily. Take two tablets twoday 01/19/15   Narda Bondsalph A Nettey, MD  diazepam (VALIUM) 10 MG tablet Take 10 mg by mouth every 6 (six) hours as needed. Anxiety    Historical Provider, MD  divalproex (DEPAKOTE ER) 500 MG 24 hr tablet Take 500 mg by mouth at bedtime.     Historical Provider, MD  doxycycline (VIBRAMYCIN) 100 MG capsule Take 1 capsule (100 mg total) by mouth 2 (two)  times daily. 08/25/15   Marquette Saa, MD  fluconazole (DIFLUCAN) 150 MG tablet Take one tablet by mouth on Day 1. Repeat dose 2nd tablet on Day 3. Yeast may return after antibiotic. 06/19/15   Smitty Cords, DO  HYDROcodone-acetaminophen (NORCO/VICODIN) 5-325 MG tablet Take 1 tablet by mouth every 6 (six) hours as needed for moderate pain. 05/30/15   Pincus Large, DO  naproxen (NAPROSYN) 500 MG tablet TAKE 1 TABLET BY MOUTH TWICE A DAY WITH MEALS FOR 2 WEEKS, THEN AS NEEDED 07/10/15   Tyrone Nine, MD  oxyCODONE-acetaminophen (PERCOCET/ROXICET) 5-325 MG tablet Take 1 tablet by mouth every 4 (four) hours as needed for moderate pain (May repeat x1 in 30 minutes prn for continued  moderate pain.If pt requires a second dose, advise EDP.). 06/13/15   Shon Baton, MD  pantoprazole (PROTONIX) 40 MG tablet TAKE 1 TABLET (40 MG TOTAL) BY MOUTH 2 (TWO) TIMES DAILY BEFORE A MEAL. 08/24/15   Tyrone Nine, MD   BP 158/88 mmHg  Pulse 62  Temp(Src) 98.1 F (36.7 C) (Oral)  Resp 16  Ht  (1.905 m)  Wt 107.956 kg  BMI 29.75 kg/m2  SpO2 97%  LMP 08/08/2015 Physical Exam  Constitutional: She is oriented to person, place, and time. She appears well-developed and well-nourished.  HENT:  Head: Normocephalic.  Right Ear: External ear normal.  Left Ear: External ear normal.  Nose: Mucosal edema present.  Mouth/Throat: Oropharynx is clear and moist.  Neck: Normal range of motion. Neck supple.  Cardiovascular: Normal rate and normal heart sounds.   No murmur heard. Pulmonary/Chest: Effort normal and breath sounds normal. She has no wheezes. She has no rales.  Abdominal: Soft. Bowel sounds are normal. She exhibits no distension. There is no tenderness.  Musculoskeletal: Normal range of motion.  Lymphadenopathy:    She has no cervical adenopathy.  Neurological: She is alert and oriented to person, place, and time.  Skin: Skin is warm and dry. No pallor.    ED Course  Procedures (including critical care time) Labs Review Labs Reviewed - No data to display  Imaging Review No results found. I have personally reviewed and evaluated these images and lab results as part of my medical decision-making.   EKG Interpretation None      MDM   Final diagnoses:  None    1. Sinusitis  Patient with a 3-day history of progressively worsening symptoms of sinus pressure and nasal congestion interfering with breathing. OTC medications without relief. She is leaving for vacation in 2 days and is concerned she will worsen during travel.     Elpidio Anis, PA-C 09/13/15 4540  Gilda Crease, MD 09/13/15 [phone]

## 2015-10-11 ENCOUNTER — Ambulatory Visit: Payer: Self-pay | Admitting: Internal Medicine

## 2015-11-16 ENCOUNTER — Ambulatory Visit (INDEPENDENT_AMBULATORY_CARE_PROVIDER_SITE_OTHER): Payer: Medicaid Other | Admitting: Student

## 2015-11-16 ENCOUNTER — Encounter: Payer: Self-pay | Admitting: Student

## 2015-11-16 VITALS — BP 122/86 | HR 78 | Temp 98.3°F | Ht 75.0 in | Wt 233.2 lb

## 2015-11-16 DIAGNOSIS — M25561 Pain in right knee: Secondary | ICD-10-CM | POA: Diagnosis not present

## 2015-11-16 DIAGNOSIS — M545 Low back pain: Secondary | ICD-10-CM | POA: Diagnosis not present

## 2015-11-16 MED ORDER — HYDROCODONE-ACETAMINOPHEN 10-325 MG PO TABS: 1.0000 | ORAL_TABLET | Freq: Three times a day (TID) | ORAL | 0 refills | Status: DC | PRN

## 2015-11-16 NOTE — Patient Instructions (Addendum)
It was great seeing you today! We have addressed the following issues today  1. Right knee pain: I have given you pain medication. Please take this medication with caution as it can cause sedation. Please don't drive while taking this medication. I have also ordered a referral to orthopedics. Someone from our office will get in touch with you about this referral in the next 2 weeks. Please follow-up with your primary care doctor if no improvement in her pain or worsening of his symptoms or other symptoms concerning to your    If we did any lab work today, and the results require attention, either me or my nurse will get in touch with you. If everything is normal, you will get a letter in mail. If you don't hear from us in two weeks, please give us a call. Otherwise, I look forward to talking with you again at our next visit. If you have any questions or concerns before then, please call the clinic at [phone].  Please bring all your medications to every doctors visit   Sign up for My Chart to have easy access to your labs results, and communication with your Primary care physician.    Please check-out at the front desk before leaving the clinic.   Take Care,

## 2015-11-16 NOTE — Assessment & Plan Note (Signed)
>>  ASSESSMENT AND PLAN FOR KNEE PAIN, RIGHT, CHRONIC WRITTEN ON 11/16/2015 12:32 PM BY GONFA, Boyce Medici, MD  Patient with history of severe osteoarthritis. Low suspicion for infectious process at this time. It appears that her last Synvisc injection and steroid injection are unhelpful. Exam is remarkable for joint line tenderness and mild joint effusion in the right knee medially. She is able to ambulate using her cane. It is hard to assess how much pain she is in right now as her story is also a little bit confusing. She says her pain is getting worse. At the same time she says she is able to walk today. -Gave her a prescription for Norco 10/325 3 times a day when necessary severe pain, quantity 10 tablets. Discussed about the sedative effect of this medication and warned not to drive while she is taking it. -Ordered a referral to orthopedics. She has a number to the orthopedics she wanted just to see on [address]. she says she can call their office to schedule an appointment. -Recommended follow-up with her PCP as needed

## 2015-11-16 NOTE — Progress Notes (Signed)
Subjective:    Patient ID: Cathy FlackSharon L Weingartner is a 50 y.o. old female.  HPI #Right leg pain: this has been going on for two weeks since she has her [facility] . She was told to return to the clinic if she has side effect but she says she don't want to go back. She says she don't want to go back to someone who caused her this pain. She says she prefers to go to the orthopedics across from the street.   Patient went [facility] a week after she had Synovisc injection. She had an x-ray that showed no acute fracture or malalignment but positive for large joint effusion (sterility indeterminate) and moderate medial, mild lateral, and mild patellofemoral compartment degenerative changes. She was given 40 mg transluminal injection.   Patient describes the pain as stabbing like a knife. Pain all the way from her lower back to her right knee over the lateral aspect of her thigh. However, her pain is worse in her right knee. She says her pain is getting worse over the course of 2 weeks.  She she also states walking a little better today. She reports swelling around her right knee. Denies fever. Denies urinary or fecal incontinence but reports difficulty getting to the bathroom without assistance. She she states she has to wait on her daughter to comeback from her work to help her to bathroom. She states driving herself here today. She was able to walk into the clinic and to the exam room using her cane She states she tried mobic 15 mg, advil and tylenol which didn't work. Her sister gave her Vicodin 10 last night and she was able to sleep. She says Vicodin gave her complete relief.   Off note, she had similar infection 6 months ago and didn't have complication.    PMH: reviewed  Review of Systems Per HPI Objective:   Vitals:   11/16/15 1050  BP: 122/86  Pulse: 78  Temp: 98.3 F (36.8 C)  TempSrc: Oral  Weight: 233 lb 3.2 oz (105.8 kg)  Height: 6\' 3"  (1.905 m)     GEN: appears frustrated and angry at times CVS: 2+ dorsalis pedis pulses bilaterally RESP: no increased work of breathing MSK: Right knee slightly bigger than left, mild joint effusion in right knee medially, tender to palpation over the joint lines medially, anteriorly and laterally in the right knees.  Not able to tolerate a straight leg raise in her right. Straight leg raising left within normal. Sensation intact in all dermatomes. Not able to do patellar reflex and right knee due to pain. Motor 4 out of 5 in right and 5 out of 5 in left in knee flexor muscle groups. No tenderness to palpation over her lower spine or paraspinal muscles. SKIN: Skin over her right knees without erythema and not warmer than the surrounding skin NEURO: alert and oriented appropriately, no gross defecits  PSYCH: appropriate mood and affect     Assessment & Plan:  KNEE PAIN, RIGHT, CHRONIC Patient with history of severe osteoarthritis. Low suspicion for infectious process at this time. It appears that her last Synvisc injection and steroid injection are unhelpful. Exam is remarkable for joint line tenderness and mild joint effusion in the right knee medially. She is able to ambulate using her cane. It is hard to assess how much pain she is in right now as her story is also a little bit confusing. She says her pain is  getting worse. At the same time she says she is able to walk today. -Gave her a prescription for Norco 10/325 3 times a day when necessary severe pain, quantity 10 tablets. Discussed about the sedative effect of this medication and warned not to drive while she is taking it. -Ordered a referral to orthopedics. She has a number to the orthopedics she wanted just to see on [address]. she says she can call their office to schedule an appointment. -Recommended follow-up with her PCP as needed

## 2015-11-16 NOTE — Assessment & Plan Note (Signed)
Patient with history of severe osteoarthritis. Low suspicion for infectious process at this time. It appears that her last Synvisc injection and steroid injection are unhelpful. Exam is remarkable for joint line tenderness and mild joint effusion in the right knee medially. She is able to ambulate using her cane. It is hard to assess how much pain she is in right now as her story is also a little bit confusing. She says her pain is getting worse. At the same time she says she is able to walk today. -Gave her a prescription for Norco 10/325 3 times a day when necessary severe pain, quantity 10 tablets. Discussed about the sedative effect of this medication and warned not to drive while she is taking it. -Ordered a referral to orthopedics. She has a number to the orthopedics she wanted just to see on [address] she says she can call their office to schedule an appointment. -Recommended follow-up with her PCP as needed

## 2015-11-20 ENCOUNTER — Other Ambulatory Visit: Payer: Self-pay | Admitting: Family Medicine

## 2015-11-20 NOTE — Telephone Encounter (Signed)
Refill request for Protonix. Pt seen by Ortho this morning and was prescribed a steroid that will cause heartburn and patient would like to be prepared. Please advise.

## 2015-11-20 NOTE — Telephone Encounter (Signed)
Patient calling again about protonix, states she needs this filled today so that she can begin her steroid.

## 2015-11-21 MED ORDER — PANTOPRAZOLE SODIUM 40 MG PO TBEC
40.0000 mg | DELAYED_RELEASE_TABLET | Freq: Every day | ORAL | 3 refills | Status: DC
Start: 2015-11-21 — End: 2016-02-23

## 2015-11-28 ENCOUNTER — Telehealth: Payer: Self-pay | Admitting: Family Medicine

## 2015-11-28 NOTE — Telephone Encounter (Signed)
Patient came in request refill on pain medicine Hydrocodone & muscle relaxed. Please let pt. Know when complete.

## 2015-11-28 NOTE — Telephone Encounter (Deleted)
Patient came to office request refill on her pain med Hydrocodone & the RX muscle relaxer. Please let patient know when complete

## 2015-12-05 ENCOUNTER — Other Ambulatory Visit: Payer: Self-pay | Admitting: *Deleted

## 2015-12-05 ENCOUNTER — Other Ambulatory Visit: Payer: Self-pay | Admitting: Internal Medicine

## 2015-12-05 DIAGNOSIS — L732 Hidradenitis suppurativa: Secondary | ICD-10-CM

## 2015-12-07 NOTE — Telephone Encounter (Signed)
Will need to be seen before antibiotics are given to determine if that is appropriate course of action.

## 2015-12-12 NOTE — Telephone Encounter (Signed)
Patient has an appt on 12-14-15. [name],CMA

## 2015-12-14 ENCOUNTER — Encounter: Payer: Self-pay | Admitting: Family Medicine

## 2015-12-14 ENCOUNTER — Ambulatory Visit (INDEPENDENT_AMBULATORY_CARE_PROVIDER_SITE_OTHER): Payer: Medicaid Other | Admitting: Family Medicine

## 2015-12-14 VITALS — BP 139/85 | HR 62 | Temp 97.5°F | Ht 75.0 in | Wt 234.0 lb

## 2015-12-14 DIAGNOSIS — M545 Low back pain, unspecified: Secondary | ICD-10-CM

## 2015-12-14 DIAGNOSIS — L732 Hidradenitis suppurativa: Secondary | ICD-10-CM | POA: Diagnosis not present

## 2015-12-14 DIAGNOSIS — M25561 Pain in right knee: Secondary | ICD-10-CM

## 2015-12-14 MED ORDER — HYDROCODONE-ACETAMINOPHEN 10-325 MG PO TABS
1.0000 | ORAL_TABLET | Freq: Three times a day (TID) | ORAL | 0 refills | Status: DC | PRN
Start: 2015-12-14 — End: 2016-01-16

## 2015-12-14 MED ORDER — METHOCARBAMOL 500 MG PO TABS: 500.0000 mg | ORAL_TABLET | Freq: Four times a day (QID) | ORAL | 1 refills | Status: DC

## 2015-12-14 MED ORDER — PREDNISONE 20 MG PO TABS: ORAL_TABLET | ORAL | 0 refills | Status: DC

## 2015-12-14 MED ORDER — DOXYCYCLINE HYCLATE 100 MG PO CAPS: 100.0000 mg | ORAL_CAPSULE | Freq: Two times a day (BID) | ORAL | 0 refills | Status: DC

## 2015-12-14 NOTE — Patient Instructions (Signed)
Thank you so much for coming to visit today! For your knee pain, I am prescribing a steroid (Prednisone--two tablets once a day for five days) and Norco. I am also sending in Robaxin for your back pain. Please follow up with Orthopedics for further knee, hip, and back concerns. I have refilled your Doxycycline.   Dr. Caroleen Hammanumley

## 2015-12-17 NOTE — Progress Notes (Signed)
Subjective:     Patient ID: Cathy FlackSharon L Donnell, female   DOB: 11/12/1965, 50 y.o.   MRN: [id number]  HPI Mrs. Leo RodMoreland is a 50yo female presenting today for chronic pain of right knee, hip, and back. - Pain has been chronic for many years. - [facility], where she was given injections. Had a reaction to the injections. Now refusing all forms of injections. - Was referred to Orthopedics at last office visit on 8/24. [facility]. - Reports pain is 10/10 - Reports locking, giving out, crepitus - Usually uses a cane or walker to ambulate  - Pain was improved slightly with Vicodin given at last office visit. Also has prescription for Flexeril, but she needs a refill--this normally helps her back pain.  - Reports at orthopedics was given Methylprednisolone taper--finished approximately two weeks ago. Reports this also helped her pain. Was given oral steroids since she refuses injections - Also of note, has history of hidradenitis. Usually takes Doxycycline for this. Notes small boil under her right axilla. Denies fever. - Smoker  Review of Systems Per HPI    Objective:   Physical Exam  Constitutional: She appears well-developed and well-nourished. No distress.  HENT:  Head: Normocephalic and atraumatic.  Cardiovascular: Normal rate and regular rhythm.   No murmur heard. Pulmonary/Chest: No respiratory distress. She has no wheezes.  Musculoskeletal:  Effusion of right knee noted. Tenderness noted on lateral and medial joint line of right knee; no tenderness of left knee. Straight leg raise resulting in right knee pain, but no back pain. Anterior and Posterior drawers symmetric in bilateral knees. Medial and collateral ligaments intact and symmetric bilaterally. Positive Thessaly's of right knee. Muscle strength 5/5 in upper and lower extremities. Tenderness over right paraspinal muscles.  Neurological:  Sensation intact over lower extremities.   Skin:  Small indurated lesion noted in right axilla, not erythematous, no fluctuance noted  Psychiatric: She has a normal mood and affect.      Assessment and Plan:     1. Arthralgia of right lower leg - [place] Database reviewed 9/21. No red flags. - Nocro refilled #10 tablets. No further refills to be given. - To follow up with Orthopedics for further treatment of knee and back pain.  2. Low Back Pain - Flexeril refilled - Follow up with Orthopedics  3. Hidradenitis suppurativa - Boil noted in right axilla, no fluctuance noted. I&D not indicated at this time. - Doxycycline refilled. - Follow up if worsens or no improvement.

## 2015-12-23 ENCOUNTER — Emergency Department (HOSPITAL_COMMUNITY)
Admission: EM | Admit: 2015-12-23 | Discharge: 2015-12-23 | Disposition: A | Discharge status: Home / Self Care | Payer: Medicaid Other | Attending: Emergency Medicine | Admitting: Emergency Medicine

## 2015-12-23 ENCOUNTER — Encounter (HOSPITAL_COMMUNITY): Payer: Self-pay | Admitting: Emergency Medicine

## 2015-12-23 DIAGNOSIS — R0981 Nasal congestion: Secondary | ICD-10-CM | POA: Diagnosis present

## 2015-12-23 DIAGNOSIS — J069 Acute upper respiratory infection, unspecified: Secondary | ICD-10-CM | POA: Diagnosis not present

## 2015-12-23 DIAGNOSIS — Z720 Tobacco use: Secondary | ICD-10-CM

## 2015-12-23 DIAGNOSIS — F17210 Nicotine dependence, cigarettes, uncomplicated: Secondary | ICD-10-CM | POA: Diagnosis not present

## 2015-12-23 MED ORDER — PSEUDOEPHEDRINE HCL 60 MG PO TABS: 60.0000 mg | ORAL_TABLET | ORAL | 0 refills | Status: DC | PRN

## 2015-12-23 MED ORDER — OXYMETAZOLINE HCL 0.05 % NA SOLN
1.0000 | Freq: Once | NASAL | Status: AC
  Administered 2015-12-23: 1 via NASAL
  Filled 2015-12-23: qty 15

## 2015-12-23 NOTE — ED Triage Notes (Signed)
Pt states that daughter was dx with viral syndrome. Pt now suffering from same. States had a hard time sleeping last night

## 2015-12-23 NOTE — ED Provider Notes (Signed)
[facility]-EMER[facility]ENCY DEPT Provider Note   CSN: [id number] Arrival date & time: 12/23/15  [id number]     History   Chief Complaint Chief Complaint  Patient presents with  . Nasal Congestion    HPI [name] is a 50 y.o. female.  Pt presents to the ED today with sinus symptoms.  The pt said that she has been taking otc mucinex, but it has not been helping.  Sx started yesterday after her daughter had the same sx.  The pt has a steroid nasal spray that has not been helping either.  Pt denies any cough or fever.  Pt smokes and has been trying to stop, but has not been successful.      Past Medical History:  Diagnosis Date  . Anxiety   . Arthritis   . Bipolar 1 disorder (HCC)   . 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 Depression   . ECZEMA, ATOPIC DERMATITIS 08/25/2007   Qualifier: Diagnosis of  By: Seleta Rhymesowand MD, Loraine LericheMark    . GERD (gastroesophageal reflux disease)   . LOW BACK PAIN, CHRONIC 06/30/2006   Qualifier: Diagnosis of  By: Seleta Rhymesowand MD, Loraine LericheMark    . OVARIAN CYST 08/12/2007   Qualifier: Diagnosis of  By: Seleta Rhymesowand MD, Loraine LericheMark    . Panic attacks   . 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 TOBACCO ABUSE 04/13/2008  . VARICOSE VEINS, LOWER EXTREMITIES 06/30/2006   Qualifier: Diagnosis of  By: Seleta Rhymesowand MD, Mark      Patient Active Problem List   Diagnosis Date Noted  . Loss of balance 08/08/2014  . Peripheral neuropathy (HCC) 08/08/2014  . Acute periapical abscess 05/19/2014  . Hidradenitis suppurativa 12/07/2013  . GERD (gastroesophageal reflux disease) 04/08/2012  . Headache(784.0) 04/08/2012  . Osteoarthritis of knee 03/05/2012  . Arthritis of knee, degenerative 03/05/2012  . 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 SHOULDER PAIN, RIGHT 04/28/2009  . ELBOW PAIN, RIGHT 10/03/2008  . TOBACCO ABUSE 04/13/2008  . KNEE PAIN, RIGHT, CHRONIC 04/01/2008  . IRREGULAR MENSES 11/03/2007  . ANXIETY STATE NOS 02/10/2007    . DISORDER, BIPOLAR NOS 10/29/2006  . VARICOSE VEINS, LOWER EXTREMITIES 06/30/2006  . LOW BACK PAIN, CHRONIC 06/30/2006  . INSOMNIA 06/30/2006    Past Surgical History:  Procedure Laterality Date  . KNEE CARTILAGE SURGERY  2013   right     OB History    No data available       Home Medications    Prior to Admission medications   Medication Sig Start Date End Date Taking? Authorizing Provider  ALPRAZolam Prudy Feeler(XANAX) 1 MG tablet Take 1 mg by mouth 4 (four) times daily as needed for anxiety. For anxiety    Historical Provider, MD  azithromycin (ZITHROMAX) 250 MG tablet Take 1 tablet (250 mg total) by mouth daily. Take first 2 tablets together, then 1 every day until finished. 09/13/15   Elpidio AnisShari Upstill, PA-C  butalbital-acetaminophen-caffeine (FIORICET) [id number] MG tablet Take 1-2 tablets by mouth 2 (two) times daily with a meal. 05/14/15 05/13/16  Arthor CaptainAbigail Harris, PA-C  Cariprazine HCl (VRAYLAR) 1.5 MG CAPS Take 1.5 mg by mouth daily.    Historical Provider, MD  colchicine 0.6 MG tablet Take 1 tablet (0.6 mg total) by mouth daily. Take two tablets twoday 01/19/15   Narda Bondsalph A Nettey, MD  diazepam (VALIUM) 10 MG tablet Take 10 mg by mouth every 6 (six) hours as needed. Anxiety  Historical Provider, MD  divalproex (DEPAKOTE ER) 500 MG 24 hr tablet Take 500 mg by mouth at bedtime.     Historical Provider, MD  doxycycline (VIBRAMYCIN) 100 MG capsule Take 1 capsule (100 mg total) by mouth 2 (two) times daily. 12/14/15   [place] N Rumley, DO  fluconazole (DIFLUCAN) 150 MG tablet Take one tablet by mouth on Day 1. Repeat dose 2nd tablet on Day 3. Yeast may return after antibiotic. 06/19/15   Smitty Cords, DO  HYDROcodone-acetaminophen (NORCO) 10-325 MG tablet Take 1 tablet by mouth every 8 (eight) hours as needed. 12/14/15   [place] N Rumley, DO  methocarbamol (ROBAXIN) 500 MG tablet Take 1 tablet (500 mg total) by mouth 4 (four) times daily. 12/14/15   [place] N Rumley, DO  naproxen  (NAPROSYN) 500 MG tablet TAKE 1 TABLET BY MOUTH TWICE A DAY WITH MEALS FOR 2 WEEKS, THEN AS NEEDED 07/10/15   Tyrone Nine, MD  pantoprazole (PROTONIX) 40 MG tablet TAKE 1 TABLET (40 MG TOTAL) BY MOUTH 2 (TWO) TIMES DAILY BEFORE A MEAL. 08/24/15   Tyrone Nine, MD  pantoprazole (PROTONIX) 40 MG tablet Take 1 tablet (40 mg total) by mouth daily. 11/21/15   [place] N Rumley, DO  predniSONE (DELTASONE) 20 MG tablet Please take two tablets daily for 5 days. 12/14/15   [place] N Rumley, DO  pseudoephedrine (SUDAFED) 60 MG tablet Take 1 tablet (60 mg total) by mouth every 4 (four) hours as needed for congestion. 12/23/15   Jacalyn Lefevre, MD  triamcinolone (NASACORT AQ) 55 MCG/ACT AERO nasal inhaler Place 2 sprays into the nose daily. 09/13/15   Elpidio Anis, PA-C    Family History Family History  Problem Relation Age of Onset  . Hypertension Mother   . Diabetes Mother   . Diabetes Father   . Hypertension Father     Social History Social History  Substance Use Topics  . Smoking status: Current Every Day Smoker    Packs/day: 0.00    Types: Cigarettes  . Smokeless tobacco: Never Used  . Alcohol use Yes     Allergies   Review of patient's allergies indicates no known allergies.   Review of Systems Review of Systems  HENT: Positive for congestion.   All other systems reviewed and are negative.    Physical Exam Updated Vital Signs BP 153/88   Pulse 65   Temp 98 F (36.7 C) (Oral)   Resp 20   Ht 6\' 3"  (1.905 m)   Wt 237 lb (107.5 kg)   SpO2 99%   BMI 29.62 kg/m   Physical Exam  Constitutional: She is oriented to person, place, and time. She appears well-developed and well-nourished.  HENT:  Head: Normocephalic and atraumatic.  Right Ear: External ear normal.  Left Ear: External ear normal.  Mouth/Throat: Oropharynx is clear and moist.  Sinus inflammation.  No purulent drainage.  Eyes: Conjunctivae and EOM are normal. Pupils are equal, round, and reactive to light.  Neck:  Normal range of motion. Neck supple.  Cardiovascular: Normal rate, regular rhythm, normal heart sounds and intact distal pulses.   Pulmonary/Chest: Effort normal and breath sounds normal.  Abdominal: Soft. Bowel sounds are normal.  Musculoskeletal: Normal range of motion.  Neurological: She is alert and oriented to person, place, and time.  Skin: Skin is warm and dry.  Psychiatric: She has a normal mood and affect. Her behavior is normal. Judgment and thought content normal.  Nursing note and vitals reviewed.  ED Treatments / Results  Labs (all labs ordered are listed, but only abnormal results are displayed) Labs Reviewed - No data to display  EKG  EKG Interpretation None       Radiology No results found.  Procedures Procedures (including critical care time)  Medications Ordered in ED Medications  oxymetazoline (AFRIN) 0.05 % nasal spray 1 spray (not administered)     Initial Impression / Assessment and Plan / ED Course  I have reviewed the triage vital signs and the nursing notes.  Pertinent labs & imaging results that were available during my care of the patient were reviewed by me and considered in my medical decision making (see chart for details).  Clinical Course    Pt encouraged not to smoke.  She is given afrin here and a rx for sudafed.  She knows to return if worse.  Final Clinical Impressions(s) / ED Diagnoses   Final diagnoses:  URI (upper respiratory infection)  Tobacco abuse    New Prescriptions New Prescriptions   PSEUDOEPHEDRINE (SUDAFED) 60 MG TABLET    Take 1 tablet (60 mg total) by mouth every 4 (four) hours as needed for congestion.     Jacalyn Lefevre, MD 12/23/15 [phone]

## 2016-01-15 ENCOUNTER — Other Ambulatory Visit: Payer: Self-pay | Admitting: Family Medicine

## 2016-01-16 ENCOUNTER — Ambulatory Visit (INDEPENDENT_AMBULATORY_CARE_PROVIDER_SITE_OTHER): Payer: Medicaid Other | Admitting: Obstetrics and Gynecology

## 2016-01-16 ENCOUNTER — Encounter: Payer: Self-pay | Admitting: Obstetrics and Gynecology

## 2016-01-16 VITALS — BP 128/85 | HR 74 | Temp 98.4°F | Ht 75.0 in | Wt 236.2 lb

## 2016-01-16 DIAGNOSIS — L732 Hidradenitis suppurativa: Secondary | ICD-10-CM

## 2016-01-16 DIAGNOSIS — M545 Low back pain, unspecified: Secondary | ICD-10-CM

## 2016-01-16 DIAGNOSIS — G8929 Other chronic pain: Secondary | ICD-10-CM | POA: Diagnosis not present

## 2016-01-16 DIAGNOSIS — M170 Bilateral primary osteoarthritis of knee: Secondary | ICD-10-CM

## 2016-01-16 MED ORDER — CYCLOBENZAPRINE HCL 10 MG PO TABS: 10.0000 mg | ORAL_TABLET | Freq: Three times a day (TID) | ORAL | 1 refills | Status: DC | PRN

## 2016-01-16 MED ORDER — DOXYCYCLINE HYCLATE 100 MG PO CAPS: 100.0000 mg | ORAL_CAPSULE | Freq: Two times a day (BID) | ORAL | 0 refills | Status: DC

## 2016-01-16 MED ORDER — KETOROLAC TROMETHAMINE 60 MG/2ML IM SOLN
60.0000 mg | Freq: Once | INTRAMUSCULAR | Status: AC
  Administered 2016-01-16: 60 mg via INTRAMUSCULAR
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
## 2016-01-16 NOTE — Patient Instructions (Signed)

## 2016-02-23 ENCOUNTER — Other Ambulatory Visit: Payer: Self-pay | Admitting: Family Medicine

## 2016-02-26 NOTE — Telephone Encounter (Signed)
Pt was told by pharmacy she needed prior authorization for her pantoprazole.  She has not had any pills since Friday and really needs them

## 2016-03-05 ENCOUNTER — Encounter: Payer: Self-pay | Admitting: Family Medicine

## 2016-03-05 ENCOUNTER — Ambulatory Visit: Payer: Self-pay | Admitting: Family Medicine

## 2016-03-05 ENCOUNTER — Ambulatory Visit (HOSPITAL_COMMUNITY)
Admission: RE | Admit: 2016-03-05 | Discharge: 2016-03-05 | Disposition: A | Discharge status: Home / Self Care | Payer: Medicaid Other | Source: Ambulatory Visit | Attending: Family Medicine | Admitting: Family Medicine

## 2016-03-05 ENCOUNTER — Ambulatory Visit (INDEPENDENT_AMBULATORY_CARE_PROVIDER_SITE_OTHER): Payer: Medicaid Other | Admitting: Family Medicine

## 2016-03-05 VITALS — BP 138/88 | HR 79 | Temp 97.9°F | Wt 234.0 lb

## 2016-03-05 DIAGNOSIS — X58XXXA Exposure to other specified factors, initial encounter: Secondary | ICD-10-CM | POA: Insufficient documentation

## 2016-03-05 DIAGNOSIS — S99921A Unspecified injury of right foot, initial encounter: Secondary | ICD-10-CM | POA: Insufficient documentation

## 2016-03-05 DIAGNOSIS — M545 Low back pain: Secondary | ICD-10-CM | POA: Diagnosis not present

## 2016-03-05 MED ORDER — POST-OP SHOE/SOFT TOP WOMEN MISC: 0 refills | Status: DC

## 2016-03-05 NOTE — Progress Notes (Signed)
    Subjective:  [name] is a 50 y.o. female who presents to the [facility] today with a chief complaint of foot injury.   HPI:  Foot Injury About 3-4 weeks ago patient was moving furniture at her house when she dropped something on her right foot. She does not remember exactly what it was, but thinks that it may be a dresser drawer. She had pain and swelling at first, which improved, however over the last couple of days she has had a persistence of the pain in that area. She has tried using [employer] which helped some. Pain is described as a throbbing sensation and is intermittent in nature. It hurts for anything to touch the area and it hurts when she walks. Patient stated that she picked skin off the injury site yesterday and noticed red skin underneath.  ROS: Per HPI  PMH: Smoking history reviewed.    Objective:  Physical Exam: BP 138/88   Pulse 79   Temp 97.9 F (36.6 C) (Oral)   Wt 234 lb (106.1 kg)   LMP 02/10/2016   SpO2 99%   BMI 29.25 kg/m   Gen: NAD, resting comfortably Pulm: NWOB MSK:  -Right Foot: 1cm indurated lesion at the base of the 4th and 5th interdigital area. Lesion very painful to palpation with minimal surrounding edema. No surrounding erythema. Patient tender to palpation along 4th and 5th metatarsals. FROM in digits.  Skin: warm, dry Neuro: grossly normal, moves all extremities  Assessment/Plan:  Right Foot Injury Patient unable to provide a clear history surrounding the events of the injury. Apparently, she dropped an item on her foot several weeks ago and thought that she had broken her foot. Given the persistence of the patient's pain and wide area of distribution, will obtain plain film to rule out nonhealing fracture. Also wrote a prescription for a post-op shoe, however patient stated that she would be unable to afford this and we did not have any available in clinic to give her. Placed a referral to sports medicine so that she will hopefully be  able to get a post op shoe. CRPS is also on the differential given her history of trauma, edema, and what seems to be allodynia in the area. Lesion on foot likely secondary to reactive changes related to her trauma, though could not rule out abscess (though patient without any surrounding erythema or fluctuance). I offered attempted I&D today however patient deferred. Recommended ibuprofen as needed for pain. Strict return precautions reviewed. Follow up either with us or sports medicine within the next 2-3 weeks for recheck.   Katina Degreealeb M. Jimmey RalphParker, MD [facility] Resident PGY-3 03/05/2016 2:08 PM

## 2016-03-05 NOTE — Patient Instructions (Signed)
Go to [facility] to get an xray.  Go to a medical supply store to pick up a post op shoe.  Come back in 3 weeks for recheck.  If it gets worse, please let us know.  Take care,  Dr Jimmey RalphParker

## 2016-03-06 ENCOUNTER — Telehealth: Payer: Self-pay | Admitting: Family Medicine

## 2016-03-06 NOTE — Telephone Encounter (Signed)
Pt informed and voiced understanding. Pt did not want to speak to Dr. Jimmey RalphParker or had any further questions.

## 2016-03-11 ENCOUNTER — Ambulatory Visit: Payer: Medicaid Other | Admitting: Sports Medicine

## 2016-03-26 ENCOUNTER — Other Ambulatory Visit: Payer: Self-pay | Admitting: Obstetrics and Gynecology

## 2016-03-26 DIAGNOSIS — L732 Hidradenitis suppurativa: Secondary | ICD-10-CM

## 2016-03-28 ENCOUNTER — Ambulatory Visit: Payer: Self-pay | Admitting: Family Medicine

## 2016-03-29 ENCOUNTER — Ambulatory Visit: Payer: Self-pay | Admitting: Family Medicine

## 2016-04-12 ENCOUNTER — Other Ambulatory Visit: Payer: Self-pay | Admitting: Family Medicine

## 2016-04-12 NOTE — Telephone Encounter (Signed)
Pt is calling because she needs a new prescription for her Protonix. She is taking more than prescribed and needs this either stronger. She also needs more than 30 qty for a month. Please call to discuss and also let her know when she can come and pick this up. jw

## 2016-04-12 NOTE — Telephone Encounter (Signed)
She needs a visit to be seen if she is taking more than prescribed. We may need to further evaluate her pain.

## 2016-04-12 NOTE — Telephone Encounter (Signed)
Scheduled patient an appointment for 04/23/16. Explained that if symptoms worsen to call and schedule for a SDA. Maryjean Mornempestt S Roberts, CMA

## 2016-04-23 ENCOUNTER — Ambulatory Visit: Payer: Self-pay | Admitting: Family Medicine

## 2016-05-28 ENCOUNTER — Ambulatory Visit: Payer: Self-pay | Admitting: Family Medicine

## 2016-05-29 ENCOUNTER — Encounter: Payer: Self-pay | Admitting: Family Medicine

## 2016-05-29 ENCOUNTER — Ambulatory Visit (INDEPENDENT_AMBULATORY_CARE_PROVIDER_SITE_OTHER): Payer: Medicaid Other | Admitting: Family Medicine

## 2016-05-29 ENCOUNTER — Ambulatory Visit: Payer: Medicaid Other | Admitting: Family Medicine

## 2016-05-29 DIAGNOSIS — M545 Low back pain: Secondary | ICD-10-CM | POA: Diagnosis present

## 2016-05-29 DIAGNOSIS — M25561 Pain in right knee: Secondary | ICD-10-CM | POA: Diagnosis not present

## 2016-05-29 MED ORDER — METHYLPREDNISOLONE ACETATE 40 MG/ML IJ SUSP
40.0000 mg | Freq: Once | INTRAMUSCULAR | Status: AC
  Administered 2016-05-29: 40 mg via INTRAMUSCULAR

## 2016-05-29 NOTE — Assessment & Plan Note (Signed)
Patient is here with signs and symptoms consistent with right-sided knee osteoarthritis. Patient states that she regularly gets steroid injections. She has tried Synvisc injections in the past which caused a significant allergic response. [facility], but came in today for a joint injection because she felt the pain was becoming debilitating and she did not want to wait. Prior to injection I [facility] to make sure patient was outside a 3 month window since her last injection. - Patient tolerated injection well. Right-sided joint injection provided. - Post injection precautions discussed. - Return precautions discussed. - Patient had no further questions.

## 2016-05-29 NOTE — Progress Notes (Unsigned)
   CC: ***  HPI  CC, SH/smoking status, and VS noted  Objective: There were no vitals taken for this visit. Gen: NAD, alert, cooperative, and pleasant.*** HEENT: NCAT, EOMI, PERRL CV: RRR, no murmur Resp: CTAB, no wheezes, non-labored Abd: SNTND, BS present, no guarding or organomegaly Ext: No edema, warm Neuro: Alert and oriented, Speech clear, No gross deficits  Assessment and plan:  No problem-specific Assessment & Plan notes found for this encounter.   No orders of the defined types were placed in this encounter.   No orders of the defined types were placed in this encounter.    Loni MuseKate Edina Winningham, MD, PGY1 05/29/2016 3:28 PM

## 2016-05-29 NOTE — Progress Notes (Signed)
   HPI  CC: Right knee pain Patient is here with complaints of right-sided knee pain. Pain is worse with steps. She has been dealing with this issue for a significant amount of time. [facility] She states that she missed her appointment for "another round"of injections. Right knee pain is achy in nature. It does not radiate. Pain is similar to what her typical arthritic pain is. She denies any mechanical symptoms. She endorses some occasional swelling but no significant effusion today. No erythema. Occasional warmth. No recent injury or trauma. Denies fevers.  [facility] to obtain patient's most recent documented knee injection. I was informed by the front staff that it appeared as though her most recent knee injection was in August 2016.  Review of Systems See HPI for ROS.   CC, SH/smoking status, and VS noted  Objective: BP 140/88   Pulse 65   Temp 97.7 F (36.5 C) (Oral)   Ht 6\' 3"  (1.905 m)   Wt 233 lb 12.8 oz (106.1 kg)   BMI 29.22 kg/m  Gen: NAD, alert, cooperative. CV: Well-perfused. Resp: Non-labored. Neuro: Sensation intact throughout. Knee, Right: Inspection was negative for erythema, negative for effusion, and negative for obvious bony abnormalities. Palpation was negative for obvious Baker's cyst development, negative for asymmetric warmth, positive for joint line tenderness, negative for condyle tenderness, positive for patellar tenderness, positive for patellar crepitus, and negative for tenderness of the pes anserine bursa. Patellar and quadriceps tendons unremarkable. ROM limited due to pain in flexion and extension. Normal hamstring and quadriceps strength. Neurovascularly intact bilaterally.  - Ligaments: (Solid and consistent endpoints)   - ACL (present bilaterally)   - PCL (did not examine)   - LCL (present bilaterally)   - MCL (present bilaterally).   INJECTION: Right Knee  Patient was given  informed consent, signed copy in the chart. Appropriate time out was taken. Area prepped and draped in usual sterile fashion. 1 cc of methylprednisolone 40 mg/ml plus  4 cc of 1% lidocaine without epinephrine was injected into the Right Knee using a(n) inferiolateral approach. The patient tolerated the procedure well. There were no complications. Post procedure instructions were given. [Performed by Dr. Chanetta Marshallimberlake]  Assessment and plan:  KNEE PAIN, RIGHT, CHRONIC Patient is here with signs and symptoms consistent with right-sided knee osteoarthritis. Patient states that she regularly gets steroid injections. She has tried Synvisc injections in the past which caused a significant allergic response. [facility], but came in today for a joint injection because she felt the pain was becoming debilitating and she did not want to wait. Prior to injection I [facility] to make sure patient was outside a 3 month window since her last injection. - Patient tolerated injection well. Right-sided joint injection provided. - Post injection precautions discussed. - Return precautions discussed. - Patient had no further questions.   Kathee DeltonIan D Jaylene Arrowood, MD,MS,  PGY3 05/29/2016 4:45 PM

## 2016-05-29 NOTE — Assessment & Plan Note (Signed)
>>  ASSESSMENT AND PLAN FOR KNEE PAIN, RIGHT, CHRONIC WRITTEN ON 05/29/2016  4:14 PM BY MCKEAG, IAN D, MD  Patient is here with signs and symptoms consistent with right-sided knee osteoarthritis. Patient states that she regularly gets steroid injections. She has tried Synvisc injections in the past which caused a significant allergic response. Patient is typically seen at [facility], but came in today for a joint injection because she felt the pain was becoming debilitating and she did not want to wait. Prior to injection I contacted [facility] to make sure patient was outside a 3 month window since her last injection. - Patient tolerated injection well. Right-sided joint injection provided. - Post injection precautions discussed. - Return precautions discussed. - Patient had no further questions.

## 2016-05-30 ENCOUNTER — Other Ambulatory Visit: Payer: Self-pay | Admitting: Family Medicine

## 2016-06-07 ENCOUNTER — Ambulatory Visit: Payer: Self-pay | Admitting: Family Medicine

## 2016-08-02 ENCOUNTER — Telehealth: Payer: Self-pay

## 2016-08-02 NOTE — Telephone Encounter (Signed)
[name] with [facility] [phone] called asking if Dr. Deirdre Priesthambliss received a  request for medical necessity for incontance supplies?  They can also take a verbal order over the phone. [name], CMA

## 2016-08-06 NOTE — Telephone Encounter (Signed)
2nd request. [phone] verbal orders for adult pull ups. ep

## 2016-08-06 NOTE — Telephone Encounter (Signed)
Routing to Dr. Deirdre Priesthambliss as well since the message asked if he had received anything for this request. [name], April D, CMA

## 2016-08-07 NOTE — Telephone Encounter (Signed)
3rd request. They would like a verbal order today. Please call [phone].  ep

## 2016-08-08 NOTE — Telephone Encounter (Signed)
No documented reason for incontinence supplies--has denied urinary and fecal incontinence previously. Will need office visit before this will be completed.

## 2016-08-08 NOTE — Telephone Encounter (Signed)
Called and spoke with [facility]. Pt filled out an electronic request for supplies. They will call pt and discuss how to get the supplies. Sunday SpillersSharon T Saunders, CMA

## 2016-09-09 ENCOUNTER — Ambulatory Visit (INDEPENDENT_AMBULATORY_CARE_PROVIDER_SITE_OTHER): Payer: Medicaid Other | Admitting: Student in an Organized Health Care Education/Training Program

## 2016-09-09 ENCOUNTER — Encounter: Payer: Self-pay | Admitting: Student in an Organized Health Care Education/Training Program

## 2016-09-09 DIAGNOSIS — K1379 Other lesions of oral mucosa: Secondary | ICD-10-CM

## 2016-09-09 DIAGNOSIS — M25561 Pain in right knee: Secondary | ICD-10-CM | POA: Diagnosis present

## 2016-09-09 MED ORDER — AMOXICILLIN 875 MG PO TABS: 875.0000 mg | ORAL_TABLET | Freq: Two times a day (BID) | ORAL | 0 refills | Status: DC

## 2016-09-09 MED ORDER — IBUPROFEN 800 MG PO TABS: 800.0000 mg | ORAL_TABLET | Freq: Three times a day (TID) | ORAL | 0 refills | Status: DC | PRN

## 2016-09-09 NOTE — Assessment & Plan Note (Signed)
>>  ASSESSMENT AND PLAN FOR KNEE PAIN, RIGHT, CHRONIC WRITTEN ON 09/09/2016  5:05 PM BY Howard Pouch, MD  - likely due to osteoarthritis - last injection was in 05/2016 in our office, called patient's previous orthoped[facility] to confirm she is outside the 3 month window for another injection -  right intra-articular injection with depo-medrol at today's visit - return precautions advised - ibuprofen prescribed for joint discomfort after injection

## 2016-09-09 NOTE — Progress Notes (Signed)
CC: Right knee pain  HPI: [name] is a 51 y.o. female with PMH significant for bipolar disorder, joint pain, arthritis of knee who presents to [facility] today as a same day appointment with knee pain and mouth pain.    [facility] was contacted and informed provider that the most recent injection she has had in their office was prior to 10/31/2015.  Patient underwent right knee cortisone injection on 05/29/2016 in our office.  Right knee pain - consistent with previous osteoarthritis pain. - 9/10 in severity, does not radiate - 3-4 years - last injection in march of this year  - pain is worst at night, associated with cramps in calf - does not take anything over the counter for pain  Mouth sore - right gum pain over upper incisor - 1-2 months, worsened over past week - has had infection in this area in the past, last time was 1 year ago - cannot chew on that side - no fevers - also describes pain over left upper tooth socket  Review of Symptoms:  See HPI for ROS.   CC, SH/smoking status, and VS noted.  Objective: BP 126/84   Pulse 70   Temp 98 F (36.7 C) (Oral)   Ht 6\' 3"  (1.905 m)   Wt 228 lb 3.2 oz (103.5 kg)   LMP 08/23/2016   SpO2 98%   BMI 28.52 kg/m  GEN: NAD, alert, blunted affect, cooperative R Knee: pain with palpation over patella and with passive movement of the knee L knee: no tenderness to palpation MOUTH: No abscess, redness, or drainage over right incisor where there is tenderness to palpation, +tooth socket noted on left side where she states tooth was previously removed, no drainage or redness, no signs of infection Psych: AAOx3, blunted affect  Knee Injection: Written and verbal consent was obtained after discussing the risks and benefits of the procedure with the patient. The anterior right knee was cleansed in a sterile fashion with betadine. 40 mg depo medrol and 3 cc 1% Lidocaine was injected using an anterolateral approach using  a 5 cc syringe and 25 gauge needle. No complications were encountered. Minimal blood loss. A band aid was applied.   Assessment and plan:  KNEE PAIN, RIGHT, CHRONIC - likely due to osteoarthritis - last injection was in 05/2016 in our office, called patient's previous orthopedist [facility] to confirm she is outside the 3 month window for another injection -  right intra-articular injection with depo-medrol at today's visit - return precautions advised - ibuprofen prescribed for joint discomfort after injection  Mouth pain Pain over right upper inciser in the gum, no drainage, redness, abscess or other indication of infection.  Patient does have a missing tooth on upper left side which she endorses has also been painful. - amoxicillin 875 BID x5d due to open tooth socket and risk for infection - medicaid dentist list provided - recommend dental follow up as soon as possible   Meds ordered this encounter  Medications  . ibuprofen (ADVIL,MOTRIN) 800 MG tablet    Sig: Take 1 tablet (800 mg total) by mouth every 8 (eight) hours as needed.    Dispense:  15 tablet    Refill:  0  . amoxicillin (AMOXIL) 875 MG tablet    Sig: Take 1 tablet (875 mg total) by mouth 2 (two) times daily.    Dispense:  10 tablet    Refill:  0    Howard Pouch, MD,MS,  PGY1 09/09/2016 5:06 PM

## 2016-09-09 NOTE — Assessment & Plan Note (Addendum)
-   likely due to osteoarthritis - last injection was in 05/2016 in our office, called patient's previous orthopedist [facility] to confirm she is outside the 3 month window for another injection -  right intra-articular injection with depo-medrol at today's visit - return precautions advised - ibuprofen prescribed for joint discomfort after injection

## 2016-09-09 NOTE — Assessment & Plan Note (Addendum)
Pain over right upper inciser in the gum, no drainage, redness, abscess or other indication of infection.  Patient does have a missing tooth on upper left side which she endorses has also been painful. - amoxicillin 875 BID x5d due to open tooth socket and risk for infection - medicaid dentist list provided - recommend dental follow up as soon as possible

## 2016-09-09 NOTE — Patient Instructions (Signed)
It was a pleasure seeing you today in our clinic. Today we discussed knee pain and mouth pain. Here is the treatment plan we have discussed and agreed upon together:  Your knee was injected with steroids today. If you see some redness in the area this may be a local reaction and you may require some ibuprofen. If you notice redness and warmth in the area that does not resolve this may be a sign of infection and it is important that you come back into the office.  For your mouth pain you were given a short course of antibiotics and a list of dentists that accept medicaid. Please call to schedule an appointment with one of these providers as soon as possible.  Our clinic's number is [phone]. Please call with questions or concerns about what we discussed today.  Be well, Dr. Mosetta PuttFeng

## 2016-10-07 ENCOUNTER — Other Ambulatory Visit: Payer: Self-pay | Admitting: Family Medicine

## 2016-10-07 NOTE — Telephone Encounter (Signed)
Pt calling to request refill of:  Name of Medication(s): protonix Last date of OV:09-09-16  Pharmacy: [facility]  Will route refill request to Clinic RN.  Discussed with patient policy to call pharmacy for future refills.  Also, discussed refills may take up to 48 hours to approve or deny.  Avanell ShackletonHarriet C Shelton

## 2016-10-08 ENCOUNTER — Telehealth: Payer: Self-pay | Admitting: Internal Medicine

## 2016-10-08 NOTE — Telephone Encounter (Signed)
[facility] After Hours Telephone Line   Patient calling requesting refill on GERD medication. Says she sent in a refill request and [facility] still have not [facility]ill prescription. Says she has not slept in three nights because her reflux has been bothering her. Reminded patient that this is an emergency line, not a prescription refill line. Also reminded patient of 48 hr refill policy. Suggested patient call when clinic is open tomorrow to inquire on status of her prescription.   Tarri AbernethyAbigail J Delphin Funes, MD, MPH PGY-3 [facility] Pager [phone]

## 2016-10-09 MED ORDER — PANTOPRAZOLE SODIUM 40 MG PO TBEC: 40.0000 mg | DELAYED_RELEASE_TABLET | Freq: Every day | ORAL | 3 refills | Status: DC

## 2016-10-09 NOTE — Telephone Encounter (Signed)
Pt called again about her refill.  She says she isnt sleeping because of not having her medicine.

## 2016-12-11 ENCOUNTER — Encounter: Payer: Self-pay | Admitting: Family Medicine

## 2016-12-11 ENCOUNTER — Ambulatory Visit (INDEPENDENT_AMBULATORY_CARE_PROVIDER_SITE_OTHER): Payer: Medicaid Other | Admitting: Family Medicine

## 2016-12-11 VITALS — BP 130/78 | HR 88 | Temp 98.6°F | Ht 75.0 in | Wt 224.0 lb

## 2016-12-11 DIAGNOSIS — M25561 Pain in right knee: Secondary | ICD-10-CM | POA: Diagnosis present

## 2016-12-11 DIAGNOSIS — G8929 Other chronic pain: Secondary | ICD-10-CM

## 2016-12-11 MED ORDER — METHOCARBAMOL 500 MG PO TABS: 500.0000 mg | ORAL_TABLET | Freq: Four times a day (QID) | ORAL | 11 refills | Status: DC

## 2016-12-11 MED ORDER — METHYLPREDNISOLONE ACETATE 40 MG/ML IJ SUSP
40.0000 mg | Freq: Once | INTRAMUSCULAR | Status: AC
  Administered 2016-12-11: 40 mg via INTRAMUSCULAR

## 2016-12-11 NOTE — Assessment & Plan Note (Addendum)
Pain due to chronic osteoarthritis.  Given steroid injection in office today.  Referred to [facility] for reevaluation of candidacy for a knee replacement.  She was advised that steroid injections would have diminishing benefit after repeated injections and that steroid injections could cause degradation of the small amount of cartilage she has left.  Congratulated on weight loss and encouraged to continue weight loss for arthritis pain relief.  Knee Injection: Written and verbal consent was obtained after discussing the risks and benefits of the procedure with the patient. The anterior right knee was cleansed in a sterile fashion with betadine. 40 mg depo medrol and 3 cc 1% Lidocaine was injected using an anterolateral approach using a 5 cc syringe and 25 gauge needle. No complications were encountered. Minimal blood loss. A band aid was applied.

## 2016-12-11 NOTE — Progress Notes (Signed)
Subjective:    [name] - 51 y.o. female MRN [id number]  Date of birth: 25-Feb-1966  HPI  [name] is a 51 y.o. Female with a PMH of bipolar disorder and right knee osteoarthritis here for injection for her right knee pain and for refill of her muscle relaxer.    Right Knee Pain, Chronic Her last injection was at our office on 09/09/16, so she is just over the 3 month window and is eligible for another injection.  She says that her pain is alleviated by a hot pad, and she is trying to lose weight in order to lessen her pain.  She says that she was told that she could not get a knee replacement because "her arthritis was too bad."  She received viscosupplementation in the past, but her knee became very swollen afterward, so she says that "it did not agree with me."  Low Back Pain She also says that she needs a refill of her muscle relaxer that she uses for her back spasms.  Health Maintenance:  Health Maintenance Due  Topic Date Due  . HIV Screening  12/26/1980  . TETANUS/TDAP  12/26/1984  . PAP SMEAR  07/26/2010  . MAMMOGRAM  12/27/2015  . COLONOSCOPY  12/27/2015  . INFLUENZA VACCINE  10/23/2016    -  reports that she has been smoking Cigarettes.  She has been smoking about 0.00 packs per day. She has never used smokeless tobacco. - Review of Systems: Per HPI. - Past Medical History: Patient Active Problem List   Diagnosis Date Noted  . Loss of balance 08/08/2014  . Peripheral neuropathy 08/08/2014  . Acute periapical abscess 05/19/2014  . Hidradenitis suppurativa 12/07/2013  . Mouth pain 05/18/2012  . GERD (gastroesophageal reflux disease) 04/08/2012  . Headache(784.0) 04/08/2012  . Osteoarthritis of knee 03/05/2012  . Arthritis of knee, degenerative 03/05/2012  . 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 SHOULDER PAIN, RIGHT 04/28/2009  . ELBOW PAIN, RIGHT 10/03/2008  . TOBACCO ABUSE 04/13/2008  . KNEE PAIN, RIGHT, CHRONIC 04/01/2008  . IRREGULAR MENSES 11/03/2007    . ANXIETY STATE NOS 02/10/2007  . DISORDER, BIPOLAR NOS 10/29/2006  . VARICOSE VEINS, LOWER EXTREMITIES 06/30/2006  . LOW BACK PAIN, CHRONIC 06/30/2006  . INSOMNIA 06/30/2006   - Medications: reviewed and updated   Objective:   Physical Exam BP 130/78   Pulse 88   Temp 98.6 F (37 C) (Oral)   Ht  (1.905 m)   Wt 101.6 kg (224 lb)   LMP 11/17/2016   SpO2 97%   BMI 28.00 kg/m  Gen: NAD, alert, cooperative with exam, well-appearing CV: RRR, good S1/S2, no murmur, no edema, capillary refill brisk  Resp: CTABL, no wheezes, non-labored Skin: no rashes, normal turgor  Neuro: no gross deficits.  Psych: good insight, alert and oriented      Assessment & Plan:   KNEE PAIN, RIGHT, CHRONIC Pain due to chronic osteoarthritis.  Given steroid injection in office today.  Referred to [facility] for reevaluation of candidacy for a knee replacement.  She was advised that steroid injections would have diminishing benefit after repeated injections and that steroid injections could cause degradation of the small amount of cartilage she has left.  Congratulated on weight loss and encouraged to continue weight loss for arthritis pain relief.  Knee Injection: Written and verbal consent was obtained after discussing the risks and benefits of the procedure with the patient. The anterior right knee was cleansed in a sterile fashion with  betadine. 40 mg depo medrol and 3 cc 1% Lidocaine was injected using an anterolateral approach using a 5 cc syringe and 25 gauge needle. No complications were encountered. Minimal blood loss. A band aid was applied.      LOW BACK PAIN, CHRONIC Refilled patient's Robaxin today.  Advised to continue using her heating pad for symptomatic relief.    Lezlie Octave, M.D. 12/11/2016, 2:33 PM PGY-1, [facility]

## 2016-12-11 NOTE — Assessment & Plan Note (Signed)
Refilled patient's Robaxin today.  Advised to continue using her heating pad for symptomatic relief.

## 2016-12-11 NOTE — Assessment & Plan Note (Signed)
>>  ASSESSMENT AND PLAN FOR KNEE PAIN, RIGHT, CHRONIC WRITTEN ON 12/11/2016  2:33 PM BY WINFREY, AMANDA C, MD  Pain due to chronic osteoarthritis.  Given steroid injection in office today.  Referred to [facility] for reevaluation of candidacy for a knee replacement.  She was advised that steroid injections would have diminishing benefit after repeated injections and that steroid injections could cause degradation of the small amount of cartilage she has left.  Congratulated on weight loss and encouraged to continue weight loss for arthritis pain relief.  Knee Injection: Written and verbal consent was obtained after discussing the risks and benefits of the procedure with the patient. The anterior right knee was cleansed in a sterile fashion with betadine. 40 mg depo medrol and 3 cc 1% Lidocaine was injected using an anterolateral approach using a 5 cc syringe and 25 gauge needle. No complications were encountered. Minimal blood loss. A band aid was applied.

## 2016-12-12 ENCOUNTER — Other Ambulatory Visit: Payer: Self-pay | Admitting: *Deleted

## 2016-12-12 DIAGNOSIS — M545 Low back pain, unspecified: Secondary | ICD-10-CM

## 2016-12-12 DIAGNOSIS — G8929 Other chronic pain: Secondary | ICD-10-CM

## 2016-12-12 MED ORDER — CYCLOBENZAPRINE HCL 10 MG PO TABS: 10.0000 mg | ORAL_TABLET | Freq: Three times a day (TID) | ORAL | 1 refills | Status: DC | PRN

## 2016-12-12 NOTE — Telephone Encounter (Signed)
I asked the patient at her appointment which muscle relaxer she wanted refilled since she had two active prescriptions on the chart.  She did not know if the medication was Flexeril or Methocarbamol, but said that the pill had the letter "M" on it, so I assumed it was methocarbamol, but I see that it may have been flexeril instead.  I have now refilled the flexeril for her.  I am working nights, so I will try to call her when I get off of my shift tomorrow.  Thanks.

## 2016-12-12 NOTE — Telephone Encounter (Signed)
Patient was seen in office yesterday by Dr. Frances Furbish and had joint injection.  States she was given methocarbamol yesterday.  Took one dose and it is not helping.  States she will not be taking anymore of the med and left it here in the office.  Requesting refill of Flexeril.  Will route request to Dr. Frances Furbish and call patient back with status of refill request. Altamese Dilling, BSN, RN-BC

## 2016-12-12 NOTE — Telephone Encounter (Signed)
Pt is calling because she still has not heard anything form the doctor about the change in the medication. She is upset and doesn't understand why this is taking so long. Please call patient. jw

## 2017-02-06 ENCOUNTER — Other Ambulatory Visit: Payer: Self-pay | Admitting: Family Medicine

## 2017-02-11 ENCOUNTER — Ambulatory Visit (INDEPENDENT_AMBULATORY_CARE_PROVIDER_SITE_OTHER): Payer: Medicaid Other | Admitting: Family Medicine

## 2017-02-11 ENCOUNTER — Encounter: Payer: Self-pay | Admitting: Family Medicine

## 2017-02-11 ENCOUNTER — Other Ambulatory Visit: Payer: Self-pay

## 2017-02-11 DIAGNOSIS — L732 Hidradenitis suppurativa: Secondary | ICD-10-CM

## 2017-02-11 MED ORDER — DOXYCYCLINE HYCLATE 100 MG PO CAPS: 100.0000 mg | ORAL_CAPSULE | Freq: Two times a day (BID) | ORAL | 1 refills | Status: DC

## 2017-02-11 NOTE — Assessment & Plan Note (Addendum)
Patient has history of recurrent flareups requiring courses of antibiotics.  Most recently she was treated in June for 1 week of doxycycline.  Today she has an area of actively draining boils in her right axilla as well as a healing boil in her right groin.  Reassuringly afebrile and no systemic symptoms.  No signs of cellulitis.  The plan is as follows; -Begin doxycycline 100 mg twice daily for a prolonged course given history of hidradenitis, will start off with a 7759-month course and she will follow-up with PCP - May consider referral to dermatology however I will defer this to PCP -Continue with proper skin hygiene using unscented gentle soaps and avoiding fragrances -Encourage smoking cessation to help wound healing -Red flag symptoms discussed

## 2017-02-11 NOTE — Patient Instructions (Signed)
Thank you for coming in today, it was so nice to see you! Today we talked about:    Hidradenitis suppurativa "HS": You received a prescription for doxycycline and we will continue this for the next couple months  Please come back to the clinic if you have any fever, chills, nausea, vomiting, worsening skin pain  Please follow up in 2 months. You can schedule this appointment at the front desk before you leave or call the clinic.  Bring in all your medications or supplements to each appointment for review.   If we ordered any tests today, you will be notified via telephone of any abnormalities. If everything is normal you will get a letter in the mail.   If you have any questions or concerns, please do not hesitate to call the office at [phone]. You can also message me directly via MyChart.   Sincerely,  Anders Simmondshristina Blayke Cordrey, MD

## 2017-02-11 NOTE — Progress Notes (Signed)
Subjective:    Patient ID: [name] , female   DOB: 05/18/1965 , 51 y.o..   MRN: [id number]  HPI  [name] is here for same-day visit for for  Chief Complaint  Patient presents with  . Recurrent Skin Infections    1. Boils: Patient states that she has had boils since she was 51 years old.  She notes that she gets them frequently in her armpits in her groin and her "butt crack".  Patient notes that most recently she had a flareup of her boils in her right armpit and right groin that has been there for the last week.  Yesterday it started to drain and become less painful.  She denies any fevers, chills, nausea, vomiting, diarrhea.  She notes that she has taken antibiotics several times in the past for this problem.  Is against incision and drainage at this time and just wants antibiotics that she knows that this is helped her before.  Review of Systems: Per HPI.   Past Medical History: Patient Active Problem List   Diagnosis Date Noted  . Loss of balance 08/08/2014  . Peripheral neuropathy 08/08/2014  . Acute periapical abscess 05/19/2014  . Hidradenitis suppurativa 12/07/2013  . Mouth pain 05/18/2012  . GERD (gastroesophageal reflux disease) 04/08/2012  . Headache(784.0) 04/08/2012  . Osteoarthritis of knee 03/05/2012  . Arthritis of knee, degenerative 03/05/2012  . 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 SHOULDER PAIN, RIGHT 04/28/2009  . ELBOW PAIN, RIGHT 10/03/2008  . TOBACCO ABUSE 04/13/2008  . KNEE PAIN, RIGHT, CHRONIC 04/01/2008  . IRREGULAR MENSES 11/03/2007  . ANXIETY STATE NOS 02/10/2007  . DISORDER, BIPOLAR NOS 10/29/2006  . VARICOSE VEINS, LOWER EXTREMITIES 06/30/2006  . LOW BACK PAIN, CHRONIC 06/30/2006  . INSOMNIA 06/30/2006    Medications: reviewed   Social Hx:  reports that she has been smoking cigarettes.  She has been smoking about 0.00 packs per day. she has never used smokeless tobacco.   Objective:   BP 110/76   Pulse 78   Temp 98.1 F  (36.7 C) (Oral)   Wt 218 lb (98.9 kg)   LMP 01/23/2017 (Exact Date)   SpO2 98%   BMI 27.25 kg/m  Physical Exam  Gen: NAD, alert, cooperative with exam, well-appearing Skin: Right axilla showing a 3x 3cm area of induration and actively draining boil, no surrounding erythema or fluctuance.  Right groin showing healing boil with no drainage, induration, fluctuance. Psych: good insight, normal mood and affect  Assessment & Plan:  Hidradenitis suppurativa Patient has history of recurrent flareups requiring courses of antibiotics.  Most recently she was treated in June for 1 week of doxycycline.  Today she has an area of actively draining boils in her right axilla as well as a healing boil in her right groin.  Reassuringly afebrile and no systemic symptoms.  No signs of cellulitis.  The plan is as follows; -Begin doxycycline 100 mg twice daily for a prolonged course given history of hidradenitis, will start off with a 625-month course and she will follow-up with PCP - May consider referral to dermatology however I will defer this to PCP -Continue with proper skin hygiene using unscented gentle soaps and avoiding fragrances -Encourage smoking cessation to help wound healing -Red flag symptoms discussed  Meds ordered this encounter  Medications  . doxycycline (VIBRAMYCIN) 100 MG capsule    Sig: Take 1 capsule (100 mg total) by mouth 2 (two) times daily.    Dispense:  60 capsule  Refill:  1    Smitty Cords, MD [place], PGY-3

## 2017-02-24 ENCOUNTER — Ambulatory Visit: Payer: Medicaid Other | Admitting: Family Medicine

## 2017-03-26 ENCOUNTER — Telehealth: Payer: Self-pay | Admitting: *Deleted

## 2017-03-26 NOTE — Telephone Encounter (Signed)
Patient left message on nurse line requesting change in antibiotic as current one is making her nauseated. [name]. [name], RN, BSN

## 2017-03-27 NOTE — Telephone Encounter (Signed)
It looks like doxycycline is the best treatment for hydradenitis, but if she cannot tolerate it, I think she could try topical 1% clindamycin.  I didn't see her for this complaint though, so I won't prescribe any alternative right now.  What do you think [name]?

## 2017-03-28 MED ORDER — CLINDAMYCIN PHOSPHATE 1 % EX SOLN: Freq: Two times a day (BID) | CUTANEOUS | 2 refills | Status: DC

## 2017-03-28 NOTE — Telephone Encounter (Signed)
Called patient. Patient states that she has been nauseated since taking the Doxycycline. She has tried taking it with and without food and nothing seems to help. Agree with Dr. Frances FurbishWinfrey that we can try topical Clindamycin instead of PO Doxy and see if that helps. Patient was agreeable to this. Rx for Clindamycin gel sent to pharmacy.   Patient also stated on the phone that something like a bug came out of her arm and she has been saving it to bring to clinic at her next appointment.  Anders Simmondshristina Andrew Blasius, MD [facility], PGY-3

## 2017-04-02 ENCOUNTER — Ambulatory Visit: Payer: Medicaid Other | Admitting: Family Medicine

## 2017-04-09 ENCOUNTER — Ambulatory Visit (INDEPENDENT_AMBULATORY_CARE_PROVIDER_SITE_OTHER): Payer: Medicaid Other | Admitting: Student in an Organized Health Care Education/Training Program

## 2017-04-09 ENCOUNTER — Other Ambulatory Visit: Payer: Self-pay

## 2017-04-09 ENCOUNTER — Encounter: Payer: Self-pay | Admitting: Student in an Organized Health Care Education/Training Program

## 2017-04-09 VITALS — BP 140/80 | HR 81 | Temp 97.9°F | Ht 75.0 in | Wt 220.8 lb

## 2017-04-09 DIAGNOSIS — L732 Hidradenitis suppurativa: Secondary | ICD-10-CM | POA: Diagnosis not present

## 2017-04-09 DIAGNOSIS — M545 Low back pain, unspecified: Secondary | ICD-10-CM

## 2017-04-09 DIAGNOSIS — G8929 Other chronic pain: Secondary | ICD-10-CM | POA: Diagnosis not present

## 2017-04-09 MED ORDER — SULFAMETHOXAZOLE-TRIMETHOPRIM 800-160 MG PO TABS: 1.0000 | ORAL_TABLET | Freq: Two times a day (BID) | ORAL | 0 refills | Status: DC

## 2017-04-09 MED ORDER — CYCLOBENZAPRINE HCL 10 MG PO TABS: 10.0000 mg | ORAL_TABLET | Freq: Two times a day (BID) | ORAL | 0 refills | Status: DC | PRN

## 2017-04-09 NOTE — Assessment & Plan Note (Signed)
Chronic low back pain that presents in flares. No red flags on exam or by history today. Sent Flexeril as this is worse for the patient in the past.

## 2017-04-09 NOTE — Patient Instructions (Addendum)
It was a pleasure seeing you today in our clinic. Today we discussed Hidradenitis Supportiva. Here is the treatment plan we have discussed and agreed upon together:  I sent antibiotics to your pharmacy  A consult was placed to Surgery at today's visit.  You will receive a call to schedule an appointment. If you do not receive a call within two weeks please call our office so we can place the consult again.  Our clinic's number is [phone]. Please call with questions or concerns about what we discussed today.  Be well, Dr. Mosetta PuttFeng

## 2017-04-09 NOTE — Assessment & Plan Note (Signed)
Discussed with patient that long-term antibiotics may not prevent her boils from recurring. We can go ahead and order TMP-SMX to see if she tolerates this medication better than the doxycycline. I did not order clindamycin be out of concern for having her on broad-spectrum antibiotics for so long putting her at higher risk for C. difficile colitis. The curative management would be surgery. Discussed this with the patient. She is agreeable to a surgical consultation which was placed today.

## 2017-04-09 NOTE — Progress Notes (Signed)
CC: Hidradenitis supportiva  HPI: [name] is a 52 y.o. female with PMH significant for hidradenitis supportiva who presents to [facility] today for follow-up of chronic issues.   Hidradinitis Patient has been to our office for this problem multiple times in the past. She frequently has boils that flare in her axillary region as well as her groin area. She was previously started on doxycycline by her PCP, however she was unable to tolerate this medication because it aches her nauseous and vomit. She was subsequently started on clindamycin gel, which has not helped and she is seeing a difference. She is interested in another medication that may help prevent boils from occurring. He has had no recent fevers. She has seen no recent redness, drainage, warmth of her axillary region where she has this issue. Patient reports seeing a white, hard stone, out of one of her axillary boils. She brought in the stone for [place] to look at under the microscope.  Back Pain Patient endorses chronic back pain that is intermittent, comes in exacerbations. Cold is an aggravating factor. She uses heating pads, and has been given Flexeril the past which has helped when the back pain comes on. She denies leg weakness. She denies fecal or urinary incontinence. She denies saddle anesthesias.   Review of Symptoms:  See HPI for ROS.   CC, SH/smoking status, and VS noted.  Objective: BP 140/80   Pulse 81   Temp 97.9 F (36.6 C) (Oral)   Ht 6\' 3"  (1.905 m)   Wt 220 lb 12.8 oz (100.2 kg)   LMP 04/06/2017 (Exact Date)   SpO2 99%   BMI 27.60 kg/m  GEN: NAD, alert, cooperative, and pleasant. SKIN: Left axilla has an approximately 2 cm by 1 cm area of well-demarcated induration. No redness, warmth, drainage. Left axilla additionally has an approx 2 cm by 1 cm area of well-demarcated induration without redness warmth or drainage. BACK:  Inspection: Unremarkable  Palpable tenderness: +mild ttp in lumbar region Range  of Motion:  Flexion 45 deg; Extension 45 deg; Side Bending to 45 deg bilaterally; Rotation to 45 deg bilaterally  Leg strength: Quad: 5/5 Hip flexor: 5/5 Hip abductors: 5/5  Sensory change: Gross sensation intact to all lumbar and sacral dermatomes.  Gait unremarkable.  Specimen Stone was viewed under the microscope at patient's request. White and black, crystalized, consistent with a sebum rock  Assessment and plan:  Hidradenitis suppurativa Discussed with patient that long-term antibiotics may not prevent her boils from recurring. We can go ahead and order TMP-SMX to see if she tolerates this medication better than the doxycycline. I did not order clindamycin be out of concern for having her on broad-spectrum antibiotics for so long putting her at higher risk for C. difficile colitis. The curative management would be surgery. Discussed this with the patient. She is agreeable to a surgical consultation which was placed today.  LOW BACK PAIN, CHRONIC Chronic low back pain that presents in flares. No red flags on exam or by history today. Sent Flexeril as this is worse for the patient in the past.   Orders Placed This Encounter  Procedures  . Ambulatory referral to General Surgery    Referral Priority:   Routine    Referral Type:   Surgical    Referral Reason:   Specialty Services Required    Requested Specialty:   General Surgery    Number of Visits Requested:   1    Meds ordered this encounter  Medications  . sulfamethoxazole-trimethoprim (BACTRIM DS,SEPTRA DS) 800-160 MG tablet    Sig: Take 1 tablet by mouth 2 (two) times daily.    Dispense:  60 tablet    Refill:  0  . cyclobenzaprine (FLEXERIL) 10 MG tablet    Sig: Take 1 tablet (10 mg total) by mouth 2 (two) times daily as needed for muscle spasms.    Dispense:  30 tablet    Refill:  0    Howard PouchLauren Riann Oman, MD,MS,  PGY2 04/09/2017 12:01 PM

## 2017-04-24 ENCOUNTER — Other Ambulatory Visit: Payer: Self-pay

## 2017-04-24 ENCOUNTER — Ambulatory Visit (INDEPENDENT_AMBULATORY_CARE_PROVIDER_SITE_OTHER): Payer: Medicaid Other | Admitting: Student

## 2017-04-24 ENCOUNTER — Encounter: Payer: Self-pay | Admitting: Student

## 2017-04-24 VITALS — BP 128/82 | HR 64 | Temp 97.8°F | Ht 75.0 in | Wt 217.0 lb

## 2017-04-24 DIAGNOSIS — M1711 Unilateral primary osteoarthritis, right knee: Secondary | ICD-10-CM

## 2017-04-24 MED ORDER — METHYLPREDNISOLONE ACETATE 40 MG/ML IJ SUSP
40.0000 mg | Freq: Once | INTRAMUSCULAR | Status: AC
  Administered 2017-04-24: 40 mg via INTRAMUSCULAR

## 2017-04-24 NOTE — Progress Notes (Signed)
Subjective:    [name] is a 52 y.o. old female here right knee injection.  HPI Right knee injection: Patient with history of right knee osteoarthritis.  This is a chronic issue.  She has been getting steroid injection almost every 3 months.  She states the last injection was about 4 months ago.  She reports good response to steroid injection.  She denies overlying skin erythema or fever.  She reports mild swelling.  Denies history of gout.  She is not on blood thinners.  PMH/Problem List: has DISORDER, BIPOLAR NOS; ANXIETY STATE NOS; TOBACCO ABUSE; VARICOSE VEINS, LOWER EXTREMITIES; IRREGULAR MENSES; SHOULDER PAIN, RIGHT; ELBOW PAIN, RIGHT; KNEE PAIN, RIGHT, CHRONIC; LOW BACK PAIN, CHRONIC; INSOMNIA; GERD (gastroesophageal reflux disease); Headache(784.0); Mouth pain; Osteoarthritis of knee; Plica syndrome; Arthritis of knee, degenerative; Hidradenitis suppurativa; Acute periapical abscess; Loss of balance; and Peripheral neuropathy on their problem list.   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  FH:  Family History  Problem Relation Age of Onset  . Hypertension Mother   . Diabetes Mother   . Diabetes Father   . Hypertension Father     SH Social History   Tobacco Use  . Smoking status: Current Every Day Smoker    Packs/day: 0.00    Types: Cigarettes  . Smokeless tobacco: Never Used  Substance Use Topics  . Alcohol use: Yes  . Drug use: Yes    Types: Marijuana    Review of Systems Review of systems negative except for pertinent positives and negatives in history of present illness above.     Objective:     Vitals:   04/24/17 0952    BP: 128/82  Pulse: 64  Temp: 97.8 F (36.6 C)  TempSrc: Oral  SpO2: 99%  Weight: 217 lb (98.4 kg)  Height: 6\' 3"  (1.905 m)   Body mass index is 27.12 kg/m.  Physical Exam  GEN: appears well, no apparent distress. RESP: no IWOB MSK:  Right knee Normal to inspection with no erythema or obvious bony abnormalities. Mild notable effusion over the later aspect No warmth to touch. Has joint line tenderness, ROM full in flexion and extension and lower leg rotation. Patellar glide with crepitus. Ligaments with solid consistent endpoints including ACL, PCL, LCL, MCL. Non painful patellar compression.  Normal sensation in right leg, foot.   Neurovascularly intact with good distal pulses.  SKIN: no apparent skin lesion NEURO: alert and oiented appropriately, no gross deficits  PSYCH: euthymic mood with congruent affect     Assessment and Plan:  1. Osteoarthritis of right knee, unspecified osteoarthritis type: stable. No signs of infection. Steroid injection today. See procedure not below for more information. - methylPREDNISolone acetate (DEPO-MEDROL) injection 40 mg  Procedure:   Steroid injection of Left knee Consent obtained and verified. Noted no overlying erythema, induration, or other signs of local infection.  Patient not on anticoagulants. Approach: anterolateral Skin prepped in a sterile fashion. Topical analgesic spray: Ethyl chloride. Injection completed without difficulty with Solumedrol 40 mg (1cc) & Lidocaine 1% (4cc) Pain immediately improved suggesting accurate placement of the medication. Advised to call if fevers/chills, erythema, induration, drainage, or persistent bleeding.   Return if symptoms worsen or fail to improve.  Almon Herculesaye T Aniqa Hare, MD 04/24/17 Pager: [phone]

## 2017-04-24 NOTE — Patient Instructions (Signed)
It was great seeing you today! We have addressed the following issues today  Right knee pain: We gave you steroid injection today.  Please let us know if you have severe pain, swelling, redness, fever or any other symptoms concerning to you.  You can try Aleve or ibuprofen as needed for pain for the next 48 hours.  If we did any lab work today, and the results require attention, either me or my nurse will get in touch with you. If everything is normal, you will get a letter in mail and a message via . If you don't hear from us in two weeks, please give us a call. Otherwise, we look forward to seeing you again at your next visit. If you have any questions or concerns before then, please call the clinic at [phone].  Please bring all your medications to every doctors visit  Sign up for My Chart to have easy access to your labs results, and communication with your Primary care physician.    Please check-out at the front desk before leaving the clinic.    Take Care,   Dr. Alanda SlimGonfa

## 2017-05-19 ENCOUNTER — Telehealth: Payer: Self-pay

## 2017-05-19 NOTE — Telephone Encounter (Signed)
Patient left message on nurse line requesting a refill on Amoxicillin for a dental infection. States she is not feeling well and is unable to come for an appt. Ples SpecterAlisa Demarri Elie, RN [facility] RN)

## 2017-05-20 ENCOUNTER — Other Ambulatory Visit: Payer: Self-pay | Admitting: Family Medicine

## 2017-05-20 MED ORDER — AMOXICILLIN 875 MG PO TABS: 875.0000 mg | ORAL_TABLET | Freq: Two times a day (BID) | ORAL | 0 refills | Status: DC

## 2017-05-28 ENCOUNTER — Other Ambulatory Visit: Payer: Self-pay | Admitting: Family Medicine

## 2017-06-25 ENCOUNTER — Telehealth: Payer: Self-pay

## 2017-06-25 NOTE — Telephone Encounter (Signed)
Patient left message that her boils are coming back and requests an [facility] to be sent[facility] Ples SpecterAlisa Brake, RN [facility] RN)

## 2017-06-26 ENCOUNTER — Other Ambulatory Visit: Payer: Self-pay | Admitting: Family Medicine

## 2017-06-26 DIAGNOSIS — L732 Hidradenitis suppurativa: Secondary | ICD-10-CM

## 2017-06-26 MED ORDER — SULFAMETHOXAZOLE-TRIMETHOPRIM 800-160 MG PO TABS: 1.0000 | ORAL_TABLET | Freq: Two times a day (BID) | ORAL | 0 refills | Status: DC

## 2017-06-26 NOTE — Telephone Encounter (Signed)
I will prescribe [name] a two-week course of Bactrim.  If her symptoms persist past two weeks, she will need to be reevaluated before any more antibiotics are prescribed.

## 2017-06-26 NOTE — Telephone Encounter (Signed)
LVM for pt to call back to inform her of below.  Please give her the message if she calls back. Lamonte SakaiZimmerman Rumple, Assata Juncaj D, [place]

## 2017-07-21 ENCOUNTER — Telehealth: Payer: Self-pay | Admitting: Family Medicine

## 2017-07-21 NOTE — Telephone Encounter (Signed)
Needs refills on her muscle relaxer and antibotic for her boils.  She is in tremendous pain/  [facility] [address]

## 2017-07-22 ENCOUNTER — Other Ambulatory Visit: Payer: Self-pay | Admitting: Family Medicine

## 2017-07-22 MED ORDER — CYCLOBENZAPRINE HCL 10 MG PO TABS: 10.0000 mg | ORAL_TABLET | Freq: Two times a day (BID) | ORAL | 3 refills | Status: DC | PRN

## 2017-07-22 NOTE — Telephone Encounter (Signed)
Pt called a second time requesting these refills. Pt says she's in a lot of pain.

## 2017-07-22 NOTE — Telephone Encounter (Signed)
Pt informed of below and appointment scheduled for 07/24/17.  Routing to PCP as an Financial planner. Lamonte Sakai, Gordy Goar D, [place]

## 2017-07-22 NOTE — Telephone Encounter (Signed)
I refilled patient's Bactrim the last time she called - I saw that we had to leave a message to let her know about this, maybe she did not get the message.  She should check with her pharmacy to see if the medication is still there, and if not, I will refill it.  If she needs further refills, she should come in to the clinic to be seen.    I will refill her Flexeril.

## 2017-07-24 ENCOUNTER — Ambulatory Visit: Payer: Medicaid Other | Admitting: Family Medicine

## 2017-09-18 ENCOUNTER — Ambulatory Visit (INDEPENDENT_AMBULATORY_CARE_PROVIDER_SITE_OTHER): Payer: Medicaid Other | Admitting: Student

## 2017-09-18 ENCOUNTER — Other Ambulatory Visit: Payer: Self-pay

## 2017-09-18 ENCOUNTER — Encounter: Payer: Self-pay | Admitting: Student

## 2017-09-18 VITALS — BP 134/84 | HR 77 | Temp 98.0°F | Wt 228.0 lb

## 2017-09-18 DIAGNOSIS — G8929 Other chronic pain: Secondary | ICD-10-CM

## 2017-09-18 DIAGNOSIS — L732 Hidradenitis suppurativa: Secondary | ICD-10-CM

## 2017-09-18 DIAGNOSIS — M25561 Pain in right knee: Secondary | ICD-10-CM | POA: Diagnosis present

## 2017-09-18 MED ORDER — METHYLPREDNISOLONE ACETATE 40 MG/ML IJ SUSP
40.0000 mg | Freq: Once | INTRAMUSCULAR | Status: AC
  Administered 2017-09-18: 40 mg via INTRA_ARTICULAR

## 2017-09-18 MED ORDER — SULFAMETHOXAZOLE-TRIMETHOPRIM 800-160 MG PO TABS: 1.0000 | ORAL_TABLET | Freq: Two times a day (BID) | ORAL | 0 refills | Status: DC

## 2017-09-18 NOTE — Patient Instructions (Signed)
It was great seeing you today! We have addressed the following issues today  Knee pain: We gave you a steroid injection today.  Apply ice as needed for the next 24 hours.  Boils: We sent a prescription for antibiotic to your pharmacy.    If we did any lab work today, and the results require attention, either me or my nurse will get in touch with you. If everything is normal, you will get a letter in mail and a message via . If you don't hear from us in two weeks, please give[place] us a call. Otherwise, we look forward to seeing you again at your next visit. If you have any questions or concerns before then, please call the clinic at [phone].  Please bring all your medications to every doctors visit  Sign up for My Chart to have easy access to your labs results, and communication with your Primary care physician.    Please check-out at the front desk before leaving the clinic.    Take Care,   Dr. Alanda SlimGonfa

## 2017-09-18 NOTE — Progress Notes (Signed)
Subjective:    [name] is a 52 y.o. old female here for steroid injection for right knee pain.  HPI Right knee pain: Known history of osteoarthritis.  Had a steroid injection about 6 months ago.  Reports some swelling but denies fever, chills or overlying skin erythema.  No history of gout.  No history of trauma or injury.  Boils: Chronic issue.  Bilateral in both armpits and inguinal areas.  She has history of hidradenitis suppurativa.  She reports recurrent swelling and  drainage from inguinal area that resolves with antibiotic. No constitutional symptoms.  She asks for refill on her Bactrim DS.  PMH/Problem List: has DISORDER, BIPOLAR NOS; ANXIETY STATE NOS; TOBACCO ABUSE; VARICOSE VEINS, LOWER EXTREMITIES; IRREGULAR MENSES; SHOULDER PAIN, RIGHT; ELBOW PAIN, RIGHT; KNEE PAIN, RIGHT, CHRONIC; LOW BACK PAIN, CHRONIC; INSOMNIA; GERD (gastroesophageal reflux disease); Headache(784.0); Mouth pain; Osteoarthritis of knee; Plica syndrome; Arthritis of knee, degenerative; Hidradenitis suppurativa; Acute periapical abscess; Loss of balance; and Peripheral neuropathy on their problem list.   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  FH:  Family History  Problem Relation Age of Onset  . Hypertension Mother   . Diabetes Mother   . Diabetes Father   . Hypertension Father     SH Social History   Tobacco Use  . Smoking status: Current Every Day Smoker    Packs/day: 0.00    Types: Cigarettes  . Smokeless tobacco: Never Used  Substance Use Topics  . Alcohol use: Yes  . Drug use: Yes    Types: Marijuana    Review of Systems Review of systems negative  except for pertinent positives and negatives in history of present illness above.     Objective:     Vitals:   09/18/17 1428  BP: 134/84  Pulse: 77  Temp: 98 F (36.7 C)  TempSrc: Oral  SpO2: 98%  Weight: 228 lb (103.4 kg)   Body mass index is 28.5 kg/m.  Physical Exam  GEN: appears well & comfortable. No apparent distress. Head: normocephalic and atraumatic  Hem: Bilateral axillary lymphadenopathy without lymphadenitis.  No overlying skin erythema.  MSK: Right knee Mild swelling.  No erythema or increased warmth to touch. Has mild joint line tenderness, ROM full in flexion and extension and lower leg rotation. Patellar glide with crepitus. Ligaments with solid consistent endpoints including ACL, PCL, LCL, MCL.  Normal sensation in right leg, foot.   Neurovascularly intact with good distal pulses.  NEURO: alert and oiented appropriately, no gross deficits   PSYCH: euthymic mood with congruent affect   Procedure:   Steroid injection of Left knee Consent obtained and verified. Noted no overlying erythema, induration, or other signs of local infection.  Patient not on anticoagulants. Approach: anterolateral Skin prepped in a sterile fashion. Topical analgesic spray: Ethyl chloride. Injection completed without difficulty with Solumedrol 40 mg (1cc) & Lidocaine 1% (4cc) Pain immediately improved suggesting accurate placement of the medication. Advised to call if fevers/chills, erythema, induration, drainage, or persistent bleeding.       Assessment and Plan:  1. Chronic pain of right knee: osteoarthritis.  No erythema increased warmth to touch.  No signs suspicious for infectious etiology. - methylPREDNISolone acetate (DEPO-MEDROL) injection 40 mg  2. Hidradenitis suppurativa: Chronic issue.  Refilled her Bactrim  DS - sulfamethoxazole-trimethoprim (BACTRIM DS,SEPTRA DS) 800-160 MG tablet; Take 1 tablet by mouth 2 (two) times daily.  Dispense: 28 tablet; Refill:  0  Return if symptoms worsen or fail to improve.  Almon Herculesaye T Zakaiya Lares, MD 09/18/17 Pager: [phone]

## 2017-09-19 ENCOUNTER — Other Ambulatory Visit: Payer: Self-pay | Admitting: Family Medicine

## 2017-09-22 DIAGNOSIS — M25569 Pain in unspecified knee: Secondary | ICD-10-CM | POA: Diagnosis not present

## 2017-09-23 DIAGNOSIS — M25569 Pain in unspecified knee: Secondary | ICD-10-CM | POA: Diagnosis not present

## 2017-09-24 DIAGNOSIS — F3132 Bipolar disorder, current episode depressed, moderate: Secondary | ICD-10-CM | POA: Diagnosis not present

## 2017-09-24 DIAGNOSIS — M25569 Pain in unspecified knee: Secondary | ICD-10-CM | POA: Diagnosis not present

## 2017-09-25 DIAGNOSIS — M25569 Pain in unspecified knee: Secondary | ICD-10-CM | POA: Diagnosis not present

## 2017-09-26 DIAGNOSIS — M25569 Pain in unspecified knee: Secondary | ICD-10-CM | POA: Diagnosis not present

## 2017-09-27 DIAGNOSIS — M25569 Pain in unspecified knee: Secondary | ICD-10-CM | POA: Diagnosis not present

## 2017-09-28 DIAGNOSIS — M25569 Pain in unspecified knee: Secondary | ICD-10-CM | POA: Diagnosis not present

## 2017-09-29 DIAGNOSIS — M25569 Pain in unspecified knee: Secondary | ICD-10-CM | POA: Diagnosis not present

## 2017-09-30 DIAGNOSIS — M25569 Pain in unspecified knee: Secondary | ICD-10-CM | POA: Diagnosis not present

## 2017-10-01 DIAGNOSIS — M25569 Pain in unspecified knee: Secondary | ICD-10-CM | POA: Diagnosis not present

## 2017-10-02 ENCOUNTER — Other Ambulatory Visit: Payer: Self-pay | Admitting: Family Medicine

## 2017-10-02 DIAGNOSIS — M25569 Pain in unspecified knee: Secondary | ICD-10-CM | POA: Diagnosis not present

## 2017-10-03 DIAGNOSIS — M25569 Pain in unspecified knee: Secondary | ICD-10-CM | POA: Diagnosis not present

## 2017-10-03 NOTE — Telephone Encounter (Signed)
2nd request

## 2017-10-03 NOTE — Telephone Encounter (Signed)
Pt calling 3rd time requesting this refill for her mouth. Please call pt to let her know when its been done. [facility] [address]

## 2017-10-03 NOTE — Telephone Encounter (Signed)
Patient needs an appointment in order to be prescribed antibiotics for her concern.  I do not see any recent notes mentioning an infection in her mouth, and I am not comfortable prescribing antibiotics for a concern that I am not familiar with and without seeing the patient.  Thank you.

## 2017-10-04 DIAGNOSIS — M25569 Pain in unspecified knee: Secondary | ICD-10-CM | POA: Diagnosis not present

## 2017-10-05 DIAGNOSIS — M25569 Pain in unspecified knee: Secondary | ICD-10-CM | POA: Diagnosis not present

## 2017-10-06 ENCOUNTER — Other Ambulatory Visit: Payer: Self-pay | Admitting: Family Medicine

## 2017-10-06 DIAGNOSIS — M25569 Pain in unspecified knee: Secondary | ICD-10-CM | POA: Diagnosis not present

## 2017-10-06 NOTE — Telephone Encounter (Signed)
Contacted pt and scheduled an appointment for 10/08/17 with Dr. Chanetta Marshallimberlake due to PCP not having availability until Aug. Lamonte SakaiZimmerman Rumple, Odessie Polzin D, [place]

## 2017-10-06 NOTE — Telephone Encounter (Signed)
Pt called and would like to know if she can have her Amoxicillin refilled for the sore in her mouth. I looked in her chart and did not see the medication listed but she said "the doctor knows what im talking about". I also sent her to the nurse line to speak with them about her refill. She asked that I sent a note as well.

## 2017-10-06 NOTE — Telephone Encounter (Signed)
Per PCP in refill note pt needed appointment.  Called and scheduled for 10/08/17 with Dr. Chanetta Marshallimberlake since PCP has no availaibility until Aug. Lamonte SakaiZimmerman Rumple, April D, CMA

## 2017-10-07 DIAGNOSIS — M25569 Pain in unspecified knee: Secondary | ICD-10-CM | POA: Diagnosis not present

## 2017-10-08 ENCOUNTER — Other Ambulatory Visit: Payer: Self-pay

## 2017-10-08 ENCOUNTER — Encounter: Payer: Self-pay | Admitting: Family Medicine

## 2017-10-08 ENCOUNTER — Ambulatory Visit (INDEPENDENT_AMBULATORY_CARE_PROVIDER_SITE_OTHER): Payer: Medicaid Other | Admitting: Family Medicine

## 2017-10-08 VITALS — BP 110/70 | HR 73 | Temp 98.2°F | Ht 75.0 in | Wt 226.0 lb

## 2017-10-08 DIAGNOSIS — M25569 Pain in unspecified knee: Secondary | ICD-10-CM | POA: Diagnosis not present

## 2017-10-08 DIAGNOSIS — K1379 Other lesions of oral mucosa: Secondary | ICD-10-CM | POA: Diagnosis present

## 2017-10-08 DIAGNOSIS — F3132 Bipolar disorder, current episode depressed, moderate: Secondary | ICD-10-CM | POA: Diagnosis not present

## 2017-10-08 MED ORDER — AMOXICILLIN 875 MG PO TABS: 875.0000 mg | ORAL_TABLET | Freq: Two times a day (BID) | ORAL | 0 refills | Status: DC

## 2017-10-08 NOTE — Patient Instructions (Signed)
It was a pleasure to see you today! Thank you for choosing [facility] for your primary care. Kendall FlackSharon L Symmonds was seen for mouth pain.   Our plans for today were:  I have sent the antibiotic. I have placed the referral to the oral surgeon.  Please go to the emergency room if you have worsening pain, vision changes, or fever or other concerns.  Best,  Dr. Chanetta Marshallimberlake

## 2017-10-08 NOTE — Progress Notes (Signed)
   CC: dental pain  HPI  Tooth pain - 4 days. Hurting all the way up to her eye. Last saw a dentist never. Previous hx of trouble with this tooth before. Called to get a refill but needed to be seen. Previously did well with amox. No vision changes or eye pain. Wants referral to oral surgeon.  ROS: Denies CP, SOB, abdominal pain, dysuria, changes in BMs.   CC, SH/smoking status, and VS noted  Objective: BP 110/70   Pulse 73   Temp 98.2 F (36.8 C) (Oral)   Ht 6\' 3"  (1.905 m)   Wt 226 lb (102.5 kg)   SpO2 99%   BMI 28.25 kg/m  Gen: NAD, alert, cooperative, and pleasant. HEENT: NCAT, EOMI, PERRL. No facial swelling, premolars missing over L upper jaw, no pus or drainage, some edema with TTP.  CV: RRR, no murmur Resp: CTAB, no wheezes, non-labored  Ext: No edema, warm Neuro: Alert and oriented, Speech clear, No gross deficits  Assessment and plan:  1. Mouth pain Needs urgent dental follow up. Appears to come for antibiotics not infrequently. Encouraged dental followup, happy to refer to oral surgery although unsure if insurance will pay. Use amox until dental assessment, use APAP or ibuprofen for pain. Return if worsening swelling, pain, fever, troubleswallowing.   - Ambulatory referral to Oral Maxillofacial Surgery   Orders Placed This Encounter  Procedures  . Ambulatory referral to Oral Maxillofacial Surgery    Referral Priority:   Urgent    Referral Type:   Surgical    Referral Reason:   Specialty Services Required    Requested Specialty:   Oral Surgery    Number of Visits Requested:   1    Meds ordered this encounter  Medications  . amoxicillin (AMOXIL) 875 MG tablet    Sig: Take 1 tablet (875 mg total) by mouth 2 (two) times daily.    Dispense:  20 tablet    Refill:  0     Loni MuseKate Novalie Leamy, MD, PGY3 10/09/2017 9:51 AM

## 2017-10-09 DIAGNOSIS — M25569 Pain in unspecified knee: Secondary | ICD-10-CM | POA: Diagnosis not present

## 2017-10-10 DIAGNOSIS — M25569 Pain in unspecified knee: Secondary | ICD-10-CM | POA: Diagnosis not present

## 2017-10-11 DIAGNOSIS — M25569 Pain in unspecified knee: Secondary | ICD-10-CM | POA: Diagnosis not present

## 2017-10-12 DIAGNOSIS — M25569 Pain in unspecified knee: Secondary | ICD-10-CM | POA: Diagnosis not present

## 2017-10-13 DIAGNOSIS — M25569 Pain in unspecified knee: Secondary | ICD-10-CM | POA: Diagnosis not present

## 2017-10-14 DIAGNOSIS — M25569 Pain in unspecified knee: Secondary | ICD-10-CM | POA: Diagnosis not present

## 2017-10-15 DIAGNOSIS — M25569 Pain in unspecified knee: Secondary | ICD-10-CM | POA: Diagnosis not present

## 2017-10-16 DIAGNOSIS — M25569 Pain in unspecified knee: Secondary | ICD-10-CM | POA: Diagnosis not present

## 2017-10-17 DIAGNOSIS — M25569 Pain in unspecified knee: Secondary | ICD-10-CM | POA: Diagnosis not present

## 2017-10-18 DIAGNOSIS — M25569 Pain in unspecified knee: Secondary | ICD-10-CM | POA: Diagnosis not present

## 2017-10-19 DIAGNOSIS — M25569 Pain in unspecified knee: Secondary | ICD-10-CM | POA: Diagnosis not present

## 2017-10-20 DIAGNOSIS — M25569 Pain in unspecified knee: Secondary | ICD-10-CM | POA: Diagnosis not present

## 2017-10-21 DIAGNOSIS — M25569 Pain in unspecified knee: Secondary | ICD-10-CM | POA: Diagnosis not present

## 2017-10-22 DIAGNOSIS — M25569 Pain in unspecified knee: Secondary | ICD-10-CM | POA: Diagnosis not present

## 2017-10-23 DIAGNOSIS — M25569 Pain in unspecified knee: Secondary | ICD-10-CM | POA: Diagnosis not present

## 2017-10-24 DIAGNOSIS — F3132 Bipolar disorder, current episode depressed, moderate: Secondary | ICD-10-CM | POA: Diagnosis not present

## 2017-10-24 DIAGNOSIS — M25569 Pain in unspecified knee: Secondary | ICD-10-CM | POA: Diagnosis not present

## 2017-10-25 DIAGNOSIS — M25569 Pain in unspecified knee: Secondary | ICD-10-CM | POA: Diagnosis not present

## 2017-10-26 DIAGNOSIS — M25569 Pain in unspecified knee: Secondary | ICD-10-CM | POA: Diagnosis not present

## 2017-10-27 DIAGNOSIS — M25569 Pain in unspecified knee: Secondary | ICD-10-CM | POA: Diagnosis not present

## 2017-10-28 DIAGNOSIS — M25569 Pain in unspecified knee: Secondary | ICD-10-CM | POA: Diagnosis not present

## 2017-10-29 DIAGNOSIS — M25569 Pain in unspecified knee: Secondary | ICD-10-CM | POA: Diagnosis not present

## 2017-10-29 DIAGNOSIS — F3132 Bipolar disorder, current episode depressed, moderate: Secondary | ICD-10-CM | POA: Diagnosis not present

## 2017-10-30 DIAGNOSIS — M25569 Pain in unspecified knee: Secondary | ICD-10-CM | POA: Diagnosis not present

## 2017-10-31 DIAGNOSIS — M25569 Pain in unspecified knee: Secondary | ICD-10-CM | POA: Diagnosis not present

## 2017-11-01 DIAGNOSIS — M25569 Pain in unspecified knee: Secondary | ICD-10-CM | POA: Diagnosis not present

## 2017-11-02 DIAGNOSIS — M25569 Pain in unspecified knee: Secondary | ICD-10-CM | POA: Diagnosis not present

## 2017-11-03 DIAGNOSIS — M25569 Pain in unspecified knee: Secondary | ICD-10-CM | POA: Diagnosis not present

## 2017-11-04 DIAGNOSIS — M25569 Pain in unspecified knee: Secondary | ICD-10-CM | POA: Diagnosis not present

## 2017-11-05 DIAGNOSIS — M25569 Pain in unspecified knee: Secondary | ICD-10-CM | POA: Diagnosis not present

## 2017-11-06 DIAGNOSIS — M25569 Pain in unspecified knee: Secondary | ICD-10-CM | POA: Diagnosis not present

## 2017-11-07 DIAGNOSIS — M25569 Pain in unspecified knee: Secondary | ICD-10-CM | POA: Diagnosis not present

## 2017-11-08 DIAGNOSIS — M25569 Pain in unspecified knee: Secondary | ICD-10-CM | POA: Diagnosis not present

## 2017-11-09 DIAGNOSIS — M25569 Pain in unspecified knee: Secondary | ICD-10-CM | POA: Diagnosis not present

## 2017-11-10 ENCOUNTER — Encounter: Payer: Self-pay | Admitting: Family Medicine

## 2017-11-10 ENCOUNTER — Other Ambulatory Visit: Payer: Self-pay

## 2017-11-10 ENCOUNTER — Ambulatory Visit (INDEPENDENT_AMBULATORY_CARE_PROVIDER_SITE_OTHER): Payer: Medicaid Other | Admitting: Family Medicine

## 2017-11-10 VITALS — BP 134/82 | HR 72 | Temp 98.8°F | Ht 75.0 in | Wt 231.2 lb

## 2017-11-10 DIAGNOSIS — F12980 Cannabis use, unspecified with anxiety disorder: Secondary | ICD-10-CM | POA: Diagnosis not present

## 2017-11-10 DIAGNOSIS — R05 Cough: Secondary | ICD-10-CM | POA: Diagnosis not present

## 2017-11-10 DIAGNOSIS — Z720 Tobacco use: Secondary | ICD-10-CM | POA: Diagnosis not present

## 2017-11-10 DIAGNOSIS — R0683 Snoring: Secondary | ICD-10-CM | POA: Diagnosis not present

## 2017-11-10 DIAGNOSIS — M25569 Pain in unspecified knee: Secondary | ICD-10-CM | POA: Diagnosis not present

## 2017-11-10 DIAGNOSIS — R059 Cough, unspecified: Secondary | ICD-10-CM

## 2017-11-10 NOTE — Progress Notes (Signed)
   CC: concern for mold   HPI  ?mold - black stuff on her Qtip when she cleans her nose. States black stuff near her air vent. Smells damp at home. Throat is itchy/scratchy. She does snore. Never had PSG. No excessive daytime sleepiness. Dr. Jeanie Seweredding is her psychiatrist who prescribes ambien, every night. Some skin spots she is worried about that also itch. Woke up from her sleep choking on her saliva (has GERD) 3 days ago. Takes PPI daily, sometimes BID. Then that same day she coughed up mucuous that was brown and black. No fevers, has had cold sweats. No hx of TB, hx of incarceration (1992). No hx of TB testing. Never coughed up brown stuff before. Bottom of a cup full of brown, not red. Says she was sick from mold in the past at a different apartment. Smokes cigarettes, 1 pack lasts 2 weeks. Also THC + vaping daily.   Recently gained weight, denies unintentional weight loss.   ROS: Denies CP, SOB, abdominal pain, dysuria, changes in BMs.   CC, SH/smoking status, and VS noted  Objective: BP 134/82   Pulse 72   Temp 98.8 F (37.1 C) (Oral)   Ht 6\' 3"  (1.905 m)   Wt 231 lb 3.2 oz (104.9 kg)   SpO2 99%   BMI 28.90 kg/m  Gen: NAD, alert, cooperative. HEENT: NCAT, EOMI, PERRL, poor dentition, no oropharyngeal erythema.  CV: RRR, no murmur Resp: CTAB, no wheezes, non-labored Ext: No edema, warm Neuro: Alert and oriented, Speech clear, No gross deficits  Assessment and plan:  Cough with one episode of brown sputum - suspect multifactorial, may be related to GERD as well as chronic lung irritation from multiple inhalants. Asked patient to decrease smoking and THC use. Can continue PPI. In addition, considered TB given brown sputum, but patient declined quant gold testing. Return if persistent.   Snoring/hoarseness - asked patient to follow up with PCP. Given hx of hoarseness, concern for OSA and would consider PSG.   Concern for mold - suspect above etiology for cough and brown mucous,  can consider allergist referral if persistent for mold workup, although this is less likely etiology.    Loni MuseKate Dustyn Armbrister, MD, PGY3 11/12/2017 8:46 AM

## 2017-11-10 NOTE — Patient Instructions (Signed)
It was a pleasure to see you today! Thank you for choosing [facility] for your primary care. [name] was seen for concerns for mold.   Our plans for today were:  Your cough is likely caused by several things: your reflux, possibly worsened by your snoring, and is not helped by smoking. Please do what you can to decrease your smoking both cigarettes and marijuana.   Keep an eye on what you cough up, and if you continue to see brown stuff please come back and see us.   I do not think your symptoms are caused by mold.   Please come back to see Dr. Frances FurbishWinfrey to discuss your health as well as snoring, which can contribute to your hoarseness.   Best,  Dr. Chanetta Marshallimberlake

## 2017-11-11 DIAGNOSIS — M25569 Pain in unspecified knee: Secondary | ICD-10-CM | POA: Diagnosis not present

## 2017-11-12 DIAGNOSIS — M25569 Pain in unspecified knee: Secondary | ICD-10-CM | POA: Diagnosis not present

## 2017-11-13 DIAGNOSIS — M25569 Pain in unspecified knee: Secondary | ICD-10-CM | POA: Diagnosis not present

## 2017-11-14 DIAGNOSIS — M25569 Pain in unspecified knee: Secondary | ICD-10-CM | POA: Diagnosis not present

## 2017-11-15 DIAGNOSIS — M25569 Pain in unspecified knee: Secondary | ICD-10-CM | POA: Diagnosis not present

## 2017-11-16 DIAGNOSIS — M25569 Pain in unspecified knee: Secondary | ICD-10-CM | POA: Diagnosis not present

## 2017-11-17 DIAGNOSIS — M25569 Pain in unspecified knee: Secondary | ICD-10-CM | POA: Diagnosis not present

## 2017-11-18 ENCOUNTER — Other Ambulatory Visit: Payer: Self-pay | Admitting: Family Medicine

## 2017-11-18 DIAGNOSIS — M25569 Pain in unspecified knee: Secondary | ICD-10-CM | POA: Diagnosis not present

## 2017-11-19 DIAGNOSIS — M25569 Pain in unspecified knee: Secondary | ICD-10-CM | POA: Diagnosis not present

## 2017-11-20 DIAGNOSIS — M25569 Pain in unspecified knee: Secondary | ICD-10-CM | POA: Diagnosis not present

## 2017-11-21 DIAGNOSIS — M25569 Pain in unspecified knee: Secondary | ICD-10-CM | POA: Diagnosis not present

## 2017-11-22 DIAGNOSIS — M25569 Pain in unspecified knee: Secondary | ICD-10-CM | POA: Diagnosis not present

## 2017-11-23 DIAGNOSIS — M25569 Pain in unspecified knee: Secondary | ICD-10-CM | POA: Diagnosis not present

## 2017-11-24 DIAGNOSIS — M25569 Pain in unspecified knee: Secondary | ICD-10-CM | POA: Diagnosis not present

## 2017-11-25 DIAGNOSIS — M25569 Pain in unspecified knee: Secondary | ICD-10-CM | POA: Diagnosis not present

## 2017-11-26 DIAGNOSIS — M25569 Pain in unspecified knee: Secondary | ICD-10-CM | POA: Diagnosis not present

## 2017-11-27 DIAGNOSIS — M25569 Pain in unspecified knee: Secondary | ICD-10-CM | POA: Diagnosis not present

## 2017-11-28 ENCOUNTER — Encounter: Payer: Medicaid Other | Admitting: Family Medicine

## 2017-11-28 DIAGNOSIS — M25569 Pain in unspecified knee: Secondary | ICD-10-CM | POA: Diagnosis not present

## 2017-11-29 DIAGNOSIS — M25569 Pain in unspecified knee: Secondary | ICD-10-CM | POA: Diagnosis not present

## 2017-11-30 DIAGNOSIS — M25569 Pain in unspecified knee: Secondary | ICD-10-CM | POA: Diagnosis not present

## 2017-12-01 DIAGNOSIS — M25569 Pain in unspecified knee: Secondary | ICD-10-CM | POA: Diagnosis not present

## 2017-12-02 DIAGNOSIS — M25569 Pain in unspecified knee: Secondary | ICD-10-CM | POA: Diagnosis not present

## 2017-12-03 DIAGNOSIS — M25569 Pain in unspecified knee: Secondary | ICD-10-CM | POA: Diagnosis not present

## 2017-12-04 DIAGNOSIS — M25569 Pain in unspecified knee: Secondary | ICD-10-CM | POA: Diagnosis not present

## 2017-12-05 DIAGNOSIS — M25569 Pain in unspecified knee: Secondary | ICD-10-CM | POA: Diagnosis not present

## 2017-12-06 DIAGNOSIS — M25569 Pain in unspecified knee: Secondary | ICD-10-CM | POA: Diagnosis not present

## 2017-12-07 DIAGNOSIS — M25569 Pain in unspecified knee: Secondary | ICD-10-CM | POA: Diagnosis not present

## 2017-12-08 DIAGNOSIS — M25569 Pain in unspecified knee: Secondary | ICD-10-CM | POA: Diagnosis not present

## 2017-12-09 DIAGNOSIS — M25569 Pain in unspecified knee: Secondary | ICD-10-CM | POA: Diagnosis not present

## 2017-12-10 DIAGNOSIS — M25569 Pain in unspecified knee: Secondary | ICD-10-CM | POA: Diagnosis not present

## 2017-12-11 DIAGNOSIS — M25569 Pain in unspecified knee: Secondary | ICD-10-CM | POA: Diagnosis not present

## 2017-12-12 DIAGNOSIS — M25569 Pain in unspecified knee: Secondary | ICD-10-CM | POA: Diagnosis not present

## 2017-12-13 DIAGNOSIS — M25569 Pain in unspecified knee: Secondary | ICD-10-CM | POA: Diagnosis not present

## 2017-12-14 DIAGNOSIS — M25569 Pain in unspecified knee: Secondary | ICD-10-CM | POA: Diagnosis not present

## 2017-12-15 DIAGNOSIS — M25569 Pain in unspecified knee: Secondary | ICD-10-CM | POA: Diagnosis not present

## 2017-12-16 DIAGNOSIS — M25569 Pain in unspecified knee: Secondary | ICD-10-CM | POA: Diagnosis not present

## 2017-12-17 DIAGNOSIS — M25569 Pain in unspecified knee: Secondary | ICD-10-CM | POA: Diagnosis not present

## 2017-12-18 DIAGNOSIS — M25569 Pain in unspecified knee: Secondary | ICD-10-CM | POA: Diagnosis not present

## 2017-12-19 DIAGNOSIS — M25569 Pain in unspecified knee: Secondary | ICD-10-CM | POA: Diagnosis not present

## 2017-12-20 DIAGNOSIS — M25569 Pain in unspecified knee: Secondary | ICD-10-CM | POA: Diagnosis not present

## 2017-12-21 DIAGNOSIS — M25569 Pain in unspecified knee: Secondary | ICD-10-CM | POA: Diagnosis not present

## 2017-12-22 ENCOUNTER — Encounter: Payer: Self-pay | Admitting: Family Medicine

## 2017-12-22 ENCOUNTER — Other Ambulatory Visit: Payer: Self-pay

## 2017-12-22 ENCOUNTER — Ambulatory Visit (INDEPENDENT_AMBULATORY_CARE_PROVIDER_SITE_OTHER): Payer: Medicaid Other | Admitting: Family Medicine

## 2017-12-22 VITALS — BP 128/70 | HR 65 | Temp 97.8°F | Ht 75.0 in | Wt 233.0 lb

## 2017-12-22 DIAGNOSIS — G8929 Other chronic pain: Secondary | ICD-10-CM | POA: Diagnosis not present

## 2017-12-22 DIAGNOSIS — M25561 Pain in right knee: Secondary | ICD-10-CM | POA: Diagnosis not present

## 2017-12-22 DIAGNOSIS — M25569 Pain in unspecified knee: Secondary | ICD-10-CM | POA: Diagnosis not present

## 2017-12-22 MED ORDER — METHYLPREDNISOLONE ACETATE 40 MG/ML IJ SUSP
40.0000 mg | Freq: Once | INTRAMUSCULAR | Status: AC
  Administered 2017-12-22: 40 mg via INTRA_ARTICULAR

## 2017-12-22 NOTE — Progress Notes (Signed)
[name] is a 52 y.o. female   HPI:  Knee Pain: Patient presents with knee pain involving the  right knee. Onset of the symptoms was several days ago. Inciting event: this is a longstanding problem which has been getting worse. Current symptoms include pain located medial aspect and patella area. Pain is aggravated by kneeling, rising after sitting, running and walking.  Patient has had prior knee problems. Evaluation to date: plain films: abnormal severe right knee osteoarthritis. Treatment to date: corticosteroid injection which was effective. She rates her pain 10/10.  Past Medical History:  Diagnosis Date  . Anxiety   . Arthritis   . Bipolar 1 disorder (HCC)   . 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 Depression   . ECZEMA, ATOPIC DERMATITIS 08/25/2007   Qualifier: Diagnosis of  By: Seleta Rhymes MD, Loraine Leriche    . GERD (gastroesophageal reflux disease)   . LOW BACK PAIN, CHRONIC 06/30/2006   Qualifier: Diagnosis of  By: Seleta Rhymes MD, Loraine Leriche    . OVARIAN CYST 08/12/2007   Qualifier: Diagnosis of  By: Seleta Rhymes MD, Loraine Leriche    . Panic attacks   . 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 TOBACCO ABUSE 04/13/2008  . VARICOSE VEINS, LOWER EXTREMITIES 06/30/2006   Qualifier: Diagnosis of  By: Seleta Rhymes MD, Loraine Leriche     Past Surgical History:  Procedure Laterality Date  . KNEE CARTILAGE SURGERY  2013   right     Current Outpatient Medications:  .  ALPRAZolam (XANAX) 1 MG tablet, Take 1 mg by mouth 4 (four) times daily as needed for anxiety. For anxiety, Disp: , Rfl:  .  amoxicillin (AMOXIL) 875 MG tablet, Take 1 tablet (875 mg total) by mouth 2 (two) times daily., Disp: 20 tablet, Rfl: 0 .  Cariprazine HCl (VRAYLAR) 1.5 MG CAPS, Take 1.5 mg by mouth daily., Disp: , Rfl:  .  clindamycin (CLEOCIN T) 1 % external solution, Apply topically 2 (two) times daily., Disp: 30 mL, Rfl: 2 .  colchicine 0.6 MG tablet, Take 1 tablet (0.6 mg  total) by mouth daily. Take two tablets twoday, Disp: 31 tablet, Rfl: 0 .  cyclobenzaprine (FLEXERIL) 10 MG tablet, TAKE 1 TABLET BY MOUTH TWICE A DAY AS NEEDED FOR MUSCLE SPASMS, Disp: 30 tablet, Rfl: 3 .  divalproex (DEPAKOTE ER) 500 MG 24 hr tablet, Take 500 mg by mouth at bedtime. , Disp: , Rfl:  .  Elastic Bandages & Supports (POST-OP SHOE/SOFT TOP WOMEN) MISC, Use to affected foot as directed, Disp: 1 each, Rfl: 0 .  Elastic Bandages & Supports (POST-OP SHOE/SOFT TOP WOMEN) MISC, USe to affected foot as directed., Disp: 1 each, Rfl: 0 .  ibuprofen (ADVIL,MOTRIN) 800 MG tablet, Take 1 tablet (800 mg total) by mouth every 8 (eight) hours as needed., Disp: 15 tablet, Rfl: 0 .  naproxen (NAPROSYN) 500 MG tablet, TAKE 1 TABLET BY MOUTH TWICE A DAY WITH MEALS FOR 2 WEEKS, THEN AS NEEDED, Disp: 60 tablet, Rfl: 0 .  pantoprazole (PROTONIX) 40 MG tablet, TAKE 1 TABLET BY MOUTH EVERY DAY, Disp: 30 tablet, Rfl: 2 .  sulfamethoxazole-trimethoprim (BACTRIM DS,SEPTRA DS) 800-160 MG tablet, Take 1 tablet by mouth 2 (two) times daily., Disp: 28 tablet, Rfl: 0 .  triamcinolone (NASACORT AQ) 55 MCG/ACT AERO nasal inhaler, Place 2 sprays into the nose daily., Disp: 1 Inhaler, Rfl: 0 No Known Allergies  reports that she has been  smoking cigarettes. She has been smoking about 0.00 packs per day. She has never used smokeless tobacco. She reports that she drinks alcohol. She reports that she has current or past drug history. Drug: Marijuana. Family History  Problem Relation Age of Onset  . Hypertension Mother   . Diabetes Mother   . Diabetes Father   . Hypertension Father     Knee: Normal to inspection with no erythema or effusion or obvious bony abnormalities. Palpation normal with no warmth or joint line tenderness or patellar tenderness or condyle tenderness. ROM normal in flexion and extension and lower leg rotation. Ligaments with solid consistent endpoints including ACL, PCL, LCL, MCL. Negative  Mcmurray's and provocative meniscal tests. Non painful patellar compression. Patellar and quadriceps tendons unremarkable. Hamstring and quadriceps strength is normal.  Assessment/Plan: Patient presents today for complaining of right knee pain for the past several days. Patient has a history of severe osteoarthritis of her right knee and has received multiple steroid injection inc the past with with good response. Pain is rated 10/10. Patient is here today for steroid injection.   INJECTION: Right knee severe osteoarthritis Patient was given informed consent, signed copy in the chart. Appropriate time out was taken. Area prepped and draped in usual sterile fashion. 1 cc of methylprednisolone 40 mg/ml plus  4 cc of 1% lidocaine without epinephrine was injected into the Right Knee using a(n) lateral approach. The patient tolerated the procedure well. There were no complications. Post procedure instructions were given.

## 2017-12-23 DIAGNOSIS — M25569 Pain in unspecified knee: Secondary | ICD-10-CM | POA: Diagnosis not present

## 2017-12-24 DIAGNOSIS — M25569 Pain in unspecified knee: Secondary | ICD-10-CM | POA: Diagnosis not present

## 2017-12-25 DIAGNOSIS — M25569 Pain in unspecified knee: Secondary | ICD-10-CM | POA: Diagnosis not present

## 2017-12-26 DIAGNOSIS — M25569 Pain in unspecified knee: Secondary | ICD-10-CM | POA: Diagnosis not present

## 2017-12-27 DIAGNOSIS — M25569 Pain in unspecified knee: Secondary | ICD-10-CM | POA: Diagnosis not present

## 2017-12-28 DIAGNOSIS — M25569 Pain in unspecified knee: Secondary | ICD-10-CM | POA: Diagnosis not present

## 2017-12-29 DIAGNOSIS — M25569 Pain in unspecified knee: Secondary | ICD-10-CM | POA: Diagnosis not present

## 2017-12-30 DIAGNOSIS — M25569 Pain in unspecified knee: Secondary | ICD-10-CM | POA: Diagnosis not present

## 2017-12-31 ENCOUNTER — Encounter: Payer: Medicaid Other | Admitting: Family Medicine

## 2017-12-31 DIAGNOSIS — M25569 Pain in unspecified knee: Secondary | ICD-10-CM | POA: Diagnosis not present

## 2018-01-01 DIAGNOSIS — M25569 Pain in unspecified knee: Secondary | ICD-10-CM | POA: Diagnosis not present

## 2018-01-02 ENCOUNTER — Other Ambulatory Visit: Payer: Self-pay | Admitting: Family Medicine

## 2018-01-02 DIAGNOSIS — M25569 Pain in unspecified knee: Secondary | ICD-10-CM | POA: Diagnosis not present

## 2018-01-03 DIAGNOSIS — M25569 Pain in unspecified knee: Secondary | ICD-10-CM | POA: Diagnosis not present

## 2018-01-04 DIAGNOSIS — M25569 Pain in unspecified knee: Secondary | ICD-10-CM | POA: Diagnosis not present

## 2018-01-05 DIAGNOSIS — M25569 Pain in unspecified knee: Secondary | ICD-10-CM | POA: Diagnosis not present

## 2018-01-06 DIAGNOSIS — M25569 Pain in unspecified knee: Secondary | ICD-10-CM | POA: Diagnosis not present

## 2018-01-07 DIAGNOSIS — M25569 Pain in unspecified knee: Secondary | ICD-10-CM | POA: Diagnosis not present

## 2018-01-08 DIAGNOSIS — M25569 Pain in unspecified knee: Secondary | ICD-10-CM | POA: Diagnosis not present

## 2018-01-09 ENCOUNTER — Ambulatory Visit (INDEPENDENT_AMBULATORY_CARE_PROVIDER_SITE_OTHER): Payer: Medicaid Other | Admitting: Family Medicine

## 2018-01-09 ENCOUNTER — Other Ambulatory Visit: Payer: Self-pay

## 2018-01-09 VITALS — BP 118/72 | HR 88 | Temp 97.9°F | Ht 75.0 in | Wt 237.8 lb

## 2018-01-09 DIAGNOSIS — G44209 Tension-type headache, unspecified, not intractable: Secondary | ICD-10-CM

## 2018-01-09 DIAGNOSIS — M25569 Pain in unspecified knee: Secondary | ICD-10-CM | POA: Diagnosis not present

## 2018-01-09 MED ORDER — MELOXICAM 7.5 MG PO TABS: 7.5000 mg | ORAL_TABLET | Freq: Two times a day (BID) | ORAL | 0 refills | Status: DC | PRN

## 2018-01-09 MED ORDER — MELOXICAM 7.5 MG PO TABS: 7.5000 mg | ORAL_TABLET | Freq: Every day | ORAL | 0 refills | Status: DC

## 2018-01-09 NOTE — Progress Notes (Signed)
Subjective:    [name] is a 52 y.o. female who presents for evaluation of headache. Symptoms began about 2 weeks ago. Generally, the headaches last about 30-45 min and occur several times per day. The headaches are usually worse at night. The headaches are usually severe and are located unilateral, but involves either side of her head.  The patient rates her most severe headaches a 10 on a scale from 1 to 10. Recently, the headaches have been increasing in both severity and frequency. Work attendance or other daily activities are affected by the headaches. Precipitating factors include: none which have been determined. The headaches are usually not preceded by an aura. Associated neurologic symptoms: none. The patient denies decreased physical activity, depression, dizziness, loss of balance, muscle weakness, numbness of extremities, speech difficulties, vision problems, vomiting in the early morning and worsening school/work performance. Home treatment has included resting, sleeping and alieve, exceedrine migraine, oxycodone with little improvement. Other history includes: nothing pertinent. Family history includes migraine headaches in mother. No fevers, chills.  Patient says that she has been under a lot of stress past several weeks.  Patient has been going through a tough separation with her husband.  Headache started on the day of her birthday when she had a disappointing vacation with her husband at the beach.  During the visit patient received multiple phone calls from her husband and I could hear over the phone that he was yelling at her.  Patient appeared very upset by this.  She says that her headaches did improve with oxycodone took from "cousin".  Only endorses doing this 2 or 3 times.  The following portions of the patient's history were reviewed and updated as appropriate: allergies, current medications, past family history, past medical history, past social history, past surgical history  and problem list.  Review of Systems Pertinent items noted in HPI and remainder of comprehensive ROS otherwise negative.    Objective:   Vitals:   01/09/18 1538  BP: 118/72  Pulse: 88  Temp: 97.9 F (36.6 C)  TempSrc: Oral  SpO2: 98%  Weight: 237 lb 12.8 oz (107.9 kg)  Height: 6\' 3"  (1.905 m)   Gen: NAD, sitting up in chair HEENT: Temples nontender to palpation bilaterally, PERRLA, EOMI CV: RRR with no murmurs appreciated Pulm: NWOB, CTAB with no crackles, wheezes, or rhonchi GI: Normal bowel sounds present. Soft, Nontender, Nondistended. MSK: no edema, cyanosis, or clubbing noted Skin: warm, dry Neuro: Cranial nerves II through XII grossly intact, moves all extremities Psych: Normal affect and thought content  Assessment:    Tension-type headache, episodic Likely in the setting of stress full relationship.  Patient has no red flag symptoms.   Plan:  -Discontinue OTC NSAIDs -Trial of meloxicam 7.5 mg -Return in 1 month as needed

## 2018-01-09 NOTE — Patient Instructions (Signed)
te Tension Headache A tension headache is pain, pressure, or aching that is felt over the front and sides of your head. These headaches can last from 30 minutes to several days. Follow these instructions at home: Managing pain  Take over-the-counter and prescription medicines only as told by your doctor.  Lie down in a dark, quiet room when you have a headache.  If directed, apply ice to your head and neck area: ? Put ice in a plastic bag. ? Place a towel between your skin and the bag. ? Leave the ice on for 20 minutes, 2-3 times per day.  Use a heating pad or a hot shower to apply heat to your head and neck area as told by your doctor. Eating and drinking  Eat meals on a regular schedule.  Do not drink a lot of alcohol.  Do not use a lot of caffeine, or stop using caffeine. General instructions  Keep all follow-up visits as told by your doctor. This is important.  Keep a journal to find out if certain things bring on headaches. For example, write down: ? What you eat and drink. ? How much sleep you get. ? Any change to your diet or medicines.  Try getting a massage, or doing other things that help you to relax.  Lessen stress.  Sit up straight. Do not tighten (tense) your muscles.  Do not use tobacco products. This includes cigarettes, chewing tobacco, or e-cigarettes. If you need help quitting, ask your doctor.  Exercise regularly as told by your doctor.  Get enough sleep. This may mean 7-9 hours of sleep. Contact a doctor if:  Your symptoms are not helped by medicine.  You have a headache that feels different from your usual headache.  You feel sick to your stomach (nauseous) or you throw up (vomit).  You have a fever. Get help right away if:  Your headache becomes very bad.  You keep throwing up.  You have a stiff neck.  You have trouble seeing.  You have trouble speaking.  You have pain in your eye or ear.  Your muscles are weak or you lose  muscle control.  You lose your balance or you have trouble walking.  You feel like you will pass out (faint) or you pass out.  You have confusion. This information is not intended to replace advice given to you by your health care provider. Make sure you discuss any questions you have with your health care provider. Document Released: 06/05/2009 Document Revised: 11/09/2015 Document Reviewed: 07/04/2014 Elsevier Interactive Patient Education  [employer].

## 2018-01-10 DIAGNOSIS — M25569 Pain in unspecified knee: Secondary | ICD-10-CM | POA: Diagnosis not present

## 2018-01-11 DIAGNOSIS — M25569 Pain in unspecified knee: Secondary | ICD-10-CM | POA: Diagnosis not present

## 2018-01-12 ENCOUNTER — Telehealth: Payer: Self-pay | Admitting: Family Medicine

## 2018-01-12 DIAGNOSIS — M25569 Pain in unspecified knee: Secondary | ICD-10-CM | POA: Diagnosis not present

## 2018-01-12 NOTE — Telephone Encounter (Signed)
Pt was seen by Dr. Janee Morn last week and prescribed Meloxicam for her headaches. Pt states that her headaches are not getting any better and her pain is a 10/10. She would like to know if there is something else she can be given for these headaches. Please call patient if this is possible.

## 2018-01-12 NOTE — Telephone Encounter (Signed)
Called patient to follow up on symptoms. Pt said that her HA is getting worse. She got no relief with the meloxicam 7.5 mg. She has also taken 15 mg x2 days without improvement. I informed pt that given the worsening HA that she would need to come into the office to be evaluated or to go to ED for acute pain control and to rule out any serious intracranial processes. Patient acknowledged this. She plans to call the clinic back to try and schedule a follow up.

## 2018-01-13 ENCOUNTER — Ambulatory Visit (INDEPENDENT_AMBULATORY_CARE_PROVIDER_SITE_OTHER): Payer: Medicaid Other | Admitting: Family Medicine

## 2018-01-13 ENCOUNTER — Encounter: Payer: Self-pay | Admitting: Family Medicine

## 2018-01-13 ENCOUNTER — Other Ambulatory Visit: Payer: Self-pay

## 2018-01-13 DIAGNOSIS — G4452 New daily persistent headache (NDPH): Secondary | ICD-10-CM

## 2018-01-13 DIAGNOSIS — M25569 Pain in unspecified knee: Secondary | ICD-10-CM | POA: Diagnosis not present

## 2018-01-13 NOTE — Progress Notes (Signed)
   Subjective:    Patient ID: [name], female    DOB: May 12, 1965, 52 y.o.   MRN: [id number]   CC:  headaches  HPI: Headaches Patient presenting today for worsening headaches.  States that no medications have been helping.  Seen recently by Dr. Janee Morn on 10/18 for headaches. At that time she was prescribed meloxicam 7.5 mg.  States that she has triple the dose in it and still not helping.  Patient is now having difficulty sleeping as well due to increased pain.  Headaches initially began on October 4 have been worsening since.  Patient reports headaches around her temples and sometimes radiates to her eyes.  States these are bilateral.  Occasionally can be unilateral at that time she becomes teary-eyed as well.  Denies any runny nose.  Does report photo and phonophobia.  Reports some vision changes when she has severe headaches.  Denies any nausea or vomiting.  Patient has family history positive for migraines.  Headaches are worsened with standing for long periods of time.  Patient has tried Advil and Excedrin with no relief.  States the only thing that alleviates headaches is smoking marijuana.  Patient is unable to take Tylenol this causes her nausea and vomiting.  Denies any symptoms of aura.  Patient drinks plenty of water drinking for 24 ounce bottles and 416 ounce bottles a day.   Objective:  BP 140/78   Pulse 68   Temp 98.4 F (36.9 C) (Oral)   Ht 6\' 3"  (1.905 m)   Wt 246 lb (111.6 kg)   SpO2 99%   BMI 30.75 kg/m  Vitals and nursing note reviewed  General: well nourished, in no acute distress HEENT: normocephalic, PERRL, no scleral icterus or conjunctival pallor, no nasal discharge, moist mucous membranes, good dentition without erythema or discharge noted in posterior oropharynx Cardiac: RRR, clear S1 and S2, no murmurs, rubs, or gallops Respiratory: clear to auscultation bilaterally, no increased work of breathing Abdomen: soft, nontender, nondistended, no masses or  organomegaly. Bowel sounds present Extremities: no edema or cyanosis. Warm, well perfused.   Skin: warm and dry, no rashes noted Neuro: alert and oriented, no focal deficits, CN2-12 intact, sensation intact bilaterally, no finger to nose dysmetria, 5/5 muscle strength bilaterally, normal grip strength    Assessment & Plan:    Headache Likely secondary to tension headaches.  Can also consider cluster headaches given teary-eyed however symptoms seem to last all day and are not quick onset and headache is not consistently unilateral with.  Eyes.  Unlikely migraines as no nausea vomiting or other GI symptoms.  No aura present.  Unlikely related to hemorrhage as symptoms have begun worsening since October 4.  No thunderclap.  Given that symptoms are continuing to worsen we will plan to obtain CT without contrast of head to rule out possible intracranial pathology or tumors.  Appointment for imaging made prior to patient leaving and patient aware of date and time.  Advised patient to use over-the-counter Excedrin Migraine as this has aspirin and caffeine.  Patient unable to take acetaminophen.  Encouraged no narcotic use as this may worsen headache.  Strict return precautions given.  Follow-up in 2 weeks if no improvement.   Discussed with Dr. Leveda Anna  Return in about 2 weeks (around 01/27/2018), or if symptoms worsen or fail to improve.   Oralia Manis, DO, PGY-2

## 2018-01-13 NOTE — Patient Instructions (Addendum)
Tension Headache A tension headache is pain, pressure, or aching that is felt over the front and sides of your head. These headaches can last from 30 minutes to several days. Follow these instructions at home: Managing pain  Take over-the-counter and prescription medicines only as told by your doctor.  Lie down in a dark, quiet room when you have a headache.  If directed, apply ice to your head and neck area: ? Put ice in a plastic bag. ? Place a towel between your skin and the bag. ? Leave the ice on for 20 minutes, 2-3 times per day.  Use a heating pad or a hot shower to apply heat to your head and neck area as told by your doctor. Eating and drinking  Eat meals on a regular schedule.  Do not drink a lot of alcohol.  Do not use a lot of caffeine, or stop using caffeine. General instructions  Keep all follow-up visits as told by your doctor. This is important.  Keep a journal to find out if certain things bring on headaches. For example, write down: ? What you eat and drink. ? How much sleep you get. ? Any change to your diet or medicines.  Try getting a massage, or doing other things that help you to relax.  Lessen stress.  Sit up straight. Do not tighten (tense) your muscles.  Do not use tobacco products. This includes cigarettes, chewing tobacco, or e-cigarettes. If you need help quitting, ask your doctor.  Exercise regularly as told by your doctor.  Get enough sleep. This may mean 7-9 hours of sleep. Contact a doctor if:  Your symptoms are not helped by medicine.  You have a headache that feels different from your usual headache.  You feel sick to your stomach (nauseous) or you throw up (vomit).  You have a fever. Get help right away if:  Your headache becomes very bad.  You keep throwing up.  You have a stiff neck.  You have trouble seeing.  You have trouble speaking.  You have pain in your eye or ear.  Your muscles are weak or you lose muscle  control.  You lose your balance or you have trouble walking.  You feel like you will pass out (faint) or you pass out.  You have confusion. This information is not intended to replace advice given to you by your health care provider. Make sure you discuss any questions you have with your health care provider. Document Released: 06/05/2009 Document Revised: 11/09/2015 Document Reviewed: 07/04/2014 Elsevier Interactive Patient Education  [employer].   It was a pleasure seeing you today.   Today we discussed your headaches  For your headaches: I have obtained a CT head to ensure we are not missing pathology. I will either call or send a letter with results. Please use exedrin migraine (aspirin + caffeine, or any Over the counter medication with caffeine) to alleviate pain. You can use ice/heating pads as well in the interim. If you continue to have headaches please follow up with your PCP in 2 weeks.   Please follow up in 2 weeks or sooner if symptoms persist or worsen. Please call the clinic immediately if you have any concerns.   Our clinic's number is [phone]. Please call with questions or concerns.   Please go to the emergency room if you have worsening headaches, neurological changes, facial droop, weakness, problems with walking.   Thank you,  Oralia Manis, DO

## 2018-01-13 NOTE — Assessment & Plan Note (Addendum)
Likely secondary to tension headaches.  Can also consider cluster headaches given teary-eyed however symptoms seem to last all day and are not quick onset and headache is not consistently unilateral with.  Eyes.  Unlikely migraines as no nausea vomiting or other GI symptoms.  No aura present.  Unlikely related to hemorrhage as symptoms have begun worsening since October 4.  No thunderclap.  Given that symptoms are continuing to worsen we will plan to obtain CT without contrast of head to rule out possible intracranial pathology or tumors.  Appointment for imaging made prior to patient leaving and patient aware of date and time.  Advised patient to use over-the-counter Excedrin Migraine as this has aspirin and caffeine.  Patient unable to take acetaminophen.  Encouraged no narcotic use as this may worsen headache.  Strict return precautions given.  Follow-up in 2 weeks if no improvement.

## 2018-01-14 DIAGNOSIS — M25569 Pain in unspecified knee: Secondary | ICD-10-CM | POA: Diagnosis not present

## 2018-01-15 DIAGNOSIS — M25569 Pain in unspecified knee: Secondary | ICD-10-CM | POA: Diagnosis not present

## 2018-01-16 DIAGNOSIS — M25569 Pain in unspecified knee: Secondary | ICD-10-CM | POA: Diagnosis not present

## 2018-01-17 DIAGNOSIS — M25569 Pain in unspecified knee: Secondary | ICD-10-CM | POA: Diagnosis not present

## 2018-01-18 DIAGNOSIS — M25569 Pain in unspecified knee: Secondary | ICD-10-CM | POA: Diagnosis not present

## 2018-01-19 ENCOUNTER — Ambulatory Visit (HOSPITAL_COMMUNITY): Payer: Medicaid Other | Attending: Family Medicine

## 2018-01-19 DIAGNOSIS — M25569 Pain in unspecified knee: Secondary | ICD-10-CM | POA: Diagnosis not present

## 2018-01-20 DIAGNOSIS — M25569 Pain in unspecified knee: Secondary | ICD-10-CM | POA: Diagnosis not present

## 2018-01-21 DIAGNOSIS — M25569 Pain in unspecified knee: Secondary | ICD-10-CM | POA: Diagnosis not present

## 2018-01-22 DIAGNOSIS — M25569 Pain in unspecified knee: Secondary | ICD-10-CM | POA: Diagnosis not present

## 2018-01-23 DIAGNOSIS — M25569 Pain in unspecified knee: Secondary | ICD-10-CM | POA: Diagnosis not present

## 2018-01-24 DIAGNOSIS — M25569 Pain in unspecified knee: Secondary | ICD-10-CM | POA: Diagnosis not present

## 2018-01-25 DIAGNOSIS — M25569 Pain in unspecified knee: Secondary | ICD-10-CM | POA: Diagnosis not present

## 2018-01-26 DIAGNOSIS — M25569 Pain in unspecified knee: Secondary | ICD-10-CM | POA: Diagnosis not present

## 2018-01-27 DIAGNOSIS — M25569 Pain in unspecified knee: Secondary | ICD-10-CM | POA: Diagnosis not present

## 2018-01-28 DIAGNOSIS — M25569 Pain in unspecified knee: Secondary | ICD-10-CM | POA: Diagnosis not present

## 2018-01-29 DIAGNOSIS — M25569 Pain in unspecified knee: Secondary | ICD-10-CM | POA: Diagnosis not present

## 2018-01-30 DIAGNOSIS — M25569 Pain in unspecified knee: Secondary | ICD-10-CM | POA: Diagnosis not present

## 2018-01-31 DIAGNOSIS — M25569 Pain in unspecified knee: Secondary | ICD-10-CM | POA: Diagnosis not present

## 2018-02-01 DIAGNOSIS — M25569 Pain in unspecified knee: Secondary | ICD-10-CM | POA: Diagnosis not present

## 2018-02-02 DIAGNOSIS — M25569 Pain in unspecified knee: Secondary | ICD-10-CM | POA: Diagnosis not present

## 2018-02-03 DIAGNOSIS — M25569 Pain in unspecified knee: Secondary | ICD-10-CM | POA: Diagnosis not present

## 2018-02-04 DIAGNOSIS — M25569 Pain in unspecified knee: Secondary | ICD-10-CM | POA: Diagnosis not present

## 2018-02-05 DIAGNOSIS — M25569 Pain in unspecified knee: Secondary | ICD-10-CM | POA: Diagnosis not present

## 2018-02-06 DIAGNOSIS — M25569 Pain in unspecified knee: Secondary | ICD-10-CM | POA: Diagnosis not present

## 2018-02-07 DIAGNOSIS — M25569 Pain in unspecified knee: Secondary | ICD-10-CM | POA: Diagnosis not present

## 2018-02-08 DIAGNOSIS — M25569 Pain in unspecified knee: Secondary | ICD-10-CM | POA: Diagnosis not present

## 2018-02-09 DIAGNOSIS — M25569 Pain in unspecified knee: Secondary | ICD-10-CM | POA: Diagnosis not present

## 2018-02-10 DIAGNOSIS — M25569 Pain in unspecified knee: Secondary | ICD-10-CM | POA: Diagnosis not present

## 2018-02-11 DIAGNOSIS — M25569 Pain in unspecified knee: Secondary | ICD-10-CM | POA: Diagnosis not present

## 2018-02-12 ENCOUNTER — Other Ambulatory Visit: Payer: Self-pay | Admitting: Family Medicine

## 2018-02-12 DIAGNOSIS — G44209 Tension-type headache, unspecified, not intractable: Secondary | ICD-10-CM

## 2018-02-12 DIAGNOSIS — M25569 Pain in unspecified knee: Secondary | ICD-10-CM | POA: Diagnosis not present

## 2018-02-13 DIAGNOSIS — M25569 Pain in unspecified knee: Secondary | ICD-10-CM | POA: Diagnosis not present

## 2018-02-14 DIAGNOSIS — M25569 Pain in unspecified knee: Secondary | ICD-10-CM | POA: Diagnosis not present

## 2018-02-15 DIAGNOSIS — M25569 Pain in unspecified knee: Secondary | ICD-10-CM | POA: Diagnosis not present

## 2018-02-16 DIAGNOSIS — M25569 Pain in unspecified knee: Secondary | ICD-10-CM | POA: Diagnosis not present

## 2018-02-17 DIAGNOSIS — M25569 Pain in unspecified knee: Secondary | ICD-10-CM | POA: Diagnosis not present

## 2018-02-18 DIAGNOSIS — M25569 Pain in unspecified knee: Secondary | ICD-10-CM | POA: Diagnosis not present

## 2018-02-19 DIAGNOSIS — M25569 Pain in unspecified knee: Secondary | ICD-10-CM | POA: Diagnosis not present

## 2018-02-20 DIAGNOSIS — M25569 Pain in unspecified knee: Secondary | ICD-10-CM | POA: Diagnosis not present

## 2018-02-21 DIAGNOSIS — M25569 Pain in unspecified knee: Secondary | ICD-10-CM | POA: Diagnosis not present

## 2018-02-22 DIAGNOSIS — M25569 Pain in unspecified knee: Secondary | ICD-10-CM | POA: Diagnosis not present

## 2018-02-23 DIAGNOSIS — M25569 Pain in unspecified knee: Secondary | ICD-10-CM | POA: Diagnosis not present

## 2018-02-24 DIAGNOSIS — M25569 Pain in unspecified knee: Secondary | ICD-10-CM | POA: Diagnosis not present

## 2018-02-25 DIAGNOSIS — M25569 Pain in unspecified knee: Secondary | ICD-10-CM | POA: Diagnosis not present

## 2018-02-26 DIAGNOSIS — M25569 Pain in unspecified knee: Secondary | ICD-10-CM | POA: Diagnosis not present

## 2018-02-27 DIAGNOSIS — M25569 Pain in unspecified knee: Secondary | ICD-10-CM | POA: Diagnosis not present

## 2018-02-28 DIAGNOSIS — M25569 Pain in unspecified knee: Secondary | ICD-10-CM | POA: Diagnosis not present

## 2018-03-01 DIAGNOSIS — M25569 Pain in unspecified knee: Secondary | ICD-10-CM | POA: Diagnosis not present

## 2018-03-02 DIAGNOSIS — M25569 Pain in unspecified knee: Secondary | ICD-10-CM | POA: Diagnosis not present

## 2018-03-03 DIAGNOSIS — M25569 Pain in unspecified knee: Secondary | ICD-10-CM | POA: Diagnosis not present

## 2018-03-04 DIAGNOSIS — M25569 Pain in unspecified knee: Secondary | ICD-10-CM | POA: Diagnosis not present

## 2018-03-05 DIAGNOSIS — M25569 Pain in unspecified knee: Secondary | ICD-10-CM | POA: Diagnosis not present

## 2018-03-06 ENCOUNTER — Other Ambulatory Visit: Payer: Self-pay | Admitting: Family Medicine

## 2018-03-06 DIAGNOSIS — M25569 Pain in unspecified knee: Secondary | ICD-10-CM | POA: Diagnosis not present

## 2018-03-07 DIAGNOSIS — M25569 Pain in unspecified knee: Secondary | ICD-10-CM | POA: Diagnosis not present

## 2018-03-08 DIAGNOSIS — M25569 Pain in unspecified knee: Secondary | ICD-10-CM | POA: Diagnosis not present

## 2018-03-09 DIAGNOSIS — M25569 Pain in unspecified knee: Secondary | ICD-10-CM | POA: Diagnosis not present

## 2018-03-09 NOTE — Progress Notes (Signed)
tr Subjective:   Patient ID: [name]    DOB: 12/11/1965, 52 y.o. female   MRN: [id number]  [name] is a 52 y.o. female with a history of varicose veins, GERD, OA, hidradenitis, bipolar d/o, tobacco use, irregular menses, chronic LBP here for   L knee pain Patient presents with left knee pain that started Sunday.  Has a history of gout in her hands and toes, states this pain feels similar to prior gout attacks.  She has tried colchicine and ibuprofen with some relief but found much relief when she took some of her cousins Vicodin and reports she was able to get some rest with this.  Describes pain as sharp, throbbing, and very tender to palpation.  She has had 2 sleep outside of her sheets at night due to the pain.  Denies any recent trauma.  Reports some redness and swelling.  Denies fever, weight loss, rashes.  Review of Systems:  Per HPI.  PMFSH, medications and smoking status reviewed.  Objective:   BP 132/82   Pulse 95   Temp 97.7 F (36.5 C) (Oral)   Ht 6\' 3"  (1.905 m)   Wt 253 lb 6 oz (114.9 kg)   SpO2 98%   BMI 31.67 kg/m  Vitals and nursing note reviewed.  General: Obese female, in no acute distress with non-toxic appearance Skin: warm, dry, no rashes or lesions Extremities: warm and well perfused.   MSK: Left knee tender to palpation diffusely without obvious deformity.  No obvious swelling, redness.  Range of motion limited by pain.  Ultrasound exam revealed small joint space effusion with osteophyte noted to lateral joint space and slight bulging of lateral meniscus. Neuro: Alert and oriented, speech normal  Effusion Drainage and Steroid Injection: Patient was given informed consent, signed copy in the chart. Appropriate time out was taken. Area prepped in usual clean fashion. 5cc lidocaine without epinephrine injected into lateral joint space with subsequent aspiration of 20cc of yellowish clear joint fluid, collected and sent for analyzation. 4 cc of  methylprednisolone 40 mg/ml plus  1 cc of 1% lidocaine without epinephrine was injected into the L knee using a(n) anterior lateral approach. The patient tolerated the procedure well. There were no immediate complications. Post procedure instructions were given.  Assessment & Plan:   Left knee pain With painful range of motion and some effusion noted on sonographic exam.  Given lack of obvious redness, warmth, or tophi on sonographic exam, question gout versus osteoarthritis given history of significant OA in contralateral knee.  No evidence for infection.  Aspiration of joint fluid with subsequent steroid injection performed (see procedure note above) without any immediate complications, patient tolerated procedure well.  Sent joint fluid aspiration for analyzation of cell count and crystals, will call with results.  Given significant pain gave 3-day supply of tramadol, encouraged continued use of colchicine and NSAIDs.  Could consider standing upright knee x-ray in the future to evaluate for OA.  Orders Placed This Encounter  Procedures  . Synovial fluid, cell count   Meds ordered this encounter  Medications  . methylPREDNISolone acetate (DEPO-MEDROL) injection 40 mg  . traMADol (ULTRAM) 50 MG tablet    Sig: Take 1 tablet (50 mg total) by mouth every 8 (eight) hours as needed for up to 3 days.    Dispense:  9 tablet    Refill:  0   Precepted with Dr. Laureen OchsPinney.  Ellwood DenseAlison Rumball, DO PGY-2,  Family Medicine 03/10/2018 9:30 PM

## 2018-03-10 ENCOUNTER — Telehealth: Payer: Self-pay | Admitting: *Deleted

## 2018-03-10 ENCOUNTER — Ambulatory Visit (INDEPENDENT_AMBULATORY_CARE_PROVIDER_SITE_OTHER): Payer: Medicaid Other | Admitting: Family Medicine

## 2018-03-10 ENCOUNTER — Telehealth: Payer: Self-pay

## 2018-03-10 VITALS — BP 132/82 | HR 95 | Temp 97.7°F | Ht 75.0 in | Wt 253.4 lb

## 2018-03-10 DIAGNOSIS — M25562 Pain in left knee: Secondary | ICD-10-CM | POA: Insufficient documentation

## 2018-03-10 DIAGNOSIS — M25569 Pain in unspecified knee: Secondary | ICD-10-CM | POA: Diagnosis not present

## 2018-03-10 MED ORDER — METHYLPREDNISOLONE ACETATE 40 MG/ML IJ SUSP
40.0000 mg | Freq: Once | INTRAMUSCULAR | Status: AC
  Administered 2018-03-10: 40 mg via INTRA_ARTICULAR

## 2018-03-10 MED ORDER — TRAMADOL HCL 50 MG PO TABS: 50.0000 mg | ORAL_TABLET | Freq: Three times a day (TID) | ORAL | 0 refills | Status: AC | PRN

## 2018-03-10 NOTE — Patient Instructions (Signed)
It was great to see you!  Our plans for today:  - We gave you a steroid shot for your knee pain. We sent the fluid from your knee to the lab, we will call you with these results. - If your knee pain continues, we can consider referral to orthopedic surgery at that time. - If you notice worsened redness, swelling come back to the clinic to be seen.  Take care and seek immediate care sooner if you develop any concerns.   Dr. Mollie Germanyumball [facility]

## 2018-03-10 NOTE — Assessment & Plan Note (Addendum)
With painful range of motion and some effusion noted on sonographic exam.  Given lack of obvious redness, warmth, or tophi on sonographic exam, question gout versus osteoarthritis given history of significant OA in contralateral knee.  No evidence for infection.  Aspiration of joint fluid with subsequent steroid injection performed (see procedure note above) without any immediate complications, patient tolerated procedure well.  Sent joint fluid aspiration for analyzation of cell count and crystals, will call with results.  Given significant pain gave 3-day supply of tramadol, encouraged continued use of colchicine and NSAIDs.  Could consider standing upright knee x-ray in the future to evaluate for OA.

## 2018-03-10 NOTE — Telephone Encounter (Signed)
Pharmacy needs provider to call them and approve the use of tramadol and benzo together.  Phone number is [phone].  Will forward to provider who saw her today. Eual Lindstrom, Maryjo RochesterJessica Dawn, CMA

## 2018-03-10 NOTE — Assessment & Plan Note (Signed)
>>  ASSESSMENT AND PLAN FOR LEFT KNEE PAIN WRITTEN ON 03/10/2018  9:30 PM BY RUMBALL, ALISON, DO  With painful range of motion and some effusion noted on sonographic exam.  Given lack of obvious redness, warmth, or tophi on sonographic exam, question gout versus osteoarthritis given history of significant OA in contralateral knee.  No evidence for infection.  Aspiration of joint fluid with subsequent steroid injection performed (see procedure note above) without any immediate complications, patient tolerated procedure well.  Sent joint fluid aspiration for analyzation of cell count and crystals, will call with results.  Given significant pain gave 3-day supply of tramadol, encouraged continued use of colchicine and NSAIDs.  Could consider standing upright knee x-ray in the future to evaluate for OA.

## 2018-03-10 NOTE — Telephone Encounter (Signed)
Phone call from patient that Tramadol requires PA. Info submitted via [employer]. Tramadol approved 03/10/18-04/09/18, PA # [id number]. [facility] notified. Ples SpecterAlisa Brake, RN [facility])

## 2018-03-11 DIAGNOSIS — M25569 Pain in unspecified knee: Secondary | ICD-10-CM | POA: Diagnosis not present

## 2018-03-11 NOTE — Telephone Encounter (Signed)
Spoke with pharmacy. Given short duration of tramadol (2-3 day course) would be ok to take with PRN use of benzo. Discussed risks of concurrent use during visit with patient.

## 2018-03-12 DIAGNOSIS — M25569 Pain in unspecified knee: Secondary | ICD-10-CM | POA: Diagnosis not present

## 2018-03-12 LAB — SYNOVIAL FLUID, CELL COUNT
EOS FL: 0 %
LYMPHS FL: 35 %
Lining Cells, Synovial: 1 %
Macrophages Fld: 51 %
Nuc cell # Fld: 666 cells/uL — ABNORMAL HIGH (ref 0–200)
Polys, Fluid: 13 %
RBC, Fluid: 2000 /uL

## 2018-03-13 DIAGNOSIS — M25569 Pain in unspecified knee: Secondary | ICD-10-CM | POA: Diagnosis not present

## 2018-03-14 DIAGNOSIS — M25569 Pain in unspecified knee: Secondary | ICD-10-CM | POA: Diagnosis not present

## 2018-03-15 DIAGNOSIS — M25569 Pain in unspecified knee: Secondary | ICD-10-CM | POA: Diagnosis not present

## 2018-03-16 DIAGNOSIS — M25569 Pain in unspecified knee: Secondary | ICD-10-CM | POA: Diagnosis not present

## 2018-03-17 DIAGNOSIS — M25569 Pain in unspecified knee: Secondary | ICD-10-CM | POA: Diagnosis not present

## 2018-03-18 DIAGNOSIS — M25569 Pain in unspecified knee: Secondary | ICD-10-CM | POA: Diagnosis not present

## 2018-03-19 DIAGNOSIS — M25569 Pain in unspecified knee: Secondary | ICD-10-CM | POA: Diagnosis not present

## 2018-03-20 DIAGNOSIS — M25569 Pain in unspecified knee: Secondary | ICD-10-CM | POA: Diagnosis not present

## 2018-03-21 DIAGNOSIS — M25569 Pain in unspecified knee: Secondary | ICD-10-CM | POA: Diagnosis not present

## 2018-03-22 DIAGNOSIS — M25569 Pain in unspecified knee: Secondary | ICD-10-CM | POA: Diagnosis not present

## 2018-03-23 DIAGNOSIS — M25569 Pain in unspecified knee: Secondary | ICD-10-CM | POA: Diagnosis not present

## 2018-03-24 DIAGNOSIS — M25569 Pain in unspecified knee: Secondary | ICD-10-CM | POA: Diagnosis not present

## 2018-03-25 DIAGNOSIS — M25569 Pain in unspecified knee: Secondary | ICD-10-CM | POA: Diagnosis not present

## 2018-03-26 DIAGNOSIS — M25569 Pain in unspecified knee: Secondary | ICD-10-CM | POA: Diagnosis not present

## 2018-03-27 ENCOUNTER — Telehealth: Payer: Self-pay

## 2018-03-27 DIAGNOSIS — M25569 Pain in unspecified knee: Secondary | ICD-10-CM | POA: Diagnosis not present

## 2018-03-27 DIAGNOSIS — M25562 Pain in left knee: Secondary | ICD-10-CM

## 2018-03-27 MED ORDER — MELOXICAM 15 MG PO TABS: 15.0000 mg | ORAL_TABLET | Freq: Every day | ORAL | 0 refills | Status: DC

## 2018-03-27 NOTE — Telephone Encounter (Signed)
Pt informed of below. Zimmerman Rumple, April D, CMA  

## 2018-03-27 NOTE — Telephone Encounter (Signed)
Pt called nurse line stating she is still in so much left knee pain, despite the injection on 12/17. Pt stated she is in so much pain she is falling. Pt denies any redness to the area, swelling or infection. Pt states, "my knee just throbs all night long, please help me." Pt completed her Tramadol with no relief. Pt is requesting something stronger to be called in. I would have offered her an apt, however no openings for today as of now. Please advise.

## 2018-03-27 NOTE — Telephone Encounter (Signed)
I ordered standing knee xrays for her at [facility]. I am unable to prescribe [facility] any [facility] medication as she is on a benzodiazapine and can't increase the dose of her tramadol. We will try 15mg  mobic and have her follow up after she gets her knee xrays.  Precepted with Dr. Charlesetta GaribaldiNeal  Cathy Kaminsky, DO PGY-2, [facility] 03/27/2018 2:58 PM

## 2018-03-28 ENCOUNTER — Other Ambulatory Visit: Payer: Self-pay | Admitting: Family Medicine

## 2018-03-28 DIAGNOSIS — G44209 Tension-type headache, unspecified, not intractable: Secondary | ICD-10-CM

## 2018-03-28 DIAGNOSIS — M25569 Pain in unspecified knee: Secondary | ICD-10-CM | POA: Diagnosis not present

## 2018-03-29 DIAGNOSIS — M25569 Pain in unspecified knee: Secondary | ICD-10-CM | POA: Diagnosis not present

## 2018-03-30 DIAGNOSIS — M25569 Pain in unspecified knee: Secondary | ICD-10-CM | POA: Diagnosis not present

## 2018-03-31 DIAGNOSIS — M25569 Pain in unspecified knee: Secondary | ICD-10-CM | POA: Diagnosis not present

## 2018-04-01 DIAGNOSIS — M25569 Pain in unspecified knee: Secondary | ICD-10-CM | POA: Diagnosis not present

## 2018-04-02 DIAGNOSIS — M25569 Pain in unspecified knee: Secondary | ICD-10-CM | POA: Diagnosis not present

## 2018-04-03 DIAGNOSIS — M25569 Pain in unspecified knee: Secondary | ICD-10-CM | POA: Diagnosis not present

## 2018-04-04 DIAGNOSIS — M25569 Pain in unspecified knee: Secondary | ICD-10-CM | POA: Diagnosis not present

## 2018-04-05 DIAGNOSIS — M25569 Pain in unspecified knee: Secondary | ICD-10-CM | POA: Diagnosis not present

## 2018-04-06 DIAGNOSIS — M25569 Pain in unspecified knee: Secondary | ICD-10-CM | POA: Diagnosis not present

## 2018-04-07 DIAGNOSIS — M25569 Pain in unspecified knee: Secondary | ICD-10-CM | POA: Diagnosis not present

## 2018-04-08 DIAGNOSIS — M25569 Pain in unspecified knee: Secondary | ICD-10-CM | POA: Diagnosis not present

## 2018-04-09 DIAGNOSIS — M25569 Pain in unspecified knee: Secondary | ICD-10-CM | POA: Diagnosis not present

## 2018-04-10 DIAGNOSIS — M25569 Pain in unspecified knee: Secondary | ICD-10-CM | POA: Diagnosis not present

## 2018-04-11 DIAGNOSIS — M25569 Pain in unspecified knee: Secondary | ICD-10-CM | POA: Diagnosis not present

## 2018-04-12 DIAGNOSIS — M25569 Pain in unspecified knee: Secondary | ICD-10-CM | POA: Diagnosis not present

## 2018-04-13 DIAGNOSIS — M25569 Pain in unspecified knee: Secondary | ICD-10-CM | POA: Diagnosis not present

## 2018-04-14 ENCOUNTER — Other Ambulatory Visit: Payer: Self-pay | Admitting: Family Medicine

## 2018-04-14 DIAGNOSIS — M25569 Pain in unspecified knee: Secondary | ICD-10-CM | POA: Diagnosis not present

## 2018-04-15 DIAGNOSIS — M25569 Pain in unspecified knee: Secondary | ICD-10-CM | POA: Diagnosis not present

## 2018-04-16 DIAGNOSIS — M25569 Pain in unspecified knee: Secondary | ICD-10-CM | POA: Diagnosis not present

## 2018-04-17 DIAGNOSIS — M25569 Pain in unspecified knee: Secondary | ICD-10-CM | POA: Diagnosis not present

## 2018-04-18 DIAGNOSIS — M25569 Pain in unspecified knee: Secondary | ICD-10-CM | POA: Diagnosis not present

## 2018-04-19 DIAGNOSIS — M25569 Pain in unspecified knee: Secondary | ICD-10-CM | POA: Diagnosis not present

## 2018-04-20 DIAGNOSIS — M25569 Pain in unspecified knee: Secondary | ICD-10-CM | POA: Diagnosis not present

## 2018-04-21 ENCOUNTER — Ambulatory Visit: Payer: Self-pay

## 2018-04-21 DIAGNOSIS — M25569 Pain in unspecified knee: Secondary | ICD-10-CM | POA: Diagnosis not present

## 2018-04-22 ENCOUNTER — Other Ambulatory Visit: Payer: Self-pay

## 2018-04-22 ENCOUNTER — Ambulatory Visit (INDEPENDENT_AMBULATORY_CARE_PROVIDER_SITE_OTHER): Payer: Medicaid Other | Admitting: Family Medicine

## 2018-04-22 VITALS — BP 130/78 | HR 79 | Temp 98.0°F | Ht 75.0 in | Wt 254.2 lb

## 2018-04-22 DIAGNOSIS — M25569 Pain in unspecified knee: Secondary | ICD-10-CM | POA: Diagnosis not present

## 2018-04-22 DIAGNOSIS — Z1239 Encounter for other screening for malignant neoplasm of breast: Secondary | ICD-10-CM

## 2018-04-22 DIAGNOSIS — M170 Bilateral primary osteoarthritis of knee: Secondary | ICD-10-CM

## 2018-04-22 DIAGNOSIS — N951 Menopausal and female climacteric states: Secondary | ICD-10-CM

## 2018-04-22 DIAGNOSIS — Z1211 Encounter for screening for malignant neoplasm of colon: Secondary | ICD-10-CM

## 2018-04-22 MED ORDER — VENLAFAXINE HCL 37.5 MG PO TABS: 37.5000 mg | ORAL_TABLET | Freq: Two times a day (BID) | ORAL | 0 refills | Status: DC

## 2018-04-22 NOTE — Progress Notes (Signed)
CC: knee pain, hot flashes  HPI  L knee pain - had effusion and drainage here 6 weeks ago. Says she also has R knee pain, cartilage is gone, needs a replacement, hasn't seen ortho yet. Better after last visit for about 2-3 weeks. Pain feels like pressure, worse with trying to sleep. Throbbing. Walking and standing makes it worse. No known injury or trauma. No hx of surgery. Gave out twice in October. No fever. Hx of arthroscopy of R knee.   Hot flashes - been going on about months. All day, not continuous but frequent. Was sweating at night. Tried behavioral things such as lighter clothes. Has tried purple box starts with an A. Tried gina menopause.   Takes depakote TID per her report, sees psych. Xanax as well.   ROS: Denies CP, SOB, abdominal pain, dysuria, changes in BMs.   CC, SH/smoking status, and VS noted  Objective: BP 130/78   Pulse 79   Temp 98 F (36.7 C) (Oral)   Ht 6\' 3"  (1.905 m)   Wt 254 lb 3.2 oz (115.3 kg)   SpO2 98%   BMI 31.77 kg/m  Gen: NAD, alert, cooperative, and pleasant. HEENT: NCAT, EOMI, PERRL CV: RRR, no murmur Resp: CTAB, no wheezes, non-labored Ext: No edema, warm, bilateral knees with significant crepitus on ROM exam and narrowed joint spaces medially. L knee with ligaments in tact to varus and valgus testing.  Neuro: Alert and oriented, Speech clear, No gross deficits  Assessment and plan:  Osteoarthritis of knee Pt reports she knows she needs TKR on R. L knee with additional pain despite steroid injection 1 month ago. We will do standing films to assess degree of OA. Add venlafaxine for both pain and hot flashes as above, continue voltaren gel. rx PT to help with strengthening even in the event of TKR as pre-rehab has been shown to be helpful.   Vasomotor symptoms due to menopause Although patient is bipolar on depakote, she reports mood stability and follows with her psychiatrist. We will start low dose venlafaxine for hot flash sxs as she has  tried OTC options. She will inform her psychiatrist at their appt tomorrow. REcheck in 1 month.    Orders Placed This Encounter  Procedures  . DG Knee Bilateral Standing AP    Standing Status:   Future    Standing Expiration Date:   06/21/2019    Order Specific Question:   Reason for Exam (SYMPTOM  OR DIAGNOSIS REQUIRED)    Answer:   knee oa    Order Specific Question:   Is patient pregnant?    Answer:   No    Order Specific Question:   Preferred imaging location?    Answer:   [facility]    Order Specific Question:   Radiology Contrast Protocol - do NOT remove file path    Answer:   [email or web address].pdf  . MM Digital Diagnostic Bilat    Standing Status:   Future    Standing Expiration Date:   06/21/2019    Order Specific Question:   Reason for Exam (SYMPTOM  OR DIAGNOSIS REQUIRED)    Answer:   screening for breast cancer    Order Specific Question:   Is the patient pregnant?    Answer:   No    Order Specific Question:   Preferred imaging location?    Answer:   [facility]  . Ambulatory referral to Physical Therapy    Referral Priority:   Routine  Referral Type:   Physical Medicine    Referral Reason:   Specialty Services Required    Requested Specialty:   Physical Therapy    Number of Visits Requested:   1  . Ambulatory referral to Gastroenterology    Referral Priority:   Routine    Referral Type:   Consultation    Referral Reason:   Specialty Services Required    Number of Visits Requested:   1    Meds ordered this encounter  Medications  . venlafaxine (EFFEXOR) 37.5 MG tablet    Sig: Take 1 tablet (37.5 mg total) by mouth 2 (two) times daily.    Dispense:  60 tablet    Refill:  0     Loni Muse, MD, PGY3 04/23/2018 1:50 PM

## 2018-04-22 NOTE — Patient Instructions (Signed)
It was a pleasure to see you today! Thank you for choosing [facility] for your primary care. [name] was seen for knee pain, hot flashes.   Our plans for today were:  Pick up the new medicine for hot flashes. It will also help with your knee pain. Please let Dr. Jeanie Sewer know we added this, call us or Dr. Jeanie Sewer if you feel like it is affecting your mood.   Go to [facility] to get [facility].   They will call you about mammogram and colonoscopy.   The physical therapy office should call you as well.    Best,  Dr. Chanetta Marshall

## 2018-04-23 DIAGNOSIS — N951 Menopausal and female climacteric states: Secondary | ICD-10-CM | POA: Insufficient documentation

## 2018-04-23 DIAGNOSIS — F3181 Bipolar II disorder: Secondary | ICD-10-CM | POA: Diagnosis not present

## 2018-04-23 DIAGNOSIS — M25569 Pain in unspecified knee: Secondary | ICD-10-CM | POA: Diagnosis not present

## 2018-04-23 NOTE — Assessment & Plan Note (Signed)
Pt reports she knows she needs TKR on R. L knee with additional pain despite steroid injection 1 month ago. We will do standing films to assess degree of OA. Add venlafaxine for both pain and hot flashes as above, continue voltaren gel. rx PT to help with strengthening even in the event of TKR as pre-rehab has been shown to be helpful.

## 2018-04-23 NOTE — Assessment & Plan Note (Signed)
Although patient is bipolar on depakote, she reports mood stability and follows with her psychiatrist. We will start low dose venlafaxine for hot flash sxs as she has tried OTC options. She will inform her psychiatrist at their appt tomorrow. REcheck in 1 month.

## 2018-04-24 DIAGNOSIS — M25569 Pain in unspecified knee: Secondary | ICD-10-CM | POA: Diagnosis not present

## 2018-04-25 DIAGNOSIS — M25569 Pain in unspecified knee: Secondary | ICD-10-CM | POA: Diagnosis not present

## 2018-04-26 DIAGNOSIS — M25569 Pain in unspecified knee: Secondary | ICD-10-CM | POA: Diagnosis not present

## 2018-04-27 DIAGNOSIS — M25569 Pain in unspecified knee: Secondary | ICD-10-CM | POA: Diagnosis not present

## 2018-04-28 DIAGNOSIS — M25569 Pain in unspecified knee: Secondary | ICD-10-CM | POA: Diagnosis not present

## 2018-04-29 DIAGNOSIS — M25569 Pain in unspecified knee: Secondary | ICD-10-CM | POA: Diagnosis not present

## 2018-04-30 DIAGNOSIS — M25569 Pain in unspecified knee: Secondary | ICD-10-CM | POA: Diagnosis not present

## 2018-05-01 ENCOUNTER — Other Ambulatory Visit: Payer: Self-pay | Admitting: Family Medicine

## 2018-05-01 DIAGNOSIS — M25569 Pain in unspecified knee: Secondary | ICD-10-CM | POA: Diagnosis not present

## 2018-05-02 DIAGNOSIS — M25569 Pain in unspecified knee: Secondary | ICD-10-CM | POA: Diagnosis not present

## 2018-05-03 DIAGNOSIS — M25569 Pain in unspecified knee: Secondary | ICD-10-CM | POA: Diagnosis not present

## 2018-05-04 ENCOUNTER — Telehealth: Payer: Self-pay | Admitting: Family Medicine

## 2018-05-04 DIAGNOSIS — M25569 Pain in unspecified knee: Secondary | ICD-10-CM | POA: Diagnosis not present

## 2018-05-04 NOTE — Telephone Encounter (Signed)
Call patient to ask about her knee pain and to see whether she indeed wants more meloxicam.  She says that she only takes the meloxicam every now and then for exacerbations in her knee pain and she is currently taking the venlafaxine and using a BenGay cream, which helped her pain.  I told her that I would rather not refill her meloxicam right now since this medication when taken chronically can increase the risk for kidney and GI damage.  Patient was understanding and said that she has plenty of meloxicam left and does not plan on taking it very frequently.

## 2018-05-05 DIAGNOSIS — M25569 Pain in unspecified knee: Secondary | ICD-10-CM | POA: Diagnosis not present

## 2018-05-06 DIAGNOSIS — M25569 Pain in unspecified knee: Secondary | ICD-10-CM | POA: Diagnosis not present

## 2018-05-07 DIAGNOSIS — M25569 Pain in unspecified knee: Secondary | ICD-10-CM | POA: Diagnosis not present

## 2018-05-08 DIAGNOSIS — M25569 Pain in unspecified knee: Secondary | ICD-10-CM | POA: Diagnosis not present

## 2018-05-09 DIAGNOSIS — M25569 Pain in unspecified knee: Secondary | ICD-10-CM | POA: Diagnosis not present

## 2018-05-10 DIAGNOSIS — M25569 Pain in unspecified knee: Secondary | ICD-10-CM | POA: Diagnosis not present

## 2018-05-11 DIAGNOSIS — M25569 Pain in unspecified knee: Secondary | ICD-10-CM | POA: Diagnosis not present

## 2018-05-12 DIAGNOSIS — M25569 Pain in unspecified knee: Secondary | ICD-10-CM | POA: Diagnosis not present

## 2018-05-13 DIAGNOSIS — M25569 Pain in unspecified knee: Secondary | ICD-10-CM | POA: Diagnosis not present

## 2018-05-14 DIAGNOSIS — M25569 Pain in unspecified knee: Secondary | ICD-10-CM | POA: Diagnosis not present

## 2018-05-15 DIAGNOSIS — M25569 Pain in unspecified knee: Secondary | ICD-10-CM | POA: Diagnosis not present

## 2018-05-16 DIAGNOSIS — M25569 Pain in unspecified knee: Secondary | ICD-10-CM | POA: Diagnosis not present

## 2018-05-17 DIAGNOSIS — M25569 Pain in unspecified knee: Secondary | ICD-10-CM | POA: Diagnosis not present

## 2018-05-18 DIAGNOSIS — M25569 Pain in unspecified knee: Secondary | ICD-10-CM | POA: Diagnosis not present

## 2018-05-19 DIAGNOSIS — M25569 Pain in unspecified knee: Secondary | ICD-10-CM | POA: Diagnosis not present

## 2018-05-20 DIAGNOSIS — M25569 Pain in unspecified knee: Secondary | ICD-10-CM | POA: Diagnosis not present

## 2018-05-21 DIAGNOSIS — M25569 Pain in unspecified knee: Secondary | ICD-10-CM | POA: Diagnosis not present

## 2018-05-22 DIAGNOSIS — M25569 Pain in unspecified knee: Secondary | ICD-10-CM | POA: Diagnosis not present

## 2018-05-23 DIAGNOSIS — M25569 Pain in unspecified knee: Secondary | ICD-10-CM | POA: Diagnosis not present

## 2018-05-24 DIAGNOSIS — M25569 Pain in unspecified knee: Secondary | ICD-10-CM | POA: Diagnosis not present

## 2018-05-25 DIAGNOSIS — M25569 Pain in unspecified knee: Secondary | ICD-10-CM | POA: Diagnosis not present

## 2018-05-26 DIAGNOSIS — M25569 Pain in unspecified knee: Secondary | ICD-10-CM | POA: Diagnosis not present

## 2018-05-27 DIAGNOSIS — M25569 Pain in unspecified knee: Secondary | ICD-10-CM | POA: Diagnosis not present

## 2018-05-28 ENCOUNTER — Encounter: Payer: Self-pay | Admitting: Family Medicine

## 2018-05-28 DIAGNOSIS — M25569 Pain in unspecified knee: Secondary | ICD-10-CM | POA: Diagnosis not present

## 2018-05-29 DIAGNOSIS — M25569 Pain in unspecified knee: Secondary | ICD-10-CM | POA: Diagnosis not present

## 2018-05-30 DIAGNOSIS — M25569 Pain in unspecified knee: Secondary | ICD-10-CM | POA: Diagnosis not present

## 2018-05-31 DIAGNOSIS — M25569 Pain in unspecified knee: Secondary | ICD-10-CM | POA: Diagnosis not present

## 2018-06-01 DIAGNOSIS — M25569 Pain in unspecified knee: Secondary | ICD-10-CM | POA: Diagnosis not present

## 2018-06-02 DIAGNOSIS — M25569 Pain in unspecified knee: Secondary | ICD-10-CM | POA: Diagnosis not present

## 2018-06-03 ENCOUNTER — Ambulatory Visit (INDEPENDENT_AMBULATORY_CARE_PROVIDER_SITE_OTHER): Payer: Medicaid Other | Admitting: Family Medicine

## 2018-06-03 ENCOUNTER — Other Ambulatory Visit: Payer: Self-pay | Admitting: Family Medicine

## 2018-06-03 ENCOUNTER — Other Ambulatory Visit: Payer: Self-pay

## 2018-06-03 ENCOUNTER — Encounter: Payer: Self-pay | Admitting: Family Medicine

## 2018-06-03 VITALS — BP 139/80 | HR 81 | Temp 98.1°F | Wt 256.0 lb

## 2018-06-03 DIAGNOSIS — M25569 Pain in unspecified knee: Secondary | ICD-10-CM | POA: Diagnosis not present

## 2018-06-03 DIAGNOSIS — K1379 Other lesions of oral mucosa: Secondary | ICD-10-CM

## 2018-06-03 DIAGNOSIS — M1712 Unilateral primary osteoarthritis, left knee: Secondary | ICD-10-CM

## 2018-06-03 MED ORDER — AMOXICILLIN 500 MG PO CAPS: 500.0000 mg | ORAL_CAPSULE | Freq: Three times a day (TID) | ORAL | 0 refills | Status: DC

## 2018-06-03 MED ORDER — METHYLPREDNISOLONE ACETATE 40 MG/ML IJ SUSP
40.0000 mg | Freq: Once | INTRAMUSCULAR | Status: AC
  Administered 2018-06-03: 40 mg via INTRAMUSCULAR

## 2018-06-04 DIAGNOSIS — M25569 Pain in unspecified knee: Secondary | ICD-10-CM | POA: Diagnosis not present

## 2018-06-05 DIAGNOSIS — M25569 Pain in unspecified knee: Secondary | ICD-10-CM | POA: Diagnosis not present

## 2018-06-05 MED ORDER — AMOXICILLIN 500 MG PO CAPS: 500.0000 mg | ORAL_CAPSULE | Freq: Three times a day (TID) | ORAL | 0 refills | Status: AC

## 2018-06-05 NOTE — Assessment & Plan Note (Signed)
Patient with significant established osteoarthritis.  Has had therapeutic steroid injections in the knees in the past.  Says she is following with Dewaine Conger for potential knee replacement on right side.  Complains of effusion and swelling on left side today.  On physical exam there is minor effusion, no warmth, no redness she is able to bend the knee to 90 degrees without any significant pain.  No sign of infection at this time.  Steroid injection offered  Knee Injection: Written and verbal consent was obtained after discussing the risks and benefits of the procedure with the patient. The anterior knee was cleansed in a sterile fashion with betadine. 40 mg Depo-medrol and 3 cc 1% Lidocaine was injected using an anteromedial approach using a 5 cc syringe and 25 gauge 11/2 in needle. No complications were encountered. Minimal blood loss. A band aid was applied.

## 2018-06-05 NOTE — Assessment & Plan Note (Signed)
>>  ASSESSMENT AND PLAN FOR KNEE PAIN, RIGHT, CHRONIC WRITTEN ON 02/26/2022  5:39 PM BY [name], MD  >>ASSESSMENT AND PLAN FOR ARTHRITIS OF KNEE, DEGENERATIVE WRITTEN ON 06/05/2018  8:52 AM BY BLAND, SCOTT, DO  Patient with significant established osteoarthritis.  Has had therapeutic steroid injections in the knees in the past.  Says she is following with Dewaine Conger for potential knee replacement on right side.  Complains of effusion and swelling on left side today.  On physical exam there is minor effusion, no warmth, no redness she is able to bend the knee to 90 degrees without any significant pain.  No sign of infection at this time.  Steroid injection offered  Knee Injection: Written and verbal consent was obtained after discussing the risks and benefits of the procedure with the patient. The anterior knee was cleansed in a sterile fashion with betadine. 40 mg Depo-medrol and 3 cc 1% Lidocaine was injected using an anteromedial approach using a 5 cc syringe and 25 gauge 11/2 in needle. No complications were encountered. Minimal blood loss. A band aid was applied.

## 2018-06-05 NOTE — Progress Notes (Signed)
    Subjective:  [name] is a 53 y.o. female who presents to the [facility] today with a chief complaint of knee pain.   HPI: Mouth pain Patient with broken tooth on upper left side.  This is a old chronic issue and she is yet to go see a dentist for it.  There does not appear to be any sign of abscess, no impact on airway at this time, patient has not been febrile.  It is painful   She has been instructed again that the most appropriate care for this is to go see a dentist.    Arthritis of knee, degenerative Patient with significant established osteoarthritis.  Has had therapeutic steroid injections in the knees in the past.  Says she is following with Dewaine Conger for potential knee replacement on right side.  Complains of effusion and swelling on left side today.  Objective:  Physical Exam: BP 139/80   Pulse 81   Temp 98.1 F (36.7 C) (Oral)   Wt 256 lb (116.1 kg)   SpO2 98%   BMI 32.00 kg/m   Gen: NAD, resting comfortably CV: RRR with no murmurs appreciated Mouth: Upper left tooth is broken off, there is minor sign of infection around gingiva, but no swelling, no extension into entire gum, no submandibular swelling or concern for airway at this time, no stridor Pulm: NWOB, CTAB with no crackles, wheezes, or rhonchi GI: Normal bowel sounds present. Soft, Nontender, Nondistended. MSK: Left knee is minimally asymmetrically swollen over right there is no redness there is no warmth on palpation compared.  There is some minor edema noted suprapatellar and potentially Baker's cyst posterior.  There is not appear to be enough fluid to justify draining at this time. Skin: warm, dry Neuro: grossly normal, moves all extremities Psych: Normal affect and thought content  No results found for this or any previous visit (from the past 72 hour(s)).   Assessment/Plan:  Mouth pain Patient with broken tooth on upper left side.  This is a old chronic issue and she is yet to go see a dentist  for it.  There does not appear to be any sign of abscess, no impact on airway at this time, patient has not been febrile.  It is painful but appears stable.  She has been instructed again that the most appropriate care for this is to go see a dentist.  Did write for course of amoxicillin, can use over-the-counter ibuprofen for pain  Arthritis of knee, degenerative Patient with significant established osteoarthritis.  Has had therapeutic steroid injections in the knees in the past.  Says she is following with Dewaine Conger for potential knee replacement on right side.  Complains of effusion and swelling on left side today.  On physical exam there is minor effusion, no warmth, no redness she is able to bend the knee to 90 degrees without any significant pain.  No sign of infection at this time.  Steroid injection offered  Knee Injection: Written and verbal consent was obtained after discussing the risks and benefits of the procedure with the patient. The anterior knee was cleansed in a sterile fashion with betadine. 40 mg Depo-medrol and 3 cc 1% Lidocaine was injected using an anteromedial approach using a 5 cc syringe and 25 gauge 11/2 in needle. No complications were encountered. Minimal blood loss. A band aid was applied.    Marthenia Rolling, DO FAMILY MEDICINE RESIDENT - PGY2 06/05/2018 8:53 AM

## 2018-06-05 NOTE — Addendum Note (Signed)
Addended by: Marthenia Rolling R on: 06/05/2018 10:23 AM   Modules accepted: Orders

## 2018-06-05 NOTE — Assessment & Plan Note (Signed)
Patient with broken tooth on upper left side.  This is a old chronic issue and she is yet to go see a dentist for it.  There does not appear to be any sign of abscess, no impact on airway at this time, patient has not been febrile.  It is painful but appears stable.  She has been instructed again that the most appropriate care for this is to go see a dentist.  Did write for course of amoxicillin, can use over-the-counter ibuprofen for pain

## 2018-06-05 NOTE — Assessment & Plan Note (Signed)
>>  ASSESSMENT AND PLAN FOR ARTHRITIS OF KNEE, DEGENERATIVE WRITTEN ON 06/05/2018  8:52 AM BY BLAND, SCOTT, DO  Patient with significant established osteoarthritis.  Has had therapeutic steroid injections in the knees in the past.  Says she is following with Dewaine Conger for potential knee replacement on right side.  Complains of effusion and swelling on left side today.  On physical exam there is minor effusion, no warmth, no redness she is able to bend the knee to 90 degrees without any significant pain.  No sign of infection at this time.  Steroid injection offered  Knee Injection: Written and verbal consent was obtained after discussing the risks and benefits of the procedure with the patient. The anterior knee was cleansed in a sterile fashion with betadine. 40 mg Depo-medrol and 3 cc 1% Lidocaine was injected using an anteromedial approach using a 5 cc syringe and 25 gauge 11/2 in needle. No complications were encountered. Minimal blood loss. A band aid was applied.

## 2018-06-06 DIAGNOSIS — M25569 Pain in unspecified knee: Secondary | ICD-10-CM | POA: Diagnosis not present

## 2018-06-07 DIAGNOSIS — M25569 Pain in unspecified knee: Secondary | ICD-10-CM | POA: Diagnosis not present

## 2018-06-08 DIAGNOSIS — M25569 Pain in unspecified knee: Secondary | ICD-10-CM | POA: Diagnosis not present

## 2018-06-09 DIAGNOSIS — M25569 Pain in unspecified knee: Secondary | ICD-10-CM | POA: Diagnosis not present

## 2018-06-10 DIAGNOSIS — M25569 Pain in unspecified knee: Secondary | ICD-10-CM | POA: Diagnosis not present

## 2018-06-11 DIAGNOSIS — M25569 Pain in unspecified knee: Secondary | ICD-10-CM | POA: Diagnosis not present

## 2018-06-12 DIAGNOSIS — M25569 Pain in unspecified knee: Secondary | ICD-10-CM | POA: Diagnosis not present

## 2018-06-13 DIAGNOSIS — M25569 Pain in unspecified knee: Secondary | ICD-10-CM | POA: Diagnosis not present

## 2018-06-14 DIAGNOSIS — M25569 Pain in unspecified knee: Secondary | ICD-10-CM | POA: Diagnosis not present

## 2018-06-15 ENCOUNTER — Telehealth (INDEPENDENT_AMBULATORY_CARE_PROVIDER_SITE_OTHER): Payer: Medicaid Other | Admitting: Family Medicine

## 2018-06-15 ENCOUNTER — Ambulatory Visit: Payer: Medicaid Other

## 2018-06-15 DIAGNOSIS — M25569 Pain in unspecified knee: Secondary | ICD-10-CM | POA: Diagnosis not present

## 2018-06-15 DIAGNOSIS — M25562 Pain in left knee: Secondary | ICD-10-CM

## 2018-06-15 NOTE — Assessment & Plan Note (Signed)
>>  ASSESSMENT AND PLAN FOR LEFT KNEE PAIN WRITTEN ON 06/15/2018 10:53 AM BY Lezlie Octave C, MD  Patient's symptoms are likely related to her chronic left knee osteoarthritis, but since she mentions swelling and some erythema at her thigh, we should evaluate this in person to see if the symptoms are concerning for DVT.  Since her left knee is not erythematous, this is unlikely septic arthritis, although that is a concern given her the injection 2 weeks ago.  I advised patient that an anti-inflammatory medicine is better suited for her pain that is narcotic medication, since her pain is likely due to inflammation.  Ever, since she says that meloxicam does not work for her, I do not feel comfortable offering her to continue taking this medication currently.  I have made an appointment for patient to see [facility] this afternoon for further evaluation of her leg pain, swelling, and erythema.

## 2018-06-15 NOTE — Telephone Encounter (Signed)
[facility] Telemedicine Visit  Patient consented to have visit conducted via telephone.  Encounter participants: Patient: [name]  Provider: Lennox Solders  Others (if applicable): none  Chief Complaint: left leg pain and swelling  HPI: - duration of about 1 to 2 weeks - received a corticosteroid injection on 3/11 which she tolerated well - has tried Advil and heat without any relief - swelling includes area from L groin to below L knee - has associated erythema on her L thigh but no redness on her L knee -When asked if she has any meloxicam, patient also says that she has tried this without much relief -Says that this is happened to her before, and Vicodin was helpful for her pain at that point   ROS: Review of Systems - Negative except as mentioned in HPI   Pertinent PMHx: Osteoarthritis of bilateral knees, bipolar disorder, anxiety, tobacco abuse, hidradenitis suppurativa, chronic low back pain  Assessment/Plan:  Left knee pain Patient's symptoms are likely related to her chronic left knee osteoarthritis, but since she mentions swelling and some erythema at her thigh, we should evaluate this in person to see if the symptoms are concerning for DVT.  Since her left knee is not erythematous, this is unlikely septic arthritis, although that is a concern given her the injection 2 weeks ago.  I advised patient that an anti-inflammatory medicine is better suited for her pain that is narcotic medication, since her pain is likely due to inflammation.  Ever, since she says that meloxicam does not work for her, I do not feel comfortable offering her to continue taking this medication currently.  I have made an appointment for patient to see [facility] this afternoon for further evaluation of her leg pain, swelling, and erythema.    Time spent on phone with patient: 13 minutes

## 2018-06-15 NOTE — Assessment & Plan Note (Signed)
Patient's symptoms are likely related to her chronic left knee osteoarthritis, but since she mentions swelling and some erythema at her thigh, we should evaluate this in person to see if the symptoms are concerning for DVT.  Since her left knee is not erythematous, this is unlikely septic arthritis, although that is a concern given her the injection 2 weeks ago.  I advised patient that an anti-inflammatory medicine is better suited for her pain that is narcotic medication, since her pain is likely due to inflammation.  Ever, since she says that meloxicam does not work for her, I do not feel comfortable offering her to continue taking this medication currently.  I have made an appointment for patient to see [facility] this afternoon for further evaluation of her leg pain, swelling, and erythema.

## 2018-06-16 ENCOUNTER — Ambulatory Visit: Payer: Self-pay

## 2018-06-16 ENCOUNTER — Telehealth: Payer: Self-pay | Admitting: Family Medicine

## 2018-06-16 DIAGNOSIS — M170 Bilateral primary osteoarthritis of knee: Secondary | ICD-10-CM

## 2018-06-16 DIAGNOSIS — M25569 Pain in unspecified knee: Secondary | ICD-10-CM | POA: Diagnosis not present

## 2018-06-16 MED ORDER — MELOXICAM 15 MG PO TABS: 15.0000 mg | ORAL_TABLET | Freq: Every day | ORAL | 0 refills | Status: DC

## 2018-06-16 NOTE — Telephone Encounter (Signed)
Patient was coming in to discuss her chronic knee pain.  She thinks the pain is consistent with her chronic issue and not new/infectious.  She wants a refill of her pain medications and would prefer to avoid risk of exposure in clinic.  She was advised to schedule with ortho and given return precautions to both our clinic and the ED  -Dr. Parke Simmers

## 2018-06-16 NOTE — Telephone Encounter (Signed)
I was preceptor the day of this visit.   

## 2018-06-17 DIAGNOSIS — M25569 Pain in unspecified knee: Secondary | ICD-10-CM | POA: Diagnosis not present

## 2018-06-18 DIAGNOSIS — M25569 Pain in unspecified knee: Secondary | ICD-10-CM | POA: Diagnosis not present

## 2018-06-19 DIAGNOSIS — M25569 Pain in unspecified knee: Secondary | ICD-10-CM | POA: Diagnosis not present

## 2018-06-20 DIAGNOSIS — M25569 Pain in unspecified knee: Secondary | ICD-10-CM | POA: Diagnosis not present

## 2018-06-21 DIAGNOSIS — M25569 Pain in unspecified knee: Secondary | ICD-10-CM | POA: Diagnosis not present

## 2018-06-22 DIAGNOSIS — M25569 Pain in unspecified knee: Secondary | ICD-10-CM | POA: Diagnosis not present

## 2018-06-23 DIAGNOSIS — M25569 Pain in unspecified knee: Secondary | ICD-10-CM | POA: Diagnosis not present

## 2018-06-24 DIAGNOSIS — M25569 Pain in unspecified knee: Secondary | ICD-10-CM | POA: Diagnosis not present

## 2018-06-25 DIAGNOSIS — M25569 Pain in unspecified knee: Secondary | ICD-10-CM | POA: Diagnosis not present

## 2018-06-26 DIAGNOSIS — M25569 Pain in unspecified knee: Secondary | ICD-10-CM | POA: Diagnosis not present

## 2018-06-27 DIAGNOSIS — M25569 Pain in unspecified knee: Secondary | ICD-10-CM | POA: Diagnosis not present

## 2018-06-28 DIAGNOSIS — M25569 Pain in unspecified knee: Secondary | ICD-10-CM | POA: Diagnosis not present

## 2018-06-29 DIAGNOSIS — M25569 Pain in unspecified knee: Secondary | ICD-10-CM | POA: Diagnosis not present

## 2018-06-30 DIAGNOSIS — M25569 Pain in unspecified knee: Secondary | ICD-10-CM | POA: Diagnosis not present

## 2018-07-01 DIAGNOSIS — M25569 Pain in unspecified knee: Secondary | ICD-10-CM | POA: Diagnosis not present

## 2018-07-02 DIAGNOSIS — M25569 Pain in unspecified knee: Secondary | ICD-10-CM | POA: Diagnosis not present

## 2018-07-03 DIAGNOSIS — M25569 Pain in unspecified knee: Secondary | ICD-10-CM | POA: Diagnosis not present

## 2018-07-04 DIAGNOSIS — M25569 Pain in unspecified knee: Secondary | ICD-10-CM | POA: Diagnosis not present

## 2018-07-05 DIAGNOSIS — M25569 Pain in unspecified knee: Secondary | ICD-10-CM | POA: Diagnosis not present

## 2018-07-06 DIAGNOSIS — M25569 Pain in unspecified knee: Secondary | ICD-10-CM | POA: Diagnosis not present

## 2018-07-07 DIAGNOSIS — M25569 Pain in unspecified knee: Secondary | ICD-10-CM | POA: Diagnosis not present

## 2018-07-08 ENCOUNTER — Telehealth (INDEPENDENT_AMBULATORY_CARE_PROVIDER_SITE_OTHER): Payer: Medicaid Other | Admitting: Family Medicine

## 2018-07-08 DIAGNOSIS — M25562 Pain in left knee: Secondary | ICD-10-CM | POA: Diagnosis not present

## 2018-07-08 DIAGNOSIS — M25561 Pain in right knee: Secondary | ICD-10-CM | POA: Diagnosis not present

## 2018-07-08 DIAGNOSIS — M25569 Pain in unspecified knee: Secondary | ICD-10-CM | POA: Diagnosis not present

## 2018-07-08 MED ORDER — TRAMADOL-ACETAMINOPHEN 37.5-325 MG PO TABS: 1.0000 | ORAL_TABLET | Freq: Four times a day (QID) | ORAL | 0 refills | Status: AC | PRN

## 2018-07-08 NOTE — Progress Notes (Signed)
[place] [facility] Telemedicine Visit  Patient consented to have virtual visit. Method of visit: Telephone  Encounter participants: Patient: [name] - located at home Provider: Renold Don - located at office Others (if applicable): n/a  Chief Complaint: knee pain  HPI:  Patient complaining of bilateral knee pain worse in the left knee.  Present for the past several weeks.  She states that it is "severely painful" she reports that she has been on hydrocodone for this in the past and this relieves her pain.  She states that she has recently been prescribed anti-inflammatories but that her knee is not swollen and therefore noninflamed she just "needs something for the pain."  She has been taking tramadol but states this does not work.  Pain is present when she attempts to ambulate or if she has been sitting for long period of time.  No recent injuries.  She does endorse increased anxiety because of the coronavirus.  She is staying at home with her support dog and home health nurse who comes out to check on her.  No history of her knee giving out.  No catching or locking.  No falls.    Through discussion she continually reiterates desire for vicodin, specifically asking for 10/325 as the 5/325 dosage was not helpful.  Provided by a physician "in [place]" 1-2 years ago without any more specificity.   ROS: per HPI  Pertinent PMHx: chronic BL knee pain   Exam:  Respiratory: Good strong voice.  Normal work of breathing. Psych: Became a little agitated during the exam but was able to be calmed.  No flight of ideas and coherent and linear thought process.  Assessment/Plan:  1.  Bilateral knee pain:  -she has leg imaging which reveals severe osteoarthritis of her right knee for 2017.  I would assume this is only worsened over time.  She is now complaining of left knee pain as well, more left than Right.   -She has not received much relief from corticosteroid  injections. -She denies any relief from anti-inflammatories. -After prolonged discussion with patient will attempt Ultracet as this has low-dose tramadol plus combination of Tylenol. -Stronger narcotics are not appropriate. Discussed this with patient.  Not happy but calm by end of visit.   -Sounds like she may ultimately need knee replacement.  Follow-up with orthopedics after coronavirus care has died down. **Limited to 5 day supply as new script for this year.    Time spent during visit with patient: 15 minutes

## 2018-07-09 DIAGNOSIS — M25569 Pain in unspecified knee: Secondary | ICD-10-CM | POA: Diagnosis not present

## 2018-07-10 DIAGNOSIS — M25569 Pain in unspecified knee: Secondary | ICD-10-CM | POA: Diagnosis not present

## 2018-07-11 DIAGNOSIS — M25569 Pain in unspecified knee: Secondary | ICD-10-CM | POA: Diagnosis not present

## 2018-07-12 ENCOUNTER — Other Ambulatory Visit: Payer: Self-pay | Admitting: Family Medicine

## 2018-07-12 DIAGNOSIS — M170 Bilateral primary osteoarthritis of knee: Secondary | ICD-10-CM

## 2018-07-12 DIAGNOSIS — M25569 Pain in unspecified knee: Secondary | ICD-10-CM | POA: Diagnosis not present

## 2018-07-13 ENCOUNTER — Other Ambulatory Visit: Payer: Self-pay | Admitting: Family Medicine

## 2018-07-13 DIAGNOSIS — M25569 Pain in unspecified knee: Secondary | ICD-10-CM | POA: Diagnosis not present

## 2018-07-14 DIAGNOSIS — M25569 Pain in unspecified knee: Secondary | ICD-10-CM | POA: Diagnosis not present

## 2018-07-15 DIAGNOSIS — M25569 Pain in unspecified knee: Secondary | ICD-10-CM | POA: Diagnosis not present

## 2018-07-16 DIAGNOSIS — M25569 Pain in unspecified knee: Secondary | ICD-10-CM | POA: Diagnosis not present

## 2018-07-17 DIAGNOSIS — M25569 Pain in unspecified knee: Secondary | ICD-10-CM | POA: Diagnosis not present

## 2018-07-18 DIAGNOSIS — M25569 Pain in unspecified knee: Secondary | ICD-10-CM | POA: Diagnosis not present

## 2018-07-19 DIAGNOSIS — M25569 Pain in unspecified knee: Secondary | ICD-10-CM | POA: Diagnosis not present

## 2018-07-20 DIAGNOSIS — M25569 Pain in unspecified knee: Secondary | ICD-10-CM | POA: Diagnosis not present

## 2018-07-21 DIAGNOSIS — M25569 Pain in unspecified knee: Secondary | ICD-10-CM | POA: Diagnosis not present

## 2018-07-22 DIAGNOSIS — M25569 Pain in unspecified knee: Secondary | ICD-10-CM | POA: Diagnosis not present

## 2018-07-23 DIAGNOSIS — M25569 Pain in unspecified knee: Secondary | ICD-10-CM | POA: Diagnosis not present

## 2018-07-24 DIAGNOSIS — M25569 Pain in unspecified knee: Secondary | ICD-10-CM | POA: Diagnosis not present

## 2018-07-25 DIAGNOSIS — M25569 Pain in unspecified knee: Secondary | ICD-10-CM | POA: Diagnosis not present

## 2018-07-26 DIAGNOSIS — M25569 Pain in unspecified knee: Secondary | ICD-10-CM | POA: Diagnosis not present

## 2018-07-27 DIAGNOSIS — M25569 Pain in unspecified knee: Secondary | ICD-10-CM | POA: Diagnosis not present

## 2018-07-28 DIAGNOSIS — M25569 Pain in unspecified knee: Secondary | ICD-10-CM | POA: Diagnosis not present

## 2018-07-29 DIAGNOSIS — M25569 Pain in unspecified knee: Secondary | ICD-10-CM | POA: Diagnosis not present

## 2018-07-30 DIAGNOSIS — M25569 Pain in unspecified knee: Secondary | ICD-10-CM | POA: Diagnosis not present

## 2018-07-31 DIAGNOSIS — M25569 Pain in unspecified knee: Secondary | ICD-10-CM | POA: Diagnosis not present

## 2018-08-01 DIAGNOSIS — M25569 Pain in unspecified knee: Secondary | ICD-10-CM | POA: Diagnosis not present

## 2018-08-02 DIAGNOSIS — M25569 Pain in unspecified knee: Secondary | ICD-10-CM | POA: Diagnosis not present

## 2018-08-03 ENCOUNTER — Telehealth (INDEPENDENT_AMBULATORY_CARE_PROVIDER_SITE_OTHER): Payer: Medicaid Other | Admitting: Family Medicine

## 2018-08-03 ENCOUNTER — Other Ambulatory Visit: Payer: Self-pay

## 2018-08-03 DIAGNOSIS — M25569 Pain in unspecified knee: Secondary | ICD-10-CM | POA: Diagnosis not present

## 2018-08-03 DIAGNOSIS — R0981 Nasal congestion: Secondary | ICD-10-CM | POA: Insufficient documentation

## 2018-08-03 HISTORY — DX: Nasal congestion: R09.81

## 2018-08-03 MED ORDER — FLUTICASONE PROPIONATE 50 MCG/ACT NA SUSP
2.0000 | Freq: Every day | NASAL | 6 refills | Status: DC
Start: 2018-08-03 — End: 2019-01-07

## 2018-08-03 NOTE — Progress Notes (Signed)
[facility] Telemedicine Visit  Patient consented to have virtual visit. Method of visit: Telephone, patient declined video visit  Encounter participants: Patient: [name] - located at home Provider: Unknown Jim - located at [facility] Others (if applicable): Discussed with Dr. Durwin Nora  Chief Complaint: Congestion  HPI: Patient reports productive cough with yellow sputum and yellow mucus in her nose and ears off and on for the last 2 months, which worsened today.  She notes some congestion, denies sinus pressure.  States that she "has a lot of mold in my apartment for a long time."  She denies any shortness of breath.  States that she does have anxiety and has been wearing a mask in her house, but still feels that she has been exposed to the mold.  She states that she is called the city and has attempted to be able to move, but has been unsuccessful at this time.  She reports a subjective fever this morning which she says resolved with placing an onion in her room.  She also states that when she blows her nose, she occasionally sees black come out.  She denies any hemoptysis.  She denies any chest pain.  ROS: per HPI  Pertinent PMHx: No history of diabetes  Exam:  Respiratory: Speaking in complete sentences and no evidence of respiratory distress over the phone  Assessment/Plan:  Congestion of nasal sinus No history of diabetes to increased risk of Mucor.  Patient does not report any respiratory symptoms and is speaking in complete sentences over the phone without evidence of respiratory distress.  She is only had a subjective fever this a.m.  Do not expect that she is having an allergic reaction to the mold being that this has been an ongoing exposure.  Given that today seems to be the worst, would start with Flonase.  Patient given strict return precautions and advised to call back should she not have improvement within the next week.  Patient advised to  be seen if she has shortness of breath with inability to speak in complete sentences without experiencing shortness of breath, chest pain.  Patient voiced understanding of this.  Patient also advised that she needs to move from her current apartment.  Patient advised that if she has shortness of breath, she should leave her apartment. -Flonase twice daily -Return precautions discussed    Time spent during visit with patient: 12 minutes

## 2018-08-03 NOTE — Assessment & Plan Note (Signed)
No history of diabetes to increased risk of Mucor.  Patient does not report any respiratory symptoms and is speaking in complete sentences over the phone without evidence of respiratory distress.  She is only had a subjective fever this a.m.  Do not expect that she is having an allergic reaction to the mold being that this has been an ongoing exposure.  Given that today seems to be the worst, would start with Flonase.  Patient given strict return precautions and advised to call back should she not have improvement within the next week.  Patient advised to be seen if she has shortness of breath with inability to speak in complete sentences without experiencing shortness of breath, chest pain.  Patient voiced understanding of this.  Patient also advised that she needs to move from her current apartment.  Patient advised that if she has shortness of breath, she should leave her apartment. -Flonase twice daily -Return precautions discussed

## 2018-08-04 DIAGNOSIS — M25569 Pain in unspecified knee: Secondary | ICD-10-CM | POA: Diagnosis not present

## 2018-08-05 DIAGNOSIS — M25569 Pain in unspecified knee: Secondary | ICD-10-CM | POA: Diagnosis not present

## 2018-08-06 DIAGNOSIS — M25569 Pain in unspecified knee: Secondary | ICD-10-CM | POA: Diagnosis not present

## 2018-08-07 DIAGNOSIS — M25569 Pain in unspecified knee: Secondary | ICD-10-CM | POA: Diagnosis not present

## 2018-08-08 DIAGNOSIS — M25569 Pain in unspecified knee: Secondary | ICD-10-CM | POA: Diagnosis not present

## 2018-08-09 DIAGNOSIS — M25569 Pain in unspecified knee: Secondary | ICD-10-CM | POA: Diagnosis not present

## 2018-08-10 DIAGNOSIS — M25569 Pain in unspecified knee: Secondary | ICD-10-CM | POA: Diagnosis not present

## 2018-08-11 ENCOUNTER — Telehealth: Payer: Self-pay | Admitting: *Deleted

## 2018-08-11 DIAGNOSIS — M25569 Pain in unspecified knee: Secondary | ICD-10-CM | POA: Diagnosis not present

## 2018-08-11 NOTE — Telephone Encounter (Signed)
Patient calls because she states that the flonase is not helping and now she is having trouble sleeping.   She is also requesting a refill on her pain meds (nothing on med list)  Jone Baseman, CMA

## 2018-08-12 ENCOUNTER — Telehealth: Payer: Self-pay | Admitting: Family Medicine

## 2018-08-12 ENCOUNTER — Telehealth: Payer: Medicaid Other | Admitting: Family Medicine

## 2018-08-12 ENCOUNTER — Other Ambulatory Visit: Payer: Self-pay

## 2018-08-12 DIAGNOSIS — M25569 Pain in unspecified knee: Secondary | ICD-10-CM | POA: Diagnosis not present

## 2018-08-12 NOTE — Telephone Encounter (Signed)
Spoke with patient.  She does not have wifi for video. Feels like she has an chest infection with shortness of breath and yellow sputum due to mold.  Also has new swelling of her feet. No chest pain or fever Does not feel shortness of breath at rest just mild when walks around  Recommend come in for a visit since needs vitals and exam and perhaps labs. To go to ER if worsening She agrees  Appointment tomorrow at 150 in [facility]

## 2018-08-13 ENCOUNTER — Other Ambulatory Visit: Payer: Self-pay

## 2018-08-13 ENCOUNTER — Ambulatory Visit (INDEPENDENT_AMBULATORY_CARE_PROVIDER_SITE_OTHER): Payer: Medicaid Other | Admitting: Family Medicine

## 2018-08-13 VITALS — BP 134/78 | HR 75 | Wt 261.0 lb

## 2018-08-13 DIAGNOSIS — R062 Wheezing: Secondary | ICD-10-CM | POA: Insufficient documentation

## 2018-08-13 DIAGNOSIS — R0981 Nasal congestion: Secondary | ICD-10-CM | POA: Diagnosis not present

## 2018-08-13 DIAGNOSIS — Z77120 Contact with and (suspected) exposure to mold (toxic): Secondary | ICD-10-CM | POA: Diagnosis not present

## 2018-08-13 DIAGNOSIS — M25569 Pain in unspecified knee: Secondary | ICD-10-CM | POA: Diagnosis not present

## 2018-08-13 MED ORDER — CETIRIZINE HCL 10 MG PO TABS: 10.0000 mg | ORAL_TABLET | Freq: Every day | ORAL | 11 refills | Status: DC

## 2018-08-13 MED ORDER — ALBUTEROL SULFATE HFA 108 (90 BASE) MCG/ACT IN AERS: 2.0000 | INHALATION_SPRAY | RESPIRATORY_TRACT | 0 refills | Status: DC | PRN

## 2018-08-13 MED ORDER — PSEUDOEPHEDRINE HCL 60 MG PO TABS: 60.0000 mg | ORAL_TABLET | ORAL | 0 refills | Status: AC | PRN

## 2018-08-13 NOTE — Telephone Encounter (Signed)
It appears that patient spoke with Dr. Deirdre Priest yesterday and has an appointment today.  Will hold off on calling pt unless her needs are not addressed in today's visit.

## 2018-08-13 NOTE — Assessment & Plan Note (Addendum)
Secondary to smoking vs. ?mold exposure. Likely multifactorial. No heard on exam.  - trial albuterol inhaler - patient to have PFTs scheduled if has improvement to inhaler use.

## 2018-08-13 NOTE — Assessment & Plan Note (Signed)
Nasal congestion, ?secondary to mold exposure. No other infectious symptoms. not improved by nasal steroids.  - Trial of decongestant with antihistamine for 7 days.  - May continue on antistamine if improved.

## 2018-08-13 NOTE — Patient Instructions (Signed)
Postnasal Drip  Postnasal drip is the feeling of mucus going down the back of your throat. Mucus is a slimy substance that moistens and cleans your nose and throat, as well as the air pockets in face bones near your forehead and cheeks (sinuses). Small amounts of mucus pass from your nose and sinuses down the back of your throat all the time. This is normal. When you produce too much mucus or the mucus gets too thick, you can feel it.  Some common causes of postnasal drip include:   Having more mucus because of:  ? A cold or the flu.  ? Allergies.  ? Cold air.  ? Certain medicines.   Having more mucus that is thicker because of:  ? A sinus or nasal infection.  ? Dry air.  ? A food allergy.  Follow these instructions at home:  Relieving discomfort     Gargle with a salt-water mixture 3-4 times a day or as needed. To make a salt-water mixture, completely dissolve -1 tsp of salt in 1 cup of warm water.   If the air in your home is dry, use a humidifier to add moisture to the air.   Use a saline spray or container (neti pot) to flush out the nose (nasal irrigation). These methods can help clear away mucus and keep the nasal passages moist.  General instructions   Take over-the-counter and prescription medicines only as told by your health care provider.   Follow instructions from your health care provider about eating or drinking restrictions. You may need to avoid caffeine.   Avoid things that you know you are allergic to (allergens), like dust, mold, pollen, pets, or certain foods.   Drink enough fluid to keep your urine pale yellow.   Keep all follow-up visits as told by your health care provider. This is important.  Contact a health care provider if:   You have a fever.   You have a sore throat.   You have difficulty swallowing.   You have headache.   You have sinus pain.   You have a cough that does not go away.   The mucus from your nose becomes thick and is green or yellow in color.   You have  cold or flu symptoms that last more than 10 days.  Summary   Postnasal drip is the feeling of mucus going down the back of your throat.   If your health care provider approves, use nasal irrigation or a nasal spray 2?4 times a day.   Avoid things that you know you are allergic to (allergens), like dust, mold, pollen, pets, or certain foods.  This information is not intended to replace advice given to you by your health care provider. Make sure you discuss any questions you have with your health care provider.  Document Released: 06/24/2016 Document Revised: 06/24/2016 Document Reviewed: 06/24/2016  Elsevier Interactive Patient Education  2019 [employer]

## 2018-08-13 NOTE — Progress Notes (Signed)
    Subjective:  [name] is a 53 y.o. female who presents to the [facility] today with a chief complaint of sob w/ recent mold exposure.   HPI: Patient has black mold at her home. She is experiencing chest congestion, nasal congestion. She says that the mold is coming out her vents, is in her walls. She can wipe it out of her ears. Patient got an attorney due to dispute with her landlord over not taking care of the probelm, and was adivsed to come see a physician for her symptoms. Patient has had house treated, but mold came back. No sick contacts. Patient has tried nasal spray flonase without improvement. Patient has tried sleeping with mask on her face, but is unable to due to "bad anxiety". Patient smokes 2 cigarrettes a day since 53 years old. Endorses wheezing at night at rest. Patient has symptoms only when she is at home. She does not have symptoms when outside the home.   ROS: No fevers, chills, no headaches, no recent travel, no abdominal pain, no chest pain  Objective:  Physical Exam: BP 134/78   Pulse 75   Wt 261 lb (118.4 kg)   SpO2 98%   BMI 32.62 kg/m   Gen: NAD, resting comfortably HEENT: No cervical lymphadenopathy, no nasal polyps, MMM, [place]/AT, no oropharyngeal lesions CV: RRR with no murmurs appreciated Pulm: NWOB, CTAB with no crackles, wheezes, or rhonchi Skin: warm, dry Neuro: grossly normal, moves all extremities   No results found for this or any previous visit (from the past 72 hour(s)).   Assessment/Plan:  Nasal congestion Nasal congestion, ?secondary to mold exposure. No other infectious symptoms. not improved by nasal steroids.  - Trial of decongestant with antihistamine for 7 days.  - May continue on antistamine if improved.   Wheezing Secondary to smoking vs. ?mold exposure. Likely multifactorial. No heard on exam.  - trial albuterol inhaler - patient to have PFTs scheduled if has improvement to inhaler use.    Lab Orders  No laboratory test(s)  ordered today    Meds ordered this encounter  Medications  . pseudoephedrine (SUDAFED) 60 MG tablet    Sig: Take 1 tablet (60 mg total) by mouth every 4 (four) hours as needed for up to 7 days for congestion.    Dispense:  30 tablet    Refill:  0  . cetirizine (ZYRTEC) 10 MG tablet    Sig: Take 1 tablet (10 mg total) by mouth daily.    Dispense:  30 tablet    Refill:  11  . albuterol (VENTOLIN HFA) 108 (90 Base) MCG/ACT inhaler    Sig: Inhale 2 puffs into the lungs every 4 (four) hours as needed for wheezing or shortness of breath.    Dispense:  1 Inhaler    Refill:  0    Thomes Dinning, MD, MS FAMILY MEDICINE RESIDENT - PGY2 08/13/2018 2:18 PM

## 2018-08-14 DIAGNOSIS — M25569 Pain in unspecified knee: Secondary | ICD-10-CM | POA: Diagnosis not present

## 2018-08-15 DIAGNOSIS — M25569 Pain in unspecified knee: Secondary | ICD-10-CM | POA: Diagnosis not present

## 2018-08-16 DIAGNOSIS — M25569 Pain in unspecified knee: Secondary | ICD-10-CM | POA: Diagnosis not present

## 2018-08-17 DIAGNOSIS — M25569 Pain in unspecified knee: Secondary | ICD-10-CM | POA: Diagnosis not present

## 2018-08-18 DIAGNOSIS — M25569 Pain in unspecified knee: Secondary | ICD-10-CM | POA: Diagnosis not present

## 2018-08-19 ENCOUNTER — Telehealth: Payer: Self-pay

## 2018-08-19 ENCOUNTER — Other Ambulatory Visit: Payer: Self-pay | Admitting: Family Medicine

## 2018-08-19 DIAGNOSIS — M170 Bilateral primary osteoarthritis of knee: Secondary | ICD-10-CM

## 2018-08-19 DIAGNOSIS — M25569 Pain in unspecified knee: Secondary | ICD-10-CM | POA: Diagnosis not present

## 2018-08-19 NOTE — Telephone Encounter (Signed)
Received fax from pharmacy requesting a new script for Tramadol. This is not on her current med list. Called patient to set up an apt and/or see if she requested this.

## 2018-08-20 DIAGNOSIS — M25569 Pain in unspecified knee: Secondary | ICD-10-CM | POA: Diagnosis not present

## 2018-08-20 NOTE — Telephone Encounter (Signed)
Informed pt of Dr. Frances Furbish message below.  She said she would hold off on the meloxicam for now. [name], CMA

## 2018-08-20 NOTE — Telephone Encounter (Signed)
Pt lm on nurse line wanting to know why meds were not filled.  Called back but had to LM for pt to call and schedule an appt. Jone Baseman, CMA

## 2018-08-20 NOTE — Telephone Encounter (Signed)
Thank you April!

## 2018-08-20 NOTE — Telephone Encounter (Signed)
I explained this in the pop up box that comes with declining medication refills, but I'm not sure if it can be seen.  Anyway, I refilled her Flexeril but not the meloxicam because meloxicam can cause kidney and stomach problems when taken long term.  She has said earlier that meloxicam does not work well for her, but if she would still like to take it, I would like for her to get her kidney function measured before continuing it since the last kidney function we have is from 2016. If she is agreeable to this, I will put the lab order in.  Thanks!

## 2018-08-21 DIAGNOSIS — M25569 Pain in unspecified knee: Secondary | ICD-10-CM | POA: Diagnosis not present

## 2018-08-22 DIAGNOSIS — M25569 Pain in unspecified knee: Secondary | ICD-10-CM | POA: Diagnosis not present

## 2018-08-23 DIAGNOSIS — M25569 Pain in unspecified knee: Secondary | ICD-10-CM | POA: Diagnosis not present

## 2018-08-24 DIAGNOSIS — M25569 Pain in unspecified knee: Secondary | ICD-10-CM | POA: Diagnosis not present

## 2018-08-25 DIAGNOSIS — M25569 Pain in unspecified knee: Secondary | ICD-10-CM | POA: Diagnosis not present

## 2018-08-26 DIAGNOSIS — Z79891 Long term (current) use of opiate analgesic: Secondary | ICD-10-CM | POA: Diagnosis not present

## 2018-08-26 DIAGNOSIS — F3181 Bipolar II disorder: Secondary | ICD-10-CM | POA: Diagnosis not present

## 2018-08-26 DIAGNOSIS — M25569 Pain in unspecified knee: Secondary | ICD-10-CM | POA: Diagnosis not present

## 2018-08-27 ENCOUNTER — Ambulatory Visit (INDEPENDENT_AMBULATORY_CARE_PROVIDER_SITE_OTHER): Payer: Medicaid Other | Admitting: Family Medicine

## 2018-08-27 ENCOUNTER — Other Ambulatory Visit: Payer: Self-pay

## 2018-08-27 VITALS — BP 140/90 | HR 94

## 2018-08-27 DIAGNOSIS — M25562 Pain in left knee: Secondary | ICD-10-CM | POA: Diagnosis not present

## 2018-08-27 DIAGNOSIS — M25569 Pain in unspecified knee: Secondary | ICD-10-CM | POA: Diagnosis not present

## 2018-08-27 MED ORDER — TRAMADOL HCL 50 MG PO TABS: 50.0000 mg | ORAL_TABLET | Freq: Three times a day (TID) | ORAL | 0 refills | Status: AC | PRN

## 2018-08-27 MED ORDER — METHYLPREDNISOLONE ACETATE 40 MG/ML IJ SUSP
40.0000 mg | Freq: Once | INTRAMUSCULAR | Status: AC
  Administered 2018-08-27: 40 mg via INTRA_ARTICULAR

## 2018-08-27 NOTE — Patient Instructions (Addendum)
Great meeting you today!  I believe your knee pain is due to your arthritis in that knee.  Your dog bumping into it likely major knee angry enough this hurting you a lot now.  We did an ultrasound which did not show significant fluid collection. We also looked at your meniscus which did not reveal any meniscus tears. We did a steroid injection to the knee. You can ice this area down if you are having some swelling later. I sent in for a 4 day supply of tramadol which should help with your acute pain.

## 2018-08-28 DIAGNOSIS — M25569 Pain in unspecified knee: Secondary | ICD-10-CM | POA: Diagnosis not present

## 2018-08-29 DIAGNOSIS — M25569 Pain in unspecified knee: Secondary | ICD-10-CM | POA: Diagnosis not present

## 2018-08-30 DIAGNOSIS — M25569 Pain in unspecified knee: Secondary | ICD-10-CM | POA: Diagnosis not present

## 2018-08-31 DIAGNOSIS — M25569 Pain in unspecified knee: Secondary | ICD-10-CM | POA: Diagnosis not present

## 2018-09-01 DIAGNOSIS — M25569 Pain in unspecified knee: Secondary | ICD-10-CM | POA: Diagnosis not present

## 2018-09-02 ENCOUNTER — Encounter: Payer: Self-pay | Admitting: Family Medicine

## 2018-09-02 DIAGNOSIS — M25569 Pain in unspecified knee: Secondary | ICD-10-CM | POA: Diagnosis not present

## 2018-09-02 NOTE — Assessment & Plan Note (Signed)
>>  ASSESSMENT AND PLAN FOR LEFT KNEE PAIN WRITTEN ON 09/02/2018  9:01 AM BY Myrene Buddy, MD  Given exam and ultrasound findings her pain is most likely consistent with left knee osteoarthritis.  She likely irritated her knee when her dog jumped into her leg causing inflammation.  Offered patient request her injection which she accepted.  Please see separate procedure note, patient did note immediate improvement of her pain after the injection.  Patient has obvious osteoarthritis on both exam and ultrasound, I am not sure that left knee x-ray would provide much benefit diagnostically at this point.  She will likely need a total knee replacement along with her right knee as well.  Gave 4-day supply of tramadol, explained that this is a temporary measure to help control her pain as the inflammation dies down and that she should not expect to get this refilled. -Tramadol 50 mg every 8 hours as needed for 4 days -Performed corticosteroid injection, 40 mg Depo-Medrol, 4 ml lidocaine. -Would likely benefit from some PT, but likely needs knee replacement at this point.

## 2018-09-02 NOTE — Progress Notes (Signed)
Family medicine procedure note  Procedure: Left knee corticosteroid injection Preprocedure diagnosis: Left knee osteoarthritis Procedure diagnosis: Left knee osteoarthritis Performing physician: Dr. Guadalupe Dawn Supervising physician: Dr. Dorris Singh  Procedure: Got written consent after explaining risk benefits, and alternatives to procedure.  Disinfected area with iodine swab x1, alcohol swab x2.  After allowing adequate time to dry, apply topical Biofreeze until area was numb.  Proceeded with corticosteroid/lidocaine injection (40 mg corticosteroid, 4 mL lidocaine) via standard medial approach.  Unable initially to advance needle, likely due to osteophyte.  Readjusted position and was able to slide needle easily into the joint space.  Very minimal amount of blood from injection site, applied bandage afterwards.  Patient can ice area as needed for swelling.  Hemostasis achieved prior to leaving room.  Patient states that she felt immediate relief of her symptoms and was able to walk out to the lobby.  Guadalupe Dawn MD PGY-2 Family Medicine Resident

## 2018-09-02 NOTE — Progress Notes (Signed)
HPI 53 year old female who presents for left knee pain.  She states that her knee started hurting approximately 2 days prior, she was walking her dog when her dog ran into her leg and caused her pain.  She has developed swelling in the knee since then.  She feels fullness in her knee.  She has known severe osteoarthritis in her right knee, but is never had her left knee imaged.  She did have a standing x-ray of the left knee ordered but was never performed.  She had corticosteroid injection in the right knee along with fluid aspiration and evaluation for gout.  Fluid was negative for gout at that time.   CC: Left knee pain   ROS:   Review of Systems See HPI for ROS.   CC, SH/smoking status, and VS noted  Objective: BP 140/90 (BP Location: Right Arm, Patient Position: Sitting, Cuff Size: Normal)   Pulse 94   SpO2 6996%  Gen: 53 year old African-American female, in mild distress due to knee pain CV: RRR, no murmur Resp: CTAB, no wheezes, non-labored Neuro: Alert and oriented, Speech clear, No gross deficits Left knee: Inspection: Obvious mild swelling in posterior medial lateral and anterior compartments. Palpation: Significant crepitus noted on extension and flexion of knee.  Notable joint line tenderness to palpation.  Notable fullness palpation in posterior knee.  Mild fluid shift noted anterior knee, less so in the medial and lateral knee. Range of motion: Able to fully extend knee to 180 degrees, has trouble flexing knee beyond 80 degrees.. Strength: 4/5 strength to extension and flexion knee Gait: Notable limp on left side.  Patient not placing weight on left side, use of cane noted Neurovascular: PT and DP intact.  Unable to palpate popliteal pulse given swelling.  Full sensation intact all distributions left leg. Special tests: Negative anterior, posterior drawer.  Negative McMurray, unable to full [place]ss[place] secondary to pain.  Ultrasound: Performed Limited ultrasound in room  under assistance from Dr. Laureen OchsPinney.  Numerous osteophytes seen along joint line, both tibial and femoral.  No obvious sign of meniscal tear.  Mild to moderate amount of fluid in anterior, medial, lateral, posterior compartments.  No cortical irregularity consistent with bone fracture.  Difficult to evaluate ligaments 2/2 swelling. No tendonous tears in patellar tendon distribution.  Assessment and plan:  Left knee pain Given exam and ultrasound findings her pain is most likely consistent with left knee osteoarthritis.  She likely irritated her knee when her dog jumped into her leg causing inflammation.  Offered patient request her injection which she accepted.  Please see separate procedure note, patient did note immediate improvement of her pain after the injection.  Patient has obvious osteoarthritis on both exam and ultrasound, I am not sure that left knee x-ray would provide much benefit diagnostically at this point.  She will likely need a total knee replacement along with her right knee as well.  Gave 4-day supply of tramadol, explained that this is a temporary measure to help control her pain as the inflammation dies down and that she should not expect to get this refilled. -Tramadol 50 mg every 8 hours as needed for 4 days -Performed corticosteroid injection, 40 mg Depo-Medrol, 4 ml lidocaine. -Would likely benefit from some PT, but likely needs knee replacement at this point.   No orders of the defined types were placed in this encounter.   Meds ordered this encounter  Medications  . traMADol (ULTRAM) 50 MG tablet    Sig: Take 1 tablet (  50 mg total) by mouth every 8 (eight) hours as needed for up to 4 days.    Dispense:  12 tablet    Refill:  0  . methylPREDNISolone acetate (DEPO-MEDROL) injection 40 mg     Guadalupe Dawn MD PGY-2 Family Medicine Resident  09/02/2018 9:01 AM

## 2018-09-02 NOTE — Assessment & Plan Note (Addendum)
Given exam and ultrasound findings her pain is most likely consistent with left knee osteoarthritis.  She likely irritated her knee when her dog jumped into her leg causing inflammation.  Offered patient request her injection which she accepted.  Please see separate procedure note, patient did note immediate improvement of her pain after the injection.  Patient has obvious osteoarthritis on both exam and ultrasound, I am not sure that left knee x-ray would provide much benefit diagnostically at this point.  She will likely need a total knee replacement along with her right knee as well.  Gave 4-day supply of tramadol, explained that this is a temporary measure to help control her pain as the inflammation dies down and that she should not expect to get this refilled. -Tramadol 50 mg every 8 hours as needed for 4 days -Performed corticosteroid injection, 40 mg Depo-Medrol, 4 ml lidocaine. -Would likely benefit from some PT, but likely needs knee replacement at this point.

## 2018-09-03 DIAGNOSIS — M25569 Pain in unspecified knee: Secondary | ICD-10-CM | POA: Diagnosis not present

## 2018-09-04 DIAGNOSIS — M25569 Pain in unspecified knee: Secondary | ICD-10-CM | POA: Diagnosis not present

## 2018-09-05 DIAGNOSIS — M25569 Pain in unspecified knee: Secondary | ICD-10-CM | POA: Diagnosis not present

## 2018-09-06 DIAGNOSIS — M25569 Pain in unspecified knee: Secondary | ICD-10-CM | POA: Diagnosis not present

## 2018-09-07 ENCOUNTER — Encounter: Payer: Self-pay | Admitting: Student in an Organized Health Care Education/Training Program

## 2018-09-07 ENCOUNTER — Other Ambulatory Visit: Payer: Self-pay

## 2018-09-07 ENCOUNTER — Ambulatory Visit (INDEPENDENT_AMBULATORY_CARE_PROVIDER_SITE_OTHER): Payer: Medicaid Other | Admitting: Student in an Organized Health Care Education/Training Program

## 2018-09-07 DIAGNOSIS — R062 Wheezing: Secondary | ICD-10-CM

## 2018-09-07 DIAGNOSIS — M25561 Pain in right knee: Secondary | ICD-10-CM | POA: Diagnosis not present

## 2018-09-07 DIAGNOSIS — G8929 Other chronic pain: Secondary | ICD-10-CM

## 2018-09-07 DIAGNOSIS — Z77120 Contact with and (suspected) exposure to mold (toxic): Secondary | ICD-10-CM

## 2018-09-07 DIAGNOSIS — M170 Bilateral primary osteoarthritis of knee: Secondary | ICD-10-CM | POA: Diagnosis not present

## 2018-09-07 DIAGNOSIS — M25569 Pain in unspecified knee: Secondary | ICD-10-CM | POA: Diagnosis not present

## 2018-09-07 MED ORDER — METHYLPREDNISOLONE ACETATE 80 MG/ML IJ SUSP
40.0000 mg | Freq: Once | INTRAMUSCULAR | Status: AC
  Administered 2018-09-07: 40 mg via INTRA_ARTICULAR

## 2018-09-07 MED ORDER — ALBUTEROL SULFATE HFA 108 (90 BASE) MCG/ACT IN AERS: 2.0000 | INHALATION_SPRAY | RESPIRATORY_TRACT | 0 refills | Status: DC | PRN

## 2018-09-07 MED ORDER — MELOXICAM 15 MG PO TABS: 15.0000 mg | ORAL_TABLET | Freq: Every day | ORAL | 0 refills | Status: DC

## 2018-09-07 MED ORDER — METHYLPREDNISOLONE ACETATE 80 MG/ML IJ SUSP: 80.0000 mg | Freq: Once | INTRAMUSCULAR | Status: DC

## 2018-09-07 NOTE — Progress Notes (Signed)
CC: Knee pain and mold in apartment  HPI: [name] is a 53 y.o. female with PMH significant for OA, GERD, bipolar disorder who presents to [facility] today in need of a right knee steroid injection and concerns about mold in her apartment affecting her breathing.   Right Knee pain Patient reports that she has OA in bilateral knees. She reports that she recently had a steroid injection on the right knee which she feels was helpful in conjunction with medication (she states she was given pills that were half tramadol and half percocet, however I only see tramadol and a prednisone injection ordered in her last visit for left knee OA).  Patient reports that she has right knee pain worse with activity. She has pain and stiffness when she first stands after sitting for a long time. She has not noticed swelling, redness, or warmth to the knee. She has no recent fevers.  Mold exposure/ respiratory issues Patient reports that she moved into her new apartment several months ago and she has experienced breathing issues since moving. She reports that she thinks there is black mold and she is in the process of having the apartment inspected. She endorses using an albuterol inhaler which was previously prescribed. She uses the inhaler multiple times per day and it helps. She has dyspnea, congestion, rhinorrhea, and sometimes wheezing. She becomes dyspneic with walking short distances, such as the waiting room to the exam room.  The patient denies having PFT's completed in the past. She attempted to take cetirizine but it made her feel very dizzy/light-headed. She has tried flonase but has not been using this consistently.  Review of Symptoms:  See HPI for ROS.   CC, SH/smoking status, and VS noted.  Objective: BP 125/80   Pulse 78   Wt 258 lb 6.4 oz (117.2 kg)   SpO2 99%   BMI 32.30 kg/m  GEN: NAD, alert, cooperative, and pleasant. RESPIRATORY: Comfortable work of breathing, speaks in full  sentences, clear to auscultation throughout without W/R/R CV: Regular rate noted, distal extremities well perfused and warm without edema GI: Soft, nondistended SKIN: warm and dry, no rashes or lesions NEURO: II-XII grossly intact MSK: Moves 4 extremities equally, right knee without erythema. + mild medial effusion appreciated. No warmth. Mild joint line tenderness. ROM full in flexion and extension. Ligaments with solid and consistent endpoints. Patellar and quad tendons unremarkable. PSYCH: AAOx3, appropriate affect   Knee Injection: Written and verbal consent was obtained after discussing the risks and benefits of the procedure with the patient. The anterior knee was cleansed in a sterile fashion with betadine. 40 mg Depo-medrol and 3 cc 1% Lidocaine was injected using an anterolateral approach using a 5 cc syringe and 25 gauge 11/2 in needle. No complications were encountered. Minimal blood loss. A band aid was applied.   Assessment and plan:  Right knee pain - no red flags for joint infection - 40 mg solumedrol right knee joint injection today in the office - prescribed meloxicam to use once daily PRN pain, when it runs out patient can switch to OTC ibuprofen - encourage continued movement at the joint - return precautions provided - follow up as needed  Suspected exposure to mold Patient reports that she is working with her apartment complex to have mold inspected and removed. She endorses improvement in breathing symptoms with albuterol. No evidence of volume overload or cardiac etiology of dyspnea. - refer to Dr. Valentina Lucks for PFT's - refilled albuterol  No orders of the defined types were placed in this encounter.   Meds ordered this encounter  Medications  . meloxicam (MOBIC) 15 MG tablet    Sig: Take 1 tablet (15 mg total) by mouth daily.    Dispense:  30 tablet    Refill:  0  . albuterol (VENTOLIN HFA) 108 (90 Base) MCG/ACT inhaler    Sig: Inhale 2 puffs into the lungs  every 4 (four) hours as needed for wheezing or shortness of breath.    Dispense:  1 Inhaler    Refill:  0  . DISCONTD: methylPREDNISolone acetate (DEPO-MEDROL) injection 80 mg  . methylPREDNISolone acetate (DEPO-MEDROL) injection 40 mg    Howard PouchLauren Amoreena Neubert, MD,MS,  PGY3 09/08/2018 11:10 AM

## 2018-09-07 NOTE — Patient Instructions (Signed)
It was a pleasure seeing you today in our clinic. Today we discussed your right knee pain and did a steroid injection. Here is the treatment plan we have discussed and agreed upon together:  - You may be sore over the next day, however please continue to move around so the joint does not get stiff. - Use meloxicam as needed for pain at home, do not take this with ibuprofen - If you notice an area of redness with warmth, swelling, pain, or you develop fevers, these would be reasons to call our office back or come in to be seen.  Our clinic's number is [phone]. Please call with questions or concerns about what we discussed today.  Be well, Dr. Burr Medico

## 2018-09-08 ENCOUNTER — Encounter: Payer: Self-pay | Admitting: Student in an Organized Health Care Education/Training Program

## 2018-09-08 DIAGNOSIS — M25569 Pain in unspecified knee: Secondary | ICD-10-CM | POA: Diagnosis not present

## 2018-09-08 DIAGNOSIS — Z77120 Contact with and (suspected) exposure to mold (toxic): Secondary | ICD-10-CM | POA: Insufficient documentation

## 2018-09-08 DIAGNOSIS — M25561 Pain in right knee: Secondary | ICD-10-CM | POA: Insufficient documentation

## 2018-09-08 NOTE — Assessment & Plan Note (Signed)
Patient reports that she is working with her apartment complex to have mold inspected and removed. She endorses improvement in breathing symptoms with albuterol. No evidence of volume overload or cardiac etiology of dyspnea. - refer to Dr. Valentina Lucks for PFT's - refilled albuterol

## 2018-09-08 NOTE — Assessment & Plan Note (Signed)
-   no red flags for joint infection - 40 mg solumedrol right knee joint injection today in the office - prescribed meloxicam to use once daily PRN pain, when it runs out patient can switch to OTC ibuprofen - encourage continued movement at the joint - return precautions provided - follow up as needed

## 2018-09-09 DIAGNOSIS — M25569 Pain in unspecified knee: Secondary | ICD-10-CM | POA: Diagnosis not present

## 2018-09-10 ENCOUNTER — Encounter: Payer: Self-pay | Admitting: Pharmacist

## 2018-09-10 ENCOUNTER — Other Ambulatory Visit: Payer: Self-pay

## 2018-09-10 ENCOUNTER — Ambulatory Visit (INDEPENDENT_AMBULATORY_CARE_PROVIDER_SITE_OTHER): Payer: Medicaid Other | Admitting: Pharmacist

## 2018-09-10 VITALS — BP 145/98 | HR 78 | Ht 75.0 in | Wt 260.0 lb

## 2018-09-10 DIAGNOSIS — F17200 Nicotine dependence, unspecified, uncomplicated: Secondary | ICD-10-CM | POA: Diagnosis not present

## 2018-09-10 DIAGNOSIS — J449 Chronic obstructive pulmonary disease, unspecified: Secondary | ICD-10-CM | POA: Diagnosis not present

## 2018-09-10 DIAGNOSIS — Z720 Tobacco use: Secondary | ICD-10-CM | POA: Diagnosis not present

## 2018-09-10 DIAGNOSIS — M25569 Pain in unspecified knee: Secondary | ICD-10-CM | POA: Diagnosis not present

## 2018-09-10 MED ORDER — DULERA 200-5 MCG/ACT IN AERO: 2.0000 | INHALATION_SPRAY | Freq: Two times a day (BID) | RESPIRATORY_TRACT | 3 refills | Status: DC

## 2018-09-10 MED ORDER — CHANTIX STARTING MONTH PAK 0.5 MG X 11 & 1 MG X 42 PO TABS: ORAL_TABLET | ORAL | 0 refills | Status: DC

## 2018-09-10 NOTE — Patient Instructions (Addendum)
It was great to see you today!  Your lung function tests revealed some obstruction, but also improved with albuterol use. This shows that you have some chronic lung disease from your smoking, but also some "allergic" type lung disease that the mold made worse.   We are starting a new inhaler called Dulera - take 2 puffs twice daily. Rinse your mouth out after using this medication.   You can keep using your albuterol inhaler as needed for any wheezing/shortness of breath.   We are are also starting Chantix (varenicline) to help you with smoking cessation. You will take 0.5 mg once daily for 3 days, then twice daily for 4 days, then 1 mg twice daily. Please call us if you have significant nausea with this medication. Take it with a meal.   You can also call the [place] Quit Line: 1-800-QUIT-NOW. They are trained counselors that can help provide you support with your goal to quit smoking  Since Zantac helped with your acid reflux, try Pepcid (famotidine) instead. Try to use the Protonix (pantoprazole) no more than twice daily. You can also use Tums as needed throughout the day to help with acid reflux.    We will call you next week to check in.

## 2018-09-10 NOTE — Progress Notes (Signed)
Patient ID: [name], female   DOB: 06/02/1965, 53 y.o.   MRN: [id number] Reviewed: Agree with Dr. Graylin Shiver documentation and management.

## 2018-09-10 NOTE — Progress Notes (Signed)
S:    Patient arrives in poor spirits, noting significant stress related to black mold in her house. Presents for lung function evaluation.  Patient was referred by Dr. Burr Medico (referred on 09/07/2018) after a visit for respiratory issues related to the mold. She brings in pictures of the mold in her apartment.   Today, she notes that her breathing has been terrible since black mold appeared in her home (was seen in 10/2017 for questionable mold exposure). She is currently living with her daughter, and is working with an attorney to move to somewhere else under [address] housing. She requests documentation today for her attorney related to her lung disease.   Patient reports breathing has been worse for the last several months. Patient denies atopic sx consistent with  (atopic dermatitis, allergic rhinitis). Endorses wheezing, congestion, and coughing.   Patient reports adherence to medications Patient reports last dose of asthma medications was this morning around 7:30 am  Current asthma medications: Albuterol HFA Rescue inhaler use frequency: multiple times daily   Level of asthma sx control- in the last 4 weeks: Question Scoring Patient Score  Daytime sx more than twice a week Yes (1) No (0) 1  Any nighttime waking due to asthma Yes (1) No (0) 1  Reliever needed >2x/week Yes (1) No (0) 1  Any activity limitation due to asthma Yes (1) No (0) 1   Total Score 4  Well controlled - 0, partly controlled - 1-2, uncontrolled 3-4  Age when started using tobacco on a daily basis: 64  Most recent quit attempt when her children were born;  Longest time ever been tobacco free 2-3 years after children were born. Denies trying NRTs in the past for smoking cessation. Requests Chantix (verinicline).  Most common triggers to use tobacco include; enjoying the smell  Motivation to quit: her grandchild is being born later this year, and she wants to be quit by then. States her daughter is a strong  supporter at home.  She also notes that since stopping rantidine, she has been taking pantoprazole four times daily for GERD.    O: Physical Exam Pulmonary:     Comments: Some dyspnea and distress upon arrival.  Improved with rest and a few minutes of relaxation. Neurological:     Mental Status: She is alert.    Review of Systems  Respiratory: Positive for shortness of breath and wheezing.   Psychiatric/Behavioral: The patient is nervous/anxious (and tearful).    Vitals:   09/10/18 1044  BP: (!) 145/98  Pulse: 78  SpO2: 98%    See "scanned report" or Documentation Flowsheet (discrete results - PFTs) for  Spirometry results. Patient provided good effort while attempting spirometry.   Lung Age = 89 Albuterol Neb  Lot# [id number]     Exp. 02/2020  A/P:  COPD-asthma overlap Patient has been experiencing shortness of breath and wheezing, and is taking albuterol multiple times a day. Spirometry evaluation reveals severe obstructive lung disease. Post nebulized albuterol tx revealed reversibility, suggestive of COPD-asthma overlap. -Added Dulera (mometasone/formoterol) MDI twice daily -Educated patient on purpose, proper use, potential adverse effects including risk of esophageal candidiasis and need to rinse mouth after each use.  -Reviewed results of pulmonary function tests.  -Patient provided with and educated on use of peak flow meter.  -Dr. Andria Frames has provided a letter for her attorney today.  Tobacco abuse  -Mild nicotine dependence of 10 years duration in a patient who is good candidate for success. -Initiated Chantix (  varenicline) titration of 0.5 mg by mouth once daily with food x3 days, then 0.5 mg by mouth twice daily with food x4 days, then 1 mg by mouth twice daily with food. Patient counseled on purpose, proper use, and potential adverse effects, including GI upset, and potential change in mood.  -Provided information on 1 800-QUIT NOW support program.   Medication  Management/Acid Reflux  -Reduce pantoprazole to BID instead if QID -Recommended use of famotidine daily   Patient verbalized understanding of results and education.  Written pt instructions provided. F/U phone call on 09/17/2018.  Total time in face to face counseling 90 minutes.  Patient seen with Crissie FiguresArden Bui PharmD Candidate and Catie Feliz Beamravis, PharmD, PGY2 Pharmacy Resident

## 2018-09-10 NOTE — Assessment & Plan Note (Signed)
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 -Initiated Chantix (varenicline) titration of 0.5 mg by mouth once daily with food x3 days, then 0.5 mg by mouth twice daily with food x4 days, then 1 mg by mouth twice daily with food. Patient counseled on purpose, proper use, and potential adverse effects, including GI upset, and potential change in mood.  -Provided information on 1 800-QUIT NOW support program.

## 2018-09-10 NOTE — Assessment & Plan Note (Signed)
COPD-asthma overlap Patient has been experiencing shortness of breath and wheezing, and is taking albuterol multiple times a day. Spirometry evaluation reveals severe obstructive lung disease. Post nebulized albuterol tx revealed reversibility, suggestive of COPD-asthma overlap. -Added Dulera (mometasone/formoterol) MDI twice daily -Educated patient on purpose, proper use, potential adverse effects including risk of esophageal candidiasis and need to rinse mouth after each use.  -Reviewed results of pulmonary function tests.  -Patient provided with and educated on use of peak flow meter.  -Dr. Andria Frames has provided a letter for her attorney today.  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 -Initiated Chantix (varenicline) titration of 0.5 mg by mouth once daily with food x3 days, then 0.5 mg by mouth twice daily with food x4 days, then 1 mg by mouth twice daily with food. Patient counseled on purpose, proper use, and potential adverse effects, including GI upset, and potential change in mood.  -Provided information on 1 800-QUIT NOW support program.

## 2018-09-11 DIAGNOSIS — M25569 Pain in unspecified knee: Secondary | ICD-10-CM | POA: Diagnosis not present

## 2018-09-12 DIAGNOSIS — M25569 Pain in unspecified knee: Secondary | ICD-10-CM | POA: Diagnosis not present

## 2018-09-13 DIAGNOSIS — M25569 Pain in unspecified knee: Secondary | ICD-10-CM | POA: Diagnosis not present

## 2018-09-14 DIAGNOSIS — M25569 Pain in unspecified knee: Secondary | ICD-10-CM | POA: Diagnosis not present

## 2018-09-15 DIAGNOSIS — M25569 Pain in unspecified knee: Secondary | ICD-10-CM | POA: Diagnosis not present

## 2018-09-16 DIAGNOSIS — M25569 Pain in unspecified knee: Secondary | ICD-10-CM | POA: Diagnosis not present

## 2018-09-17 ENCOUNTER — Telehealth: Payer: Self-pay | Admitting: Pharmacist

## 2018-09-17 DIAGNOSIS — M25569 Pain in unspecified knee: Secondary | ICD-10-CM | POA: Diagnosis not present

## 2018-09-17 NOTE — Telephone Encounter (Signed)
Called patient today for follow-up on smoking cessation. Patient states that she has gone "cold [place]" the past 4 days since starting Chantix. Reports adherence and is tolerating the medication well. Denies challenges with urge to smoke throughout the day but experiences reduced appetite. She will continue to use this medication as instructed on starter pack.   Upon medication review, patient stated taking her Dulera in the morning every other day rather than twice daily. However, she endorsed relief on this regimen and has not had to use her albuterol. She is currently living with her daughter and is in the process of working with her lawyer to find a new place. During last visit on 09/10/18 with [place], she stated that she used pantoprazole QID but is now using it once daily.   Will F/U via phone call in 2 week.

## 2018-09-18 DIAGNOSIS — M25569 Pain in unspecified knee: Secondary | ICD-10-CM | POA: Diagnosis not present

## 2018-09-19 DIAGNOSIS — M25569 Pain in unspecified knee: Secondary | ICD-10-CM | POA: Diagnosis not present

## 2018-09-20 DIAGNOSIS — M25569 Pain in unspecified knee: Secondary | ICD-10-CM | POA: Diagnosis not present

## 2018-09-21 DIAGNOSIS — M25569 Pain in unspecified knee: Secondary | ICD-10-CM | POA: Diagnosis not present

## 2018-09-22 DIAGNOSIS — M25569 Pain in unspecified knee: Secondary | ICD-10-CM | POA: Diagnosis not present

## 2018-09-23 DIAGNOSIS — M25569 Pain in unspecified knee: Secondary | ICD-10-CM | POA: Diagnosis not present

## 2018-09-24 ENCOUNTER — Telehealth: Payer: Self-pay | Admitting: Pharmacist

## 2018-09-24 DIAGNOSIS — M25569 Pain in unspecified knee: Secondary | ICD-10-CM | POA: Diagnosis not present

## 2018-09-24 NOTE — Telephone Encounter (Signed)
F/U for tobacco cessation - Reports complete cessation and now taking 1mg  blue pills BID without side effect.   Dyspnea improved - now able to climb two flights of stairs without stopping.  Still out of breath at the top.  Reports taking 1 puff of Dulera daily.  Encouraged to increase use to 2 puffs BID to minimize albuterol dosing further.    Follow up via phone in 1 week.

## 2018-09-24 NOTE — Telephone Encounter (Signed)
-----   Message from Leavy Cella, [facility] sent at 09/10/2018  3:12 PM EDT ----- Regarding: tobacco cessation Revaluate breathing with LABA steroid combination (Dulera) and potential tobacco cessation.  Motivated by birth of grand-child

## 2018-09-25 DIAGNOSIS — M25569 Pain in unspecified knee: Secondary | ICD-10-CM | POA: Diagnosis not present

## 2018-09-26 DIAGNOSIS — M25569 Pain in unspecified knee: Secondary | ICD-10-CM | POA: Diagnosis not present

## 2018-09-27 DIAGNOSIS — M25569 Pain in unspecified knee: Secondary | ICD-10-CM | POA: Diagnosis not present

## 2018-09-28 ENCOUNTER — Other Ambulatory Visit: Payer: Self-pay | Admitting: Student in an Organized Health Care Education/Training Program

## 2018-09-28 DIAGNOSIS — M25569 Pain in unspecified knee: Secondary | ICD-10-CM | POA: Diagnosis not present

## 2018-09-28 DIAGNOSIS — M170 Bilateral primary osteoarthritis of knee: Secondary | ICD-10-CM

## 2018-09-29 DIAGNOSIS — M25569 Pain in unspecified knee: Secondary | ICD-10-CM | POA: Diagnosis not present

## 2018-09-30 DIAGNOSIS — M25569 Pain in unspecified knee: Secondary | ICD-10-CM | POA: Diagnosis not present

## 2018-10-01 ENCOUNTER — Telehealth: Payer: Self-pay

## 2018-10-01 ENCOUNTER — Other Ambulatory Visit: Payer: Self-pay | Admitting: Family Medicine

## 2018-10-01 DIAGNOSIS — M25569 Pain in unspecified knee: Secondary | ICD-10-CM | POA: Diagnosis not present

## 2018-10-01 MED ORDER — TRAMADOL HCL 50 MG PO TABS: 50.0000 mg | ORAL_TABLET | Freq: Four times a day (QID) | ORAL | 0 refills | Status: DC | PRN

## 2018-10-01 NOTE — Telephone Encounter (Signed)
Pt calling and would like to get the medication with Vicodin again. Pt states she is in a lot of paint and ibuprofen is not working. [facility] [place]. Ottis Stain, CMA

## 2018-10-01 NOTE — Telephone Encounter (Signed)
I do not think that Vicodin is an appropriate choice for [name].  She has not had this medication in a long time, and it is not appropriate for arthritis pain unfortunately.  I am amenable to sending a month supply of tramadol since this has been agreed upon in the past at our clinic.  [facility] mg [facility] [address].

## 2018-10-02 ENCOUNTER — Telehealth: Payer: Self-pay | Admitting: Pharmacist

## 2018-10-02 DIAGNOSIS — M25569 Pain in unspecified knee: Secondary | ICD-10-CM | POA: Diagnosis not present

## 2018-10-02 NOTE — Telephone Encounter (Signed)
Attempted phone call follow up RE tobacco cessation - no answer - left encouraging message  Plan additional phone follow up in 1 week.

## 2018-10-02 NOTE — Telephone Encounter (Signed)
-----   Message from Leavy Cella, [facility] sent at 09/24/2018  2:11 PM EDT ----- Regarding: Tobacco Cessaton

## 2018-10-03 DIAGNOSIS — M25569 Pain in unspecified knee: Secondary | ICD-10-CM | POA: Diagnosis not present

## 2018-10-04 DIAGNOSIS — M25569 Pain in unspecified knee: Secondary | ICD-10-CM | POA: Diagnosis not present

## 2018-10-05 ENCOUNTER — Telehealth: Payer: Medicaid Other | Admitting: Family Medicine

## 2018-10-05 ENCOUNTER — Other Ambulatory Visit: Payer: Self-pay

## 2018-10-05 ENCOUNTER — Ambulatory Visit: Payer: Medicaid Other | Admitting: Family Medicine

## 2018-10-05 ENCOUNTER — Telehealth (INDEPENDENT_AMBULATORY_CARE_PROVIDER_SITE_OTHER): Payer: Medicaid Other | Admitting: Family Medicine

## 2018-10-05 DIAGNOSIS — N898 Other specified noninflammatory disorders of vagina: Secondary | ICD-10-CM | POA: Diagnosis not present

## 2018-10-05 DIAGNOSIS — M25569 Pain in unspecified knee: Secondary | ICD-10-CM | POA: Diagnosis not present

## 2018-10-05 NOTE — Progress Notes (Signed)
[facility] Telemedicine Visit  Patient consented to have virtual visit. Method of visit: Telephone  Encounter participants: Patient: [name] - located at home Provider: Bonnita Hollow - located at office Others (if applicable): None  Chief Complaint: Vaginal discharge  HPI: Patient complains of vaginal discharge since he "started new inhalers ".  Says that this is been about 2 weeks.  This corresponds with starting of inhaled corticosteroid inhaler when she saw Dr. Valentina Lucks on 6/18.  Patient denies any recent antibiotic use.  That she has had multiple yeast infections in the past.  Has that she is having cloudy gray discharge.  Does not endorse any fevers, chills, pelvic pain.  ROS: per HPI  Pertinent PMHx: Environmental exposure to mold  Exam:  Respiratory: Able to speak in full sentences without pause of breath  Assessment/Plan: Vaginal discharge Patient recently started on inhaled ICS and also takes intranasal fluticasone.  This could not slightly increase her risk for yeast infection.  Given history of cloudy gray discharge, could also be BV.  Cannot rule other STIs.  Recommend patient come in to be evaluated with wet prep, STI testing and treated treated appropriately.  Patient scheduled for  ATC tomorrow at 8:55 a.m.  Time spent during visit with patient: 8 minutes

## 2018-10-05 NOTE — Progress Notes (Deleted)
[facility] Telemedicine Visit  Call patient.  Unable to get.  Left voicemail to call clinic back.   Patient consented to have virtual visit. Method of visit: {TELEPHONE VS IDPOE:[id number]}  Encounter participants: Patient: [name] - located at *** Provider: Bonnita Hollow - located at *** Others (if applicable): ***  Chief Complaint: ***  HPI:  ***  ROS: per HPI  Pertinent PMHx: ***  Exam:  Respiratory: ***  Assessment/Plan:  No problem-specific Assessment & Plan notes found for this encounter.    Time spent during visit with patient: *** minutes

## 2018-10-06 ENCOUNTER — Other Ambulatory Visit: Payer: Self-pay

## 2018-10-06 ENCOUNTER — Encounter: Payer: Self-pay | Admitting: Family Medicine

## 2018-10-06 ENCOUNTER — Ambulatory Visit (INDEPENDENT_AMBULATORY_CARE_PROVIDER_SITE_OTHER): Payer: Medicaid Other | Admitting: Family Medicine

## 2018-10-06 VITALS — BP 140/82 | HR 73 | Wt 259.4 lb

## 2018-10-06 DIAGNOSIS — N898 Other specified noninflammatory disorders of vagina: Secondary | ICD-10-CM | POA: Diagnosis not present

## 2018-10-06 DIAGNOSIS — M25569 Pain in unspecified knee: Secondary | ICD-10-CM | POA: Diagnosis not present

## 2018-10-06 LAB — POCT WET PREP (WET MOUNT)
Clue Cells Wet Prep Whiff POC: POSITIVE
WBC, Wet Prep HPF POC: >20

## 2018-10-06 MED ORDER — METRONIDAZOLE 500 MG PO TABS: 500.0000 mg | ORAL_TABLET | Freq: Two times a day (BID) | ORAL | 0 refills | Status: AC

## 2018-10-06 NOTE — Patient Instructions (Addendum)
[name],  It was nice seeing you today. Your lab results showed that you were positive for trichomonas and bacterial Vaginosis.   We are sending antibiotics to your pharmacy. Please take the pills twice daily for the next seven days. Do not drink alcohol while you are taking the antibiotics.   Your partner would need to be treated for the trichomonas.  Lever 2000 antibacterial soap can help with your irritation.   Reach out to [place] if you are still having symptoms.

## 2018-10-06 NOTE — Assessment & Plan Note (Signed)
[name] presents with symptoms of odorous vaginal discharge without pelvic pain or recent antibiotic use. She is postmenopausal and has no history of DM. No new sexual partners to concern [place] of STIs. She has no pruritis, and no abdominal pain. On pelvic exam she had white discharge which had moderate clue cells and bacteria on wet prep. Her labs confirm my suspicion of bacterial vaginosis and trichomonas infection. I want to treat her with a 7 day course of metronidazole, 500 mg orally twice daily. Her symptoms should improve 3-4 days into the course of antibiotics. Her partner need to consider getting treated for trichomonas.

## 2018-10-06 NOTE — Telephone Encounter (Signed)
Called pt yesterday and gave her the below information and also pt stated that she was going to have an appointment today @8 :55am, in reviewing her chart I did not see an appointment scheduled so we scheduled it for today with Dr. Erin Hearing. Deni Berti Zimmerman Rumple, CMA

## 2018-10-06 NOTE — Progress Notes (Signed)
Date of Visit: 10/06/2018   CC: Vaginal Discharge  HPI:  [name] is a 53 year old female with past history of yeast infections presenting to the clinic with a two week history of vaginal discharge. The discharge is odorous and white in color. No recent history of antibiotics, new products or new sexual partners. She denies vulvar pruritis, vaginal soreness, vaginal spotting, fever and abdominal pain. No pain with urination, some discomfort with sexual intercourse. No new sexual partners. She has a history of hidradenitis suppurativa that makes the external genitalia more sensitive. She can't remember her last menstrual cycle, thinks it has been years.   Pain and swelling in both knees that she thinks is related to her osteoarthritis. Her prescription was sent to the wrong pharmacy.   Family history of DM but no personal history.  PMHX: Medical Hx:   Asthma-COPD- controlled with albuterol, intranasal fluticasone  Osteoarthritis of both knees- controlled with meloxicam   Hidradenitis suppurativa  Tobacco use- varenicline since 6/18 Allergies: none she could recall  FHX: DM  SHX: 18 pack year smoking history. Currently on Chantix. Is sexually active.  Has stopped smoking for about a month   ROS: See HPI.  PHYSICAL EXAM: BP 140/82   Pulse 73   Wt 259 lb 6.4 oz (117.7 kg)   SpO2 99%   BMI 32.42 kg/m  Gen: Well appearing, conversant women. Concerned about vaginal discharge.  HEENT: Normocephalic head.  Heart: S1S2 RRR. No m/r/g.  Lungs: CTA bilaterally. No wheezing or crackles.  Abd: Soft and non-tender with our rebound or guarding.  Pelvic: external female genitalia. Inflammatory nodules on both labia majora with no sinus tracts. Normal vaginal epithelium, some white discharge present. Cervix partly seen with no lesion Ext: No lower extremity edema.   Labs:  Office Visit on 10/06/2018  Component Date Value Ref Range Status  . Source Wet Prep POC 10/06/2018 vaginal    Final  . WBC, Wet Prep HPF POC 10/06/2018 >20   Final  . Bacteria Wet Prep HPF POC 10/06/2018 Many* Few Final  . Clue Cells Wet Prep HPF POC 10/06/2018 Moderate* None Final  . Clue Cells Wet Prep Whiff POC 10/06/2018 Positive Whiff   Final  . Yeast Wet Prep HPF POC 10/06/2018 None  None Final  . KOH Wet Prep POC 10/06/2018 None  None Final  . Trichomonas Wet Prep HPF POC 10/06/2018 Present* Absent Final    ASSESSMENT/PLAN:  Vaginal irritation [name] presents with symptoms of odorous vaginal discharge without pelvic pain or recent antibiotic use. She is postmenopausal and has no history of DM. No new sexual partners to concern [place] of STIs. She has no pruritis, and no abdominal pain. On pelvic exam she had white discharge which had moderate clue cells and bacteria on wet prep. Her labs confirm my suspicion of bacterial vaginosis and trichomonas infection. I want to treat her with a 7 day course of metronidazole, 500 mg orally twice daily. Her symptoms should improve 3-4 days into the course of antibiotics. Her partner need to consider getting treated for trichomonas.  FOLLOW UP: Follow up in 1 week if symptoms persist.   [name] MS3, Medical Student   I was present during history and exam  Lind Covert

## 2018-10-07 ENCOUNTER — Ambulatory Visit: Payer: Medicaid Other

## 2018-10-07 DIAGNOSIS — M25569 Pain in unspecified knee: Secondary | ICD-10-CM | POA: Diagnosis not present

## 2018-10-08 ENCOUNTER — Telehealth: Payer: Self-pay | Admitting: Pharmacist

## 2018-10-08 DIAGNOSIS — M25569 Pain in unspecified knee: Secondary | ICD-10-CM | POA: Diagnosis not present

## 2018-10-08 NOTE — Telephone Encounter (Signed)
Reports continued abstinence from tobacco.   Some nausea reported - resolves when taking with food (consider QD dosing at next contact)  Weight gain discussed.  Portion control emphasized.

## 2018-10-08 NOTE — Telephone Encounter (Signed)
-----   Message from Leavy Cella, [facility] sent at 10/02/2018  3:07 PM EDT ----- Regarding: Tobacco Cessation follow up

## 2018-10-09 DIAGNOSIS — M25569 Pain in unspecified knee: Secondary | ICD-10-CM | POA: Diagnosis not present

## 2018-10-10 DIAGNOSIS — M25569 Pain in unspecified knee: Secondary | ICD-10-CM | POA: Diagnosis not present

## 2018-10-11 DIAGNOSIS — M25569 Pain in unspecified knee: Secondary | ICD-10-CM | POA: Diagnosis not present

## 2018-10-12 DIAGNOSIS — M25569 Pain in unspecified knee: Secondary | ICD-10-CM | POA: Diagnosis not present

## 2018-10-13 DIAGNOSIS — M25569 Pain in unspecified knee: Secondary | ICD-10-CM | POA: Diagnosis not present

## 2018-10-14 ENCOUNTER — Other Ambulatory Visit: Payer: Self-pay | Admitting: Family Medicine

## 2018-10-14 DIAGNOSIS — M25569 Pain in unspecified knee: Secondary | ICD-10-CM | POA: Diagnosis not present

## 2018-10-14 NOTE — Progress Notes (Signed)
error 

## 2018-10-15 ENCOUNTER — Telehealth: Payer: Self-pay | Admitting: Pharmacist

## 2018-10-15 ENCOUNTER — Other Ambulatory Visit: Payer: Self-pay | Admitting: Family Medicine

## 2018-10-15 DIAGNOSIS — M25569 Pain in unspecified knee: Secondary | ICD-10-CM | POA: Diagnosis not present

## 2018-10-15 MED ORDER — PANTOPRAZOLE SODIUM 40 MG PO TBEC: 40.0000 mg | DELAYED_RELEASE_TABLET | Freq: Every day | ORAL | 2 refills | Status: DC

## 2018-10-15 NOTE — Telephone Encounter (Signed)
I manually ordered this medication because the current request is for [facility], which is not accepted by [facility] unless I ordered it manually.  This is why it did not go through yesterday.  It looks like she receives her other medications at [place], however, so I can send it there if she needs.  But it should be ready for her at [facility] on [address].

## 2018-10-15 NOTE — Telephone Encounter (Signed)
-----   Message from Leavy Cella, [facility] sent at 10/08/2018  4:00 PM EDT ----- Regarding: tobacco f/u

## 2018-10-15 NOTE — Telephone Encounter (Signed)
Contacted [facility] at [place] to cofirm if they had the Rx and pharmacy tech Jazmin stated it was in the works and would be ready at 6pm.  I called pt and informed her of below. Madaleine Simmon Zimmerman Rumple, CMA

## 2018-10-15 NOTE — Telephone Encounter (Signed)
Patient continues to abstain from smoking   Complains of Weight Gain.   Interested in repeating PFTs in the future.  -Decreased Chantix to 1 pill per day until gone Encouraged to continue quit smoking.  Appears committed to long-term   Follow up by phone in 3-4 weeks

## 2018-10-15 NOTE — Telephone Encounter (Signed)
Pt is calling to check on the status of having her Protonix refilled. She has taken her last pill and said it is important she doesn't go long without medication.

## 2018-10-16 DIAGNOSIS — M25569 Pain in unspecified knee: Secondary | ICD-10-CM | POA: Diagnosis not present

## 2018-10-17 DIAGNOSIS — M25569 Pain in unspecified knee: Secondary | ICD-10-CM | POA: Diagnosis not present

## 2018-10-18 DIAGNOSIS — M25569 Pain in unspecified knee: Secondary | ICD-10-CM | POA: Diagnosis not present

## 2018-10-19 DIAGNOSIS — M25569 Pain in unspecified knee: Secondary | ICD-10-CM | POA: Diagnosis not present

## 2018-10-20 DIAGNOSIS — M25569 Pain in unspecified knee: Secondary | ICD-10-CM | POA: Diagnosis not present

## 2018-10-21 DIAGNOSIS — M25569 Pain in unspecified knee: Secondary | ICD-10-CM | POA: Diagnosis not present

## 2018-10-22 ENCOUNTER — Telehealth: Payer: Self-pay | Admitting: Family Medicine

## 2018-10-22 ENCOUNTER — Other Ambulatory Visit: Payer: Self-pay

## 2018-10-22 ENCOUNTER — Other Ambulatory Visit: Payer: Self-pay | Admitting: Family Medicine

## 2018-10-22 ENCOUNTER — Ambulatory Visit (INDEPENDENT_AMBULATORY_CARE_PROVIDER_SITE_OTHER): Payer: Medicaid Other | Admitting: Family Medicine

## 2018-10-22 DIAGNOSIS — M25569 Pain in unspecified knee: Secondary | ICD-10-CM | POA: Diagnosis not present

## 2018-10-22 DIAGNOSIS — M170 Bilateral primary osteoarthritis of knee: Secondary | ICD-10-CM

## 2018-10-22 MED ORDER — MELOXICAM 15 MG PO TABS: 15.0000 mg | ORAL_TABLET | Freq: Every day | ORAL | 0 refills | Status: DC

## 2018-10-22 NOTE — Progress Notes (Addendum)
Patient came into the office for an injection in her left knee.  However patient had injection in left knee on 08/27/2018 as well as a right knee on 09/07/2018.  When patient was told that she was no longer allowed to have an injection she reported that she did not want to speak with anyone.  However upon me entering the room patient stated that she is also upset that she is no longer receiving her half-tramadol, half-Vicodin medications for her pain control because she states that this Vicodin is the only thing that has helped her.   Apologized to patient that she was scheduled for a visit for an injection in her knee when it is not an appropriate time period.  Explained to her the risks of giving another injection as well as the likelihood that it would not work given that the last one did not last very long.  Patient reports that she does not want to be seen by orthopedics because they recommend surgery and she currently does not have any wear that she can heal appropriately.  She also reports that she is trying to avoid going into doctors offices given her current respiratory status with mold in her apartment.    On chart review appears that Dr. Shan Levans does not believe that Vicodin is appropriate, which I completely agree with.  Educated patient that that combination pill does not exist and when I told her that I would not prescribe her Vicodin as this is not a long-term solution to her problem.  Educated patient that orthopedics would be her next step in managing her knee pain given the extent of her arthritis. Patient then got up and left saying that she did not want to discuss any other options.   [place] Edvin Albus, DO PGY-3, [facility]   *Please do not charge patient co-pay fee.*

## 2018-10-22 NOTE — Telephone Encounter (Signed)
**  After Hours Emergency Call**  Patient frustrated calling after-hours line for pain medication due to her knee arthritis and cannot sleep due to the pain.  Presented to clinic today for additional knee steroid injection, however unfortunately was not appropriate duration of time since previous one.  She is additionally frustrated because she states no one called her back and was told that someone will call her by 5 PM for more medication. However do note in telephone encounters that attempts were made however no answer or voicemail set up.  Addressed that we do not usually prescribe medications via this line.  Patient aware after conversation with Dr. Enid Derry today that tramadol would not be continued, however just wanted "something" for pain.  Asked if she had any more of her meloxicam which [facility]  States she was never able to pick that up and had been getting all of her prescriptions sent over to [facility] on [address] recently which is more convenient for her due to transportation issues.  States this has been very effective for her in the past.  [facility], confirmed with pharmacist that patient never filled meloxicam prescription on 7/6 and then canceled this prescription.  Due to this and to avoid an unnecessary ED visit, felt it was reasonable to send in small supply of [facility]am to [facility] instead, which she can access tonight and is open until 1:30 AM.  Last Cr wnl (0.9).   Instructed avoidance of other NSAIDs including ibuprofen/Aleve while using meloxicam, can additionally use Tylenol 650 every 4-6 hours as needed for pain.  Will route to PCP and Dr. Enid Derry who met with the patient today.  Patriciaann Clan, DO

## 2018-10-22 NOTE — Telephone Encounter (Signed)
Pt lm on nurse line.  " I dont care what I get, just give me something".  Attempted to call back.  No answer and no machine. Christen Bame, CMA

## 2018-10-22 NOTE — Telephone Encounter (Signed)
Pt called to see if she could get a refill for her knee pain. Please give pt a call back.

## 2018-10-22 NOTE — Telephone Encounter (Signed)
Pt called about a refill on her traMADol but pt states she needs the traMADol tablets that are half traMADol and half hydrocodone. Pt also states that her pain is so bad that she can't sleep at night, Pt asked if it could be called in at [facility] on [address]. Please give pt a call back.

## 2018-10-22 NOTE — Telephone Encounter (Signed)
Pt called asking why nothing had been called in for her yet. I asked patient if she was talking about her tramadol and she states "I don't know, I don't care what it is as long as they call me something in." Pt then gets really nasty and ugly with my on the phone stating she wants to change doctors because [facility] never does anything for her or responds to her messages. Told patient we have to allow 24-48 hours for doctors to respond to refill requests and she just requested it at noon today. Also told patient she could request a form to change PCP. Patient continued to be disrespectful to me so I transferred to nurse line. Patient then calls back and asks " why would you transfer me there when you know no one would answer?" Ask patient if she left a VM because the nurse may have been with a patient at the time and she would call her back. Patient states she didn't leave a voicemail and just continues to be rude to me. Transferred to nurse line again for her to LVM.

## 2018-10-22 NOTE — Telephone Encounter (Signed)
Routed to doctor that she had appointment scheduled with today but ended up leaving as an [place].

## 2018-10-23 DIAGNOSIS — M25569 Pain in unspecified knee: Secondary | ICD-10-CM | POA: Diagnosis not present

## 2018-10-24 DIAGNOSIS — M25569 Pain in unspecified knee: Secondary | ICD-10-CM | POA: Diagnosis not present

## 2018-10-25 DIAGNOSIS — M25569 Pain in unspecified knee: Secondary | ICD-10-CM | POA: Diagnosis not present

## 2018-10-26 ENCOUNTER — Telehealth: Payer: Self-pay | Admitting: *Deleted

## 2018-10-26 DIAGNOSIS — M25569 Pain in unspecified knee: Secondary | ICD-10-CM | POA: Diagnosis not present

## 2018-10-26 NOTE — Telephone Encounter (Signed)
-----   Message from Kathrene Alu, MD sent at 10/23/2018  5:16 PM EDT ----- Regarding: Virtual patient appointment Would you make a virtual appointment for [name] with me for sometime next week?  I would like to discuss with her our plan for her knee pain and set some boundaries regarding behavior with our staff.  Thank you.

## 2018-10-26 NOTE — Telephone Encounter (Signed)
Contacted pt and virtual visit scheduled for 10/28/2018 @ 9:10am. Pt wanted to let the doctor know that the medication (meloxicam) prescribed from after hours doctor is working for her but they only gave her 16 pills.  She stated that she is having to take one in am and 1 in pm if pain is bad.  She wanted to see about getting this refilled since it is helping. April Zimmerman Rumple, CMA

## 2018-10-27 DIAGNOSIS — M25569 Pain in unspecified knee: Secondary | ICD-10-CM | POA: Diagnosis not present

## 2018-10-28 ENCOUNTER — Ambulatory Visit: Payer: Medicaid Other | Admitting: Family Medicine

## 2018-10-28 DIAGNOSIS — M25569 Pain in unspecified knee: Secondary | ICD-10-CM | POA: Diagnosis not present

## 2018-10-29 ENCOUNTER — Telehealth: Payer: Self-pay | Admitting: Pharmacist

## 2018-10-29 ENCOUNTER — Other Ambulatory Visit: Payer: Self-pay

## 2018-10-29 ENCOUNTER — Telehealth (INDEPENDENT_AMBULATORY_CARE_PROVIDER_SITE_OTHER): Payer: Medicaid Other | Admitting: Family Medicine

## 2018-10-29 ENCOUNTER — Encounter: Payer: Self-pay | Admitting: Family Medicine

## 2018-10-29 DIAGNOSIS — J449 Chronic obstructive pulmonary disease, unspecified: Secondary | ICD-10-CM

## 2018-10-29 DIAGNOSIS — M170 Bilateral primary osteoarthritis of knee: Secondary | ICD-10-CM | POA: Diagnosis not present

## 2018-10-29 DIAGNOSIS — M25569 Pain in unspecified knee: Secondary | ICD-10-CM | POA: Diagnosis not present

## 2018-10-29 MED ORDER — MELOXICAM 15 MG PO TABS: 15.0000 mg | ORAL_TABLET | Freq: Two times a day (BID) | ORAL | 2 refills | Status: DC | PRN

## 2018-10-29 NOTE — Telephone Encounter (Signed)
Follow Up RE tobacco cessation.   Reports quitting tobacco for 2 months.    Plans to run out of chantix in 4 days and will not continue.  Plans to now work on weight loss and exercise.   Encouraged success. Plan follow up in 2-3 weeks.

## 2018-10-29 NOTE — Telephone Encounter (Signed)
-----   Message from Kathrene Alu, MD sent at 10/29/2018  2:17 PM EDT ----- Regarding: Patient requests a call Hi Dr. Drema Dallas spoke with Ms. Giacomo today, and she mentioned that she would like for you to call her regarding her smoking cessation.  She says that she has continued to abstain from cigarettes, but would like to discuss this further with you.  Thanks, Estill Bamberg

## 2018-10-29 NOTE — Progress Notes (Signed)
[place] Telemedicine Visit  Patient consented to have virtual visit. Method of visit: Telephone  Encounter participants: Patient: [name] - located at home Provider: Kathrene Alu - located at [facility] Others (if applicable): none  Chief Complaint: follow up of knee pain  HPI:  Patient reports that her knee pain is currently well controlled with meloxicam 15 mg, although she tends to take it twice daily most days.  She is in agreement with me that we do not need to pursue any narcotic pain medication for her knee pain other than the occasional tramadol.  She wants to get a knee replacement and knows that this will likely be the most helpful remedy for her knee pain, but currently she has had to move out of her apartment because it is infested with mold and causing her difficulties with breathing.  She is currently living at her daughter's house.  This is been very stressful for her.  She continues to abstain from cigarette use.  When discussing how to better communicate with her due to some difficulties with her feeling like we are not addressing her concerns, she says that she does not often check her voicemails, so when we leave a message, she may not receive it.  She says that she has been particularly stressed during this time, so she has had a difficult time communicating with other people.  ROS: per HPI  Pertinent PMHx: Asthma-COPD overlap syndrome, osteoarthritis of the knee, hidradenitis, bipolar disorder, anxiety  Exam:  Respiratory: Patient able to speak in complete sentences without shortness of breath, no cough  Assessment/Plan:  Osteoarthritis of knee The side effects of prolonged meloxicam use were detailed with the patient, including reduction in kidney function and risk of stomach ulcers.  She was encouraged to take days off from this medication every now and then and to only take it as needed.  She was also encouraged to use Tylenol and  Voltaren gel to supplement this medication.  I told her that I did agree that a knee replacement would likely be very beneficial for her, but if the meloxicam is helping, I am okay with her continuing this as long as she keeps the above recommendations in mind.  Patient expressed understanding.    Asthma-COPD overlap syndrome ([place]) Likely worsening while patient was surrounded by mold in her apartment.  I discussed this patient with Dr. Valentina Lucks, who has worked with patient on smoking cessation and has assessed her lung function.  He is in agreement with the deleterious effect of the mold in her apartment.  I will supply patient with a letter attesting to this that may help her find new housing more quickly.    Time spent during visit with patient: 12 minutes

## 2018-10-30 ENCOUNTER — Telehealth: Payer: Self-pay | Admitting: Family Medicine

## 2018-10-30 ENCOUNTER — Telehealth: Payer: Self-pay | Admitting: *Deleted

## 2018-10-30 ENCOUNTER — Encounter: Payer: Self-pay | Admitting: Family Medicine

## 2018-10-30 DIAGNOSIS — M25569 Pain in unspecified knee: Secondary | ICD-10-CM | POA: Diagnosis not present

## 2018-10-30 NOTE — Telephone Encounter (Signed)
-----   Message from Kathrene Alu, MD sent at 10/30/2018  5:01 PM EDT ----- Regarding: Letter for patient Would you call Ms. Dinino and let her know that I've written her a letter in support of new housing for her?  I've left it up front to be picked up.  Thanks!

## 2018-10-30 NOTE — Telephone Encounter (Signed)
Emergency Line / After Hours Call  Received outside line call (in error it came to me rather than the on call resident) from patient regarding vaginal bleeding. Has been more than a year since she had a period, and she just went to the bathroom and now is having vaginal bleeding. Bleeding amount is like a normal period. She was concerned at having blood coming out of her when it has been so long.  Advised this is postmenopausal bleeding and is abnormal and needs further workup. If bleeding is copious or she develops lightheadedness, dizziness, or shortness of breath she is to go to the ED for evaluation. If it remains like a normal period and the amount of blood does not concern her, she can call the clinic on Monday for an appointment.  Patient appreciative. Will route to PCP.  Chrisandra Netters, MD [place]

## 2018-10-30 NOTE — Assessment & Plan Note (Signed)
Likely worsening while patient was surrounded by mold in her apartment.  I discussed this patient with Dr. Valentina Lucks, who has worked with patient on smoking cessation and has assessed her lung function.  He is in agreement with the deleterious effect of the mold in her apartment.  I will supply patient with a letter attesting to this that may help her find new housing more quickly.

## 2018-10-30 NOTE — Telephone Encounter (Signed)
**  After Hours/ Emergency Line Call**  Patient called and reported that she had already spoken to someone about this and they told her that this line was for emergencies only and to call back on Monday to be seen. Offered to schedule appointment for Monday for this patient to relieve some anxiety concerning her issue. She accepted.  Pt reports starting vaginal bleeding within the last hour. Her last menstrual cycle was was year. She is concerned that she is bleeding now. She also has cramps.   -- patient given instructions to seek care if she feels dizzy, lightheaded, tachycardic and is soaking through pads within 1-2 hours.  -- scheduled patient for ATC on Monday, 8/10  Wilber Oliphant, M.D.  PGY-2  Family Medicine  [phone] 10/30/2018 5:50 PM

## 2018-10-30 NOTE — Assessment & Plan Note (Signed)
The side effects of prolonged meloxicam use were detailed with the patient, including reduction in kidney function and risk of stomach ulcers.  She was encouraged to take days off from this medication every now and then and to only take it as needed.  She was also encouraged to use Tylenol and Voltaren gel to supplement this medication.  I told her that I did agree that a knee replacement would likely be very beneficial for her, but if the meloxicam is helping, I am okay with her continuing this as long as she keeps the above recommendations in mind.  Patient expressed understanding.

## 2018-10-31 DIAGNOSIS — M25569 Pain in unspecified knee: Secondary | ICD-10-CM | POA: Diagnosis not present

## 2018-11-01 DIAGNOSIS — M25569 Pain in unspecified knee: Secondary | ICD-10-CM | POA: Diagnosis not present

## 2018-11-02 ENCOUNTER — Ambulatory Visit (INDEPENDENT_AMBULATORY_CARE_PROVIDER_SITE_OTHER): Payer: Medicaid Other | Admitting: Family Medicine

## 2018-11-02 ENCOUNTER — Other Ambulatory Visit: Payer: Self-pay

## 2018-11-02 VITALS — BP 140/60 | HR 62 | Wt 269.2 lb

## 2018-11-02 DIAGNOSIS — F17200 Nicotine dependence, unspecified, uncomplicated: Secondary | ICD-10-CM | POA: Diagnosis not present

## 2018-11-02 DIAGNOSIS — N939 Abnormal uterine and vaginal bleeding, unspecified: Secondary | ICD-10-CM

## 2018-11-02 DIAGNOSIS — M25569 Pain in unspecified knee: Secondary | ICD-10-CM | POA: Diagnosis not present

## 2018-11-02 LAB — POCT URINE PREGNANCY: Preg Test, Ur: NEGATIVE

## 2018-11-02 MED ORDER — CYCLOBENZAPRINE HCL 10 MG PO TABS: ORAL_TABLET | ORAL | 3 refills | Status: DC

## 2018-11-02 NOTE — Telephone Encounter (Signed)
LVM for pt to call office to inform her of below.Briana Farner Zimmerman Rumple, CMA  

## 2018-11-02 NOTE — Telephone Encounter (Signed)
[name] called and stated pt was on phone returning my call in reference to Dr. Maudie Flakes message, [name] then informed pt of below.[name], CMA

## 2018-11-02 NOTE — Patient Instructions (Signed)
It was nice to see you today,  Because it has not been a full year since your last menstrual cycle, we do not need to do the endometrial biopsy or ultrasound.  You may continue to have some more erratic periods while you go through menopause.  If you have bleeding in between periods during this time, even if it is spotting, you should make another appointment with [place] and we can follow-up with the biopsy and ultrasound.  I will get a CBC to check for anemia and a urine pregnancy test although I expect both of these to be normal.  Please follow-up with your primary care provider as needed.  Have a great day,  Clemetine Marker, MD

## 2018-11-02 NOTE — Progress Notes (Signed)
   [facility] Phone: [phone]     RICKIYA PICARIELLO - 53 y.o. female MRN [id number]  Date of birth: [birth year]  Subjective:   cc: Vaginal bleeding  HPI:  Patient states that Friday she started having vaginal bleeding.  It was having after where she had to use 2 pads that day.  She states now there is only spotting that she notices when she uses the bathroom.  Her last menstrual.  Was October 4 of last year, approximately 10 months ago.  She is unsure when her last menstrual cycle prior to that was.  Patient is also stating she has pain "like a knot" above her bellybutton.  Patient had sexual intercourse with a "ex-" 2 days prior to vaginal bleeding.  They used condoms.  Last time she had sexual intercourse was 4 months prior to that.  She did not have any pain with intercourse.  She denies discharge, pruritus, odor.  Patient also states she feels "tired and drained".  Denies dizziness.  No shortness of breath..  She states she laid around the house most of the weekend.  Smoking: Patient was on Chantix, her last dose was today.  She has not been smoking for approximately 1 month.  ROS: See HPI for pertinent positives and negatives  Past Medical History  Family history reviewed for today's visit. No changes.  Social history- patient is a former smoker  Health Maintenance:  -  Health Maintenance Due  Topic Date Due  . HIV Screening  12/26/1980  . TETANUS/TDAP  12/26/1984  . PAP SMEAR-Modifier  07/26/2010  . MAMMOGRAM  12/27/2015  . COLONOSCOPY  12/27/2015  . INFLUENZA VACCINE  10/24/2018    Objective:   BP 140/60   Pulse 62   Wt 269 lb 3.2 oz (122.1 kg)   SpO2 99%   BMI 33.65 kg/m  Gen: NAD, alert and oriented, cooperative with exam CV: normal rate, regular rhythm. No murmurs, no rubs.  No edema Resp: LCTAB, no wheezes, crackles. normal work of breathing GI: nontender to palpation, BS present, no guarding or organomegaly GU: Patient declined  pelvic exam Psych: Appropriate behavior  Assessment/Plan:   Vaginal bleeding 1 day of regular bleeding followed by 2 days of spotting.  Continuing to spotting.  Last menstrual cycle was 10 months ago.  Most likely perimenopausal bleeding.  Will not get biopsy or ultrasound given that it has been less than 1 year.  Advised patient she can continue to have irregular cycles, but if she has spotting in between cycles or continued spotting, is to be reason to perform further work-up in the clinic. - CBC - POCT urine pregnancy test  TOBACCO ABUSE Patient reports she is not currently smoking.  Today is her last day of Chantix.  Encouraged patient to continue smoking cessation.   Orders Placed This Encounter  Procedures  . CBC  . POCT urine pregnancy    Meds ordered this encounter  Medications  . cyclobenzaprine (FLEXERIL) 10 MG tablet    Sig: TAKE 1 TABLET BY MOUTH TWICE A DAY AS NEEDED FOR MUSCLE SPASM    Dispense:  30 tablet    Refill:  3     Clemetine Marker, MD PGY-2 [facility]

## 2018-11-03 ENCOUNTER — Telehealth: Payer: Self-pay | Admitting: Family Medicine

## 2018-11-03 DIAGNOSIS — M25569 Pain in unspecified knee: Secondary | ICD-10-CM | POA: Diagnosis not present

## 2018-11-03 LAB — CBC
Hematocrit: 41.1 % (ref 34.0–46.6)
Hemoglobin: 13 g/dL (ref 11.1–15.9)
MCH: 27.1 pg (ref 26.6–33.0)
MCHC: 31.6 g/dL (ref 31.5–35.7)
MCV: 86 fL (ref 79–97)
Platelets: 332 10*3/uL (ref 150–450)
RBC: 4.79 x10E6/uL (ref 3.77–5.28)
RDW: 14.5 % (ref 11.7–15.4)
WBC: 7.1 10*3/uL (ref 3.4–10.8)

## 2018-11-03 NOTE — Telephone Encounter (Signed)
Called pt to inform her of negative pregnancy test results.

## 2018-11-04 ENCOUNTER — Ambulatory Visit: Payer: Medicaid Other | Admitting: Family Medicine

## 2018-11-04 DIAGNOSIS — M25569 Pain in unspecified knee: Secondary | ICD-10-CM | POA: Diagnosis not present

## 2018-11-04 DIAGNOSIS — N939 Abnormal uterine and vaginal bleeding, unspecified: Secondary | ICD-10-CM | POA: Insufficient documentation

## 2018-11-04 NOTE — Assessment & Plan Note (Signed)
1 day of regular bleeding followed by 2 days of spotting.  Continuing to spotting.  Last menstrual cycle was 10 months ago.  Most likely perimenopausal bleeding.  Will not get biopsy or ultrasound given that it has been less than 1 year.  Advised patient she can continue to have irregular cycles, but if she has spotting in between cycles or continued spotting, is to be reason to perform further work-up in the clinic. - CBC - POCT urine pregnancy test

## 2018-11-04 NOTE — Assessment & Plan Note (Signed)
Patient reports she is not currently smoking.  Today is her last day of Chantix.  Encouraged patient to continue smoking cessation.

## 2018-11-05 DIAGNOSIS — M25569 Pain in unspecified knee: Secondary | ICD-10-CM | POA: Diagnosis not present

## 2018-11-06 ENCOUNTER — Telehealth: Payer: Self-pay

## 2018-11-06 ENCOUNTER — Other Ambulatory Visit: Payer: Self-pay | Admitting: Family Medicine

## 2018-11-06 DIAGNOSIS — Z720 Tobacco use: Secondary | ICD-10-CM

## 2018-11-06 DIAGNOSIS — M25569 Pain in unspecified knee: Secondary | ICD-10-CM | POA: Diagnosis not present

## 2018-11-06 MED ORDER — CHANTIX STARTING MONTH PAK 0.5 MG X 11 & 1 MG X 42 PO TABS: ORAL_TABLET | ORAL | 0 refills | Status: DC

## 2018-11-06 NOTE — Telephone Encounter (Signed)
I called [name] to let her know that I have sent in a new Chantix pack for her.

## 2018-11-06 NOTE — Telephone Encounter (Signed)
Patient calls nurse line stating she needs "one more pack" of chantix. I do not see this on her medication list. Please advise.

## 2018-11-07 DIAGNOSIS — M25569 Pain in unspecified knee: Secondary | ICD-10-CM | POA: Diagnosis not present

## 2018-11-08 DIAGNOSIS — M25569 Pain in unspecified knee: Secondary | ICD-10-CM | POA: Diagnosis not present

## 2018-11-09 DIAGNOSIS — M25569 Pain in unspecified knee: Secondary | ICD-10-CM | POA: Diagnosis not present

## 2018-11-10 DIAGNOSIS — F3181 Bipolar II disorder: Secondary | ICD-10-CM | POA: Diagnosis not present

## 2018-11-10 DIAGNOSIS — M25569 Pain in unspecified knee: Secondary | ICD-10-CM | POA: Diagnosis not present

## 2018-11-11 DIAGNOSIS — M25569 Pain in unspecified knee: Secondary | ICD-10-CM | POA: Diagnosis not present

## 2018-11-12 ENCOUNTER — Telehealth: Payer: Self-pay | Admitting: Pharmacist

## 2018-11-12 DIAGNOSIS — M25569 Pain in unspecified knee: Secondary | ICD-10-CM | POA: Diagnosis not present

## 2018-11-12 NOTE — Telephone Encounter (Signed)
I am not sure what else either Dr. Shan Levans or I could say that is not in the original letter.  We can happily provide her another copy of that letter.  I agree that she will need a phone appointment with her PCP if she needs an additional letter.

## 2018-11-12 NOTE — Telephone Encounter (Signed)
-----   Message from Leavy Cella, [facility] sent at 10/29/2018  3:05 PM EDT ----- Regarding: tobacco cessation

## 2018-11-12 NOTE — Telephone Encounter (Signed)
LVM to call office back to inform her of below and see if a copy of the letter would be suffice or if it needs to be different we can assist her in getting an appointment with her PCP to determine this. April Zimmerman Rumple, CMA

## 2018-11-12 NOTE — Telephone Encounter (Signed)
Phone call follow-up  RE tobacco cessation.   Patient reports not smoking anything for ~ 2 weeks.  Last smoked a single "black and mild" when she was stressed.    Discussed thinking of herself as a non-smoker and agreed with plan to continue Varenicline (Chanitx) 1mg  BID with food (blue pills).     Follow-up plan - phone call in 1 week.   She expressed high level of stress and reported her hair is falling out.   NOTE:  Patient thinks she might need a new letter for her lawyer to support her move out of the [place].  I agreed to share this request with her PCP, Dr. Shan Levans and suggested they talk by phone to determine more details on the letter request.

## 2018-11-13 DIAGNOSIS — M25569 Pain in unspecified knee: Secondary | ICD-10-CM | POA: Diagnosis not present

## 2018-11-13 NOTE — Telephone Encounter (Signed)
Pt informed of below and said she was waiting to talk to her lawyer and then if she needs to make an appointment for a new letter she will call and schedule, routing to PCP and Dr. Andria Frames as an [place].April Zimmerman Rumple, CMA

## 2018-11-14 DIAGNOSIS — M25569 Pain in unspecified knee: Secondary | ICD-10-CM | POA: Diagnosis not present

## 2018-11-15 DIAGNOSIS — M25569 Pain in unspecified knee: Secondary | ICD-10-CM | POA: Diagnosis not present

## 2018-11-16 DIAGNOSIS — M25569 Pain in unspecified knee: Secondary | ICD-10-CM | POA: Diagnosis not present

## 2018-11-17 DIAGNOSIS — M25569 Pain in unspecified knee: Secondary | ICD-10-CM | POA: Diagnosis not present

## 2018-11-18 DIAGNOSIS — M25569 Pain in unspecified knee: Secondary | ICD-10-CM | POA: Diagnosis not present

## 2018-11-19 ENCOUNTER — Telehealth: Payer: Self-pay | Admitting: Pharmacist

## 2018-11-19 DIAGNOSIS — M25569 Pain in unspecified knee: Secondary | ICD-10-CM | POA: Diagnosis not present

## 2018-11-19 NOTE — Telephone Encounter (Signed)
Patient contact RE tobacco cessation.   Reports only smoking a few "black and milds" per week.  Most smoking was with stress.  She reports that she is hopeful she will be in a new apartment in the next few days/weeks.   She is committed to not smoke in her new apartment.   Reports dyspnea is much better and reports taking Dulera twice daily which has minimized use of albuterol.  She also reports improved breathing with exercise (walking flights of stairs at her daughter's apartment).   Encouraged continues use of Chantix at this time and expressed that I am hopeful she will completely quit in the near future.   Follow up planned by phone in 1 week.

## 2018-11-20 DIAGNOSIS — M25569 Pain in unspecified knee: Secondary | ICD-10-CM | POA: Diagnosis not present

## 2018-11-21 DIAGNOSIS — M25569 Pain in unspecified knee: Secondary | ICD-10-CM | POA: Diagnosis not present

## 2018-11-22 DIAGNOSIS — M25569 Pain in unspecified knee: Secondary | ICD-10-CM | POA: Diagnosis not present

## 2018-11-23 DIAGNOSIS — M25569 Pain in unspecified knee: Secondary | ICD-10-CM | POA: Diagnosis not present

## 2018-11-24 DIAGNOSIS — M25569 Pain in unspecified knee: Secondary | ICD-10-CM | POA: Diagnosis not present

## 2018-11-25 DIAGNOSIS — M25569 Pain in unspecified knee: Secondary | ICD-10-CM | POA: Diagnosis not present

## 2018-11-26 ENCOUNTER — Telehealth: Payer: Self-pay | Admitting: Pharmacist

## 2018-11-26 ENCOUNTER — Other Ambulatory Visit: Payer: Self-pay | Admitting: Family Medicine

## 2018-11-26 DIAGNOSIS — M25569 Pain in unspecified knee: Secondary | ICD-10-CM | POA: Diagnosis not present

## 2018-11-26 NOTE — Telephone Encounter (Signed)
Attempted phone call RE tobacco cessation.   No answer, left message.   Phone f/u planned in 2 weeks.

## 2018-11-27 DIAGNOSIS — M25569 Pain in unspecified knee: Secondary | ICD-10-CM | POA: Diagnosis not present

## 2018-11-28 DIAGNOSIS — M25569 Pain in unspecified knee: Secondary | ICD-10-CM | POA: Diagnosis not present

## 2018-11-29 DIAGNOSIS — M25569 Pain in unspecified knee: Secondary | ICD-10-CM | POA: Diagnosis not present

## 2018-11-30 DIAGNOSIS — M25569 Pain in unspecified knee: Secondary | ICD-10-CM | POA: Diagnosis not present

## 2018-12-01 DIAGNOSIS — M25569 Pain in unspecified knee: Secondary | ICD-10-CM | POA: Diagnosis not present

## 2018-12-02 DIAGNOSIS — M25569 Pain in unspecified knee: Secondary | ICD-10-CM | POA: Diagnosis not present

## 2018-12-03 DIAGNOSIS — M25569 Pain in unspecified knee: Secondary | ICD-10-CM | POA: Diagnosis not present

## 2018-12-04 DIAGNOSIS — M25569 Pain in unspecified knee: Secondary | ICD-10-CM | POA: Diagnosis not present

## 2018-12-05 DIAGNOSIS — M25569 Pain in unspecified knee: Secondary | ICD-10-CM | POA: Diagnosis not present

## 2018-12-06 DIAGNOSIS — M25569 Pain in unspecified knee: Secondary | ICD-10-CM | POA: Diagnosis not present

## 2018-12-07 DIAGNOSIS — M25569 Pain in unspecified knee: Secondary | ICD-10-CM | POA: Diagnosis not present

## 2018-12-08 ENCOUNTER — Telehealth: Payer: Self-pay | Admitting: Family Medicine

## 2018-12-08 DIAGNOSIS — M25569 Pain in unspecified knee: Secondary | ICD-10-CM | POA: Diagnosis not present

## 2018-12-08 NOTE — Telephone Encounter (Signed)
Patient needs another copy of the letters stating she got sick from mold in her home and one about her allergies.  She also needs a copy of office visit that mentions that she got sick from the mold in her home.  If you have any questions, please call her at [phone].

## 2018-12-09 ENCOUNTER — Other Ambulatory Visit: Payer: Self-pay

## 2018-12-09 ENCOUNTER — Ambulatory Visit (INDEPENDENT_AMBULATORY_CARE_PROVIDER_SITE_OTHER): Payer: Medicaid Other | Admitting: Family Medicine

## 2018-12-09 VITALS — BP 147/70 | HR 64 | Wt 271.4 lb

## 2018-12-09 DIAGNOSIS — M170 Bilateral primary osteoarthritis of knee: Secondary | ICD-10-CM | POA: Diagnosis not present

## 2018-12-09 DIAGNOSIS — M25569 Pain in unspecified knee: Secondary | ICD-10-CM | POA: Diagnosis not present

## 2018-12-09 MED ORDER — METHYLPREDNISOLONE ACETATE 40 MG/ML IJ SUSP
40.0000 mg | Freq: Once | INTRAMUSCULAR | Status: AC
  Administered 2018-12-09: 40 mg via INTRAMUSCULAR

## 2018-12-09 MED ORDER — METHYLPREDNISOLONE ACETATE 40 MG/ML IJ SUSP
40.0000 mg | Freq: Once | INTRAMUSCULAR | Status: AC
  Administered 2018-12-09: 14:00:00 40 mg via INTRAMUSCULAR

## 2018-12-09 MED ORDER — KNEE BRACE MISC: 1.0000 | 0 refills | Status: DC | PRN

## 2018-12-09 NOTE — Telephone Encounter (Signed)
Would you print the telemedicine appointment note from 8/16 as well as the last two letters on her chart, (the one from me and the one from Dr. Valentina Lucks) and place these up front for patient to pick up?  Thank you.

## 2018-12-09 NOTE — Progress Notes (Signed)
   HPI 53 year old female who presents for bilateral knee pain.  Patient has known severe osteoarthritis bilaterally.  She has been getting intermittent knee injections every 3 months to help with her pain.  Patient states that she has held off as long as she could have this time but decided it was finally time for some relief.  CC: Bilateral knee osteoarthritis   ROS:   Review of Systems See HPI for ROS.   CC, SH/smoking status, and VS noted  Objective: BP (!) 147/70   Pulse 64   Wt 271 lb 6.4 oz (123.1 kg)   SpO2 100%   BMI 33.92 kg/m  Gen: Very pleasant 53 year old African-American female, no acute distress CV: Regular rate and rhythm, no M/R/G Resp: Lungs clear to auscultation bilaterally, no accessory muscle use Neuro: Alert and oriented, Speech clear, No gross deficits Bilateral knee: Very minimal swelling noted bilateral knees.  Crepitus noted with flexion extension.  No muscle weakness of flexion extension.  Range of motion intact.  Tenderness palpation over medial joint line bilateral   Assessment and plan:  Osteoarthritis of knee Bilateral knee corticosteroid injections administered.  Patient with almost immediate relief from her symptoms.  Please see attached procedure note.  In regards to chronic management can follow-up with her PCP.  Did discuss the possibility of needing knee replacements with patient   No orders of the defined types were placed in this encounter.   Meds ordered this encounter  Medications  . Elastic Bandages & Supports (KNEE BRACE) MISC    Sig: 1 each by Does not apply route as needed.    Dispense:  2 each    Refill:  0    Knee brace for both knees  . methylPREDNISolone acetate (DEPO-MEDROL) injection 40 mg  . methylPREDNISolone acetate (DEPO-MEDROL) injection 40 mg    Guadalupe Dawn MD PGY-3 Family Medicine Resident  12/09/2018 2:00 PM

## 2018-12-09 NOTE — Patient Instructions (Signed)
It was great seeing you again!  I am glad that we can get you knee injections help you feel better before your birthday trip.  Please let me know if you are interested in getting set up with some of these alternative injections.  These are oftentimes quite expensive and Medicaid usually does not cover them.  If you become interested in here placement for either knee also please let us know.  I gave you a prescription for 2 knee braces he can pick up from any medical supply store in the area.  [facility] 3.5   (52)  Medical supply store [place], [place] Open ? Closes 6PM  [phone]  [facility] 2.0   (3)  Medical supply store [address] Open ? Closes 6PM  [phone]  [place] 4.6   [phone])  Medical supply store [place] Open ? Closes 5:30PM  [phone]  In-store shopping  [place] 3.5   [phone])  Medical supply store [address] Open ? Closes 6PM  [phone]  In-store shopping  [place] Supply 4.0   (2)  Medical supply store [place] now  [phone]  [place] 2.7   (28)  Medical supply store [address] Open ? Closes 5PM  [phone]  [facility] 5.0   (7)  Medical supply store [address] Open ? Closes 5PM  [phone]  In-store shopping In-store pickup

## 2018-12-09 NOTE — Assessment & Plan Note (Signed)
Bilateral knee corticosteroid injections administered.  Patient with almost immediate relief from her symptoms.  Please see attached procedure note.  In regards to chronic management can follow-up with her PCP.  Did discuss the possibility of needing knee replacements with patient

## 2018-12-10 ENCOUNTER — Telehealth: Payer: Self-pay | Admitting: Pharmacist

## 2018-12-10 DIAGNOSIS — M25569 Pain in unspecified knee: Secondary | ICD-10-CM | POA: Diagnosis not present

## 2018-12-10 NOTE — Telephone Encounter (Signed)
Contact RE tobacco cessation.   Patient reports continued abstinence.  She shared that she is no longer taking any Chantix.   She also shared that she is a new grandmother (her daughter had a 27week boy who continues to be in the NICU).  She was excited.   She expressed concern over her weight and desires to work on weight loss in the near future.  She is planning to go to the beach in early October and requested phone follow-up in early October.   I continued to congratulate her on her successful cessation from tobacco.   She has  NOT yet been moved out of her "moldy apartment" (she is living with a variety of family and motels).

## 2018-12-11 DIAGNOSIS — M25569 Pain in unspecified knee: Secondary | ICD-10-CM | POA: Diagnosis not present

## 2018-12-12 ENCOUNTER — Encounter: Payer: Self-pay | Admitting: Family Medicine

## 2018-12-12 DIAGNOSIS — M25569 Pain in unspecified knee: Secondary | ICD-10-CM | POA: Diagnosis not present

## 2018-12-12 NOTE — Progress Notes (Signed)
Right knee injection Performing physician: Dr. Guadalupe Dawn Supervising physician: Dr. Madison Hickman Preprocedure diagnosis: Osteoarthritis of right knee Postprocedure diagnosis osteoarthritis of right knee Procedure: Informed consent was obtained after risks benefits and alternatives were discussed with patient.  Area disinfected with iodine swab.  Further disinfected with alcohol swab x1.  Area sprayed with ethyl chloride cold spray.  A 4:1 lidocaine to Depo-Medrol injection was injected into the knee using an anterolateral knee approach.  Bandage was placed over the wound.  Right knee injection Performing physician: Dr. Guadalupe Dawn Supervising physician: Dr. Madison Hickman Preprocedure diagnosis: Osteoarthritis of left knee Postprocedure diagnosis osteoarthritis of left knee Procedure: Informed consent was obtained after risks benefits and alternatives were discussed with patient.  Area disinfected with iodine swab.  Further disinfected with alcohol swab x1.  Area sprayed with ethyl chloride cold spray.  A 4:1 lidocaine to Depo-Medrol injection was injected into the knee using an anterolateral knee approach.  Bandage was placed over the wound.  Guadalupe Dawn MD PGY-3 Family Medicine Resident

## 2018-12-13 DIAGNOSIS — M25569 Pain in unspecified knee: Secondary | ICD-10-CM | POA: Diagnosis not present

## 2018-12-14 DIAGNOSIS — M25569 Pain in unspecified knee: Secondary | ICD-10-CM | POA: Diagnosis not present

## 2018-12-15 DIAGNOSIS — M25569 Pain in unspecified knee: Secondary | ICD-10-CM | POA: Diagnosis not present

## 2018-12-15 NOTE — Telephone Encounter (Signed)
Routed to [facility] team to take care of getting the correct information printed. [name], CMA

## 2018-12-16 DIAGNOSIS — M25569 Pain in unspecified knee: Secondary | ICD-10-CM | POA: Diagnosis not present

## 2018-12-17 DIAGNOSIS — M25569 Pain in unspecified knee: Secondary | ICD-10-CM | POA: Diagnosis not present

## 2018-12-18 DIAGNOSIS — M25569 Pain in unspecified knee: Secondary | ICD-10-CM | POA: Diagnosis not present

## 2018-12-19 DIAGNOSIS — M25569 Pain in unspecified knee: Secondary | ICD-10-CM | POA: Diagnosis not present

## 2018-12-20 DIAGNOSIS — M25569 Pain in unspecified knee: Secondary | ICD-10-CM | POA: Diagnosis not present

## 2018-12-21 DIAGNOSIS — M25569 Pain in unspecified knee: Secondary | ICD-10-CM | POA: Diagnosis not present

## 2018-12-22 DIAGNOSIS — M25569 Pain in unspecified knee: Secondary | ICD-10-CM | POA: Diagnosis not present

## 2018-12-23 DIAGNOSIS — M25569 Pain in unspecified knee: Secondary | ICD-10-CM | POA: Diagnosis not present

## 2018-12-24 DIAGNOSIS — M25569 Pain in unspecified knee: Secondary | ICD-10-CM | POA: Diagnosis not present

## 2018-12-25 DIAGNOSIS — M25569 Pain in unspecified knee: Secondary | ICD-10-CM | POA: Diagnosis not present

## 2018-12-26 DIAGNOSIS — M25569 Pain in unspecified knee: Secondary | ICD-10-CM | POA: Diagnosis not present

## 2018-12-27 DIAGNOSIS — M25569 Pain in unspecified knee: Secondary | ICD-10-CM | POA: Diagnosis not present

## 2018-12-28 DIAGNOSIS — M25569 Pain in unspecified knee: Secondary | ICD-10-CM | POA: Diagnosis not present

## 2018-12-29 DIAGNOSIS — M25569 Pain in unspecified knee: Secondary | ICD-10-CM | POA: Diagnosis not present

## 2018-12-30 DIAGNOSIS — M25569 Pain in unspecified knee: Secondary | ICD-10-CM | POA: Diagnosis not present

## 2018-12-31 DIAGNOSIS — M25569 Pain in unspecified knee: Secondary | ICD-10-CM | POA: Diagnosis not present

## 2019-01-01 ENCOUNTER — Telehealth: Payer: Self-pay | Admitting: Pharmacist

## 2019-01-01 DIAGNOSIS — M25569 Pain in unspecified knee: Secondary | ICD-10-CM | POA: Diagnosis not present

## 2019-01-01 NOTE — Telephone Encounter (Signed)
Called patient RE tobacco cessation.   Patient continues to live with her daughter and has not yet moved out from her "moldy" apartment housing.  She has moved her belongings into storage.   She reports smoking ~ 6 cigarettes per day LAST week BUT has resumed her varencline and has not smoked for the last few days.   She reports insomnia, weight loss and sounded dejected while on the phone.  I shared that I would communicate our conversation with Dr. Shan Levans, her PCP.   She did not have any request for additional support or evaluation from me as we concluded our conversation.      I plan to follow-up in 10-14 days.

## 2019-01-01 NOTE — Telephone Encounter (Signed)
-----   Message from Leavy Cella, [facility] sent at 12/10/2018  2:42 PM EDT ----- Regarding: Tobacco Cessation Follow-up

## 2019-01-02 DIAGNOSIS — M25569 Pain in unspecified knee: Secondary | ICD-10-CM | POA: Diagnosis not present

## 2019-01-03 DIAGNOSIS — M25569 Pain in unspecified knee: Secondary | ICD-10-CM | POA: Diagnosis not present

## 2019-01-04 DIAGNOSIS — M25569 Pain in unspecified knee: Secondary | ICD-10-CM | POA: Diagnosis not present

## 2019-01-05 DIAGNOSIS — M25569 Pain in unspecified knee: Secondary | ICD-10-CM | POA: Diagnosis not present

## 2019-01-06 DIAGNOSIS — M25569 Pain in unspecified knee: Secondary | ICD-10-CM | POA: Diagnosis not present

## 2019-01-07 ENCOUNTER — Ambulatory Visit (INDEPENDENT_AMBULATORY_CARE_PROVIDER_SITE_OTHER): Payer: Medicaid Other | Admitting: Pharmacist

## 2019-01-07 ENCOUNTER — Encounter: Payer: Self-pay | Admitting: Pharmacist

## 2019-01-07 ENCOUNTER — Other Ambulatory Visit: Payer: Self-pay

## 2019-01-07 ENCOUNTER — Other Ambulatory Visit: Payer: Self-pay | Admitting: Family Medicine

## 2019-01-07 DIAGNOSIS — J449 Chronic obstructive pulmonary disease, unspecified: Secondary | ICD-10-CM

## 2019-01-07 DIAGNOSIS — Z720 Tobacco use: Secondary | ICD-10-CM

## 2019-01-07 DIAGNOSIS — F17200 Nicotine dependence, unspecified, uncomplicated: Secondary | ICD-10-CM

## 2019-01-07 DIAGNOSIS — M25569 Pain in unspecified knee: Secondary | ICD-10-CM | POA: Diagnosis not present

## 2019-01-07 MED ORDER — TRAMADOL-ACETAMINOPHEN 37.5-325 MG PO TABS: 1.0000 | ORAL_TABLET | Freq: Three times a day (TID) | ORAL | 0 refills | Status: DC | PRN

## 2019-01-07 MED ORDER — CYCLOBENZAPRINE HCL 10 MG PO TABS: ORAL_TABLET | ORAL | 3 refills | Status: DC

## 2019-01-07 MED ORDER — DULERA 200-5 MCG/ACT IN AERO: 2.0000 | INHALATION_SPRAY | Freq: Two times a day (BID) | RESPIRATORY_TRACT | 5 refills | Status: DC

## 2019-01-07 MED ORDER — VARENICLINE TARTRATE 1 MG PO TABS: 1.0000 mg | ORAL_TABLET | Freq: Two times a day (BID) | ORAL | 4 refills | Status: DC

## 2019-01-07 NOTE — Progress Notes (Signed)
Reviewed: Agree with Dr. Koval's documentation and management. 

## 2019-01-07 NOTE — Patient Instructions (Addendum)
Great to see you today.     Happy you have quit smoking.   Continue the varenicline (Chantix) 1 pill twice daily. A new script was sent for you to pick up.   Please continue taking the your inhaler Ruthe Mannan) twice daily.  1 or 2 puffs dependent on symptoms. Try to blow through nose instead of mouth after using Dulera.  Use your albuterol only if needed for shortness of breath. A new prescription for [facility] was sent.   Continue taking ibuprofen for your back pain. New scripts were sent for cyclobenzaprine and tramadol/acetaminophen by Dr. Shan Levans for your back pain as well.   Follow up with Dr. Valentina Lucks in 4-6 weeks for smoking cessation and with Dr. Shan Levans within the next week.

## 2019-01-07 NOTE — Assessment & Plan Note (Signed)
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    Continued Chantix 1 mg twice daily. Patient reports she loves "how it helps her not smoke". Will follow up with her in 4-6 weeks to assess her smoking cessation.

## 2019-01-07 NOTE — Progress Notes (Signed)
   S:  Patient arrives ambulating well, but in a reserved mood.   Patient arrives for evaluation/assistance with tobacco dependence.   Patient was referred by Dr. Burr Medico on 09/07/2018.  Patient was last seen by Primary Care Provider, Dr. Shan Levans, on 10/29/2018.   Age when started using tobacco on a daily basis 76. Number of cigarettes/day: has not smoked in the past 3 days since re-starting varenicline (Chantix).   Medications used in past cessation efforts include: current use of varenicline only  Motivation to quit: being able to see her grandkids, specifically her daughter's baby who was just born.   Patient reports multiple stressors are still continuing in her life such as living with her daughter and not being able to find a place of her own due to issues with Covid and Section 8 housing opportunities. She states she had initially increased her smoking habits but when re-started Chantix has not had a cigarette since. She reports titrating her alprazolam dose from 2 times daily to 4 times daily (maximum prescribed amount), which she feels helps manage her stress.  She has also still been having back pain that she states was previously relieved with tramadol/acetaminophen and cyclobenzaprine. She has recently ran out of both of those prescriptions. Now she is taking 2.5 tablets (500 mg) of ibuprofen twice daily which provide relief. She is not taking meloxicam as she was told to not take the meloxicam with ibuprofen "by someone".   She reported complaints of productive cough with some post-nasal drip and requested a prescription for codeine cough syrup. She finished her Dulera inhaler yesterday which could be the underlying cause of some cough.    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    Continued Chantix 1 mg twice daily. Patient reports  she loves "how it helps her not smoke". Will follow up with her in 4-6 weeks to assess her smoking cessation.   COPD/asthma: refilled the patients Dulera and educated her to increase her current use of 1 puff twice daily to 2 puffs twice daily if she feels she is not having control and her cough/secretions don't subside. Also encouraged the patient to breathe out of her nose instead of mouth after administration of inhaler. Encouraged ample hydration to facilitate mucus secretion as well.   Back pain: Spoke with Dr. Shan Levans to refill Ms. Morelands cyclobenzaprine and tramadol/acetaminophen.   Anxiety: Encouraged patient to reach out to Dr. Reece Levy for refilling prescriptions such as alprazolam and zolpidem.    Written information provided.  F/U Rx Clinic Visit via phone in 4-6 weeks, will follow up with Dr. Shan Levans within the next week.  Total time in face-to-face counseling 30 minutes.  Patient seen with Donald Prose, PharmD Candidate and Eddie Candle, PharmD PGY-1 Resident.

## 2019-01-07 NOTE — Assessment & Plan Note (Signed)
COPD/asthma: refilled the patients Dulera and educated her to increase her current use of 1 puff twice daily to 2 puffs twice daily if she feels she is not having control and her cough/secretions don't subside. Also encouraged the patient to breathe out of her nose instead of mouth after administration of inhaler. Encouraged ample hydration to facilitate mucus secretion as well.

## 2019-01-08 ENCOUNTER — Telehealth: Payer: Self-pay | Admitting: *Deleted

## 2019-01-08 DIAGNOSIS — M25569 Pain in unspecified knee: Secondary | ICD-10-CM | POA: Diagnosis not present

## 2019-01-08 NOTE — Telephone Encounter (Signed)
Prior approval for ultracet completed via [employer].  Med approved for 01/08/2019 - 07/07/2019.  LMOVM of pharmacy.  Christen Bame, CMA

## 2019-01-09 DIAGNOSIS — M25569 Pain in unspecified knee: Secondary | ICD-10-CM | POA: Diagnosis not present

## 2019-01-10 DIAGNOSIS — M25569 Pain in unspecified knee: Secondary | ICD-10-CM | POA: Diagnosis not present

## 2019-01-11 DIAGNOSIS — M25569 Pain in unspecified knee: Secondary | ICD-10-CM | POA: Diagnosis not present

## 2019-01-12 DIAGNOSIS — M25569 Pain in unspecified knee: Secondary | ICD-10-CM | POA: Diagnosis not present

## 2019-01-13 DIAGNOSIS — M25569 Pain in unspecified knee: Secondary | ICD-10-CM | POA: Diagnosis not present

## 2019-01-14 DIAGNOSIS — M25569 Pain in unspecified knee: Secondary | ICD-10-CM | POA: Diagnosis not present

## 2019-01-15 ENCOUNTER — Telehealth: Payer: Self-pay | Admitting: Pharmacist

## 2019-01-15 DIAGNOSIS — M25569 Pain in unspecified knee: Secondary | ICD-10-CM | POA: Diagnosis not present

## 2019-01-15 NOTE — Telephone Encounter (Signed)
Phone call RE tobacco cessation.   Patient reports that she is still smoking a black and mild intermittently (not every day) when stressed.   Also shared that "city inspectors" came to her moldy apartment and declared that it was safe yesterday 10/22.  I shared that this was disheartening news.  She is resigned to try and move out, find her own apartment and then attempt to find section 8 housing again in a few months.   I agreed to attempt follow-up her in 3 weeks.

## 2019-01-16 DIAGNOSIS — M25569 Pain in unspecified knee: Secondary | ICD-10-CM | POA: Diagnosis not present

## 2019-01-17 DIAGNOSIS — M25569 Pain in unspecified knee: Secondary | ICD-10-CM | POA: Diagnosis not present

## 2019-01-18 DIAGNOSIS — M25569 Pain in unspecified knee: Secondary | ICD-10-CM | POA: Diagnosis not present

## 2019-01-19 DIAGNOSIS — M25569 Pain in unspecified knee: Secondary | ICD-10-CM | POA: Diagnosis not present

## 2019-01-20 DIAGNOSIS — M25569 Pain in unspecified knee: Secondary | ICD-10-CM | POA: Diagnosis not present

## 2019-01-21 DIAGNOSIS — M25569 Pain in unspecified knee: Secondary | ICD-10-CM | POA: Diagnosis not present

## 2019-01-22 DIAGNOSIS — M25569 Pain in unspecified knee: Secondary | ICD-10-CM | POA: Diagnosis not present

## 2019-01-23 DIAGNOSIS — M25569 Pain in unspecified knee: Secondary | ICD-10-CM | POA: Diagnosis not present

## 2019-01-24 DIAGNOSIS — M25569 Pain in unspecified knee: Secondary | ICD-10-CM | POA: Diagnosis not present

## 2019-01-25 DIAGNOSIS — M25569 Pain in unspecified knee: Secondary | ICD-10-CM | POA: Diagnosis not present

## 2019-01-26 DIAGNOSIS — M25569 Pain in unspecified knee: Secondary | ICD-10-CM | POA: Diagnosis not present

## 2019-01-27 DIAGNOSIS — M25569 Pain in unspecified knee: Secondary | ICD-10-CM | POA: Diagnosis not present

## 2019-01-28 ENCOUNTER — Other Ambulatory Visit: Payer: Self-pay

## 2019-01-28 ENCOUNTER — Telehealth (INDEPENDENT_AMBULATORY_CARE_PROVIDER_SITE_OTHER): Payer: Medicaid Other | Admitting: Family Medicine

## 2019-01-28 DIAGNOSIS — G4489 Other headache syndrome: Secondary | ICD-10-CM | POA: Diagnosis not present

## 2019-01-28 DIAGNOSIS — G8929 Other chronic pain: Secondary | ICD-10-CM

## 2019-01-28 DIAGNOSIS — M25569 Pain in unspecified knee: Secondary | ICD-10-CM | POA: Diagnosis not present

## 2019-01-28 DIAGNOSIS — M25562 Pain in left knee: Secondary | ICD-10-CM

## 2019-01-28 NOTE — Assessment & Plan Note (Signed)
Because of the nature of this virtual visit it was difficult to obtain a full neuro assessment to complete examination.  I had advised patient to seek medical attention at urgent care for further evaluation.  She may need to be referred to an allergist for mold testing and possibly neuro consult for evaluation of headaches. Patient agreeable to advice.

## 2019-01-28 NOTE — Progress Notes (Signed)
[facility] Tele[facility]icine Visit  Patient consented to have virtual visit. Method of visit: Video not available as patient did not have Internet access, telephone interview completed   Encounter participants: Patient: [name] - located at home Provider: Carollee Leitz - located at home Others (if applicable): none  Chief Complaint: headaches and left knee pain  HPI:  Headaches Patient reports having headaches that started last night.  She reports having pain bilateral temple areas of feel like someone is hitting her there.  She also reports dizziness, eye heaviness, light blue blurriness. She denies any shortness of breath, chest pain or weakness.  She has had similar episodes in the past.  She had moved out of her apartment in April 2020 due to mold situation.  Headache free during this time until yesterday when she moved back into the same apartment.  She was told that the mole has been resolved.  She tried Advil/ibuprofen without any relief.  Tylenol did not was not effective.  The only thing that worked for her was Ultracet but is not covered on her insurance she is having difficulty getting.  She reports her headache as coming and going and thinks that the headaches are resolved of ongoing mold issues.  She has had no seizures, visual loss, numbness or tingling.  Lt knee pain Patient also complains of having left knee pain.  She uses a sports brace and reports that after taking the brace off she noted swelling around the knee.  She feels that the knee needs to be drained.  She reports no redness or warmth at the site.  She is able to bend her knee slightly but it induces pain.  She is also able to walk, she uses a walker, which also induces pain.  Was seen in clinic in September 2020 for bilateral knee injections.  At that time was discussed possible knee replacement.  She currently denies any recent trauma, numbness or tingling. ROS: per HPI  Pertinent PMHx:  Asthma, COPD, peripheral neuropathy, plica syndrome, anxiety, bipolar, tobacco abuse.  Exam:  Respiratory: No increased work of breathing.  Patient able to speak in complete sentences.  No audible wheezing heard.  Assessment/Plan:  Headache Because of the nature of this virtual visit it was difficult to obtain a full neuro assessment to complete examination.  I had advised patient to seek medical attention at urgent care for further evaluation.  She may need to be referred to an allergist for mold testing and possibly neuro consult for evaluation of headaches. Patient agreeable to advice.  Left knee pain Given the chronic nature of the patient's knee pain I have advised her to seek medical attention at the urgent care center or follow-up in clinic next week.  She may benefit from orthopedic consult for management of knee pain. Patient agreeable to be seen at urgent care.    Time spent during visit with patient: 30 minutes

## 2019-01-28 NOTE — Assessment & Plan Note (Signed)
>>  ASSESSMENT AND PLAN FOR LEFT KNEE PAIN WRITTEN ON 01/28/2019  6:38 PM BY Dana Allan, MD  Given the chronic nature of the patient's knee pain I have advised her to seek medical attention at the urgent care center or follow-up in clinic next week.  She may benefit from orthopedic consult for management of knee pain. Patient agreeable to be seen at urgent care.

## 2019-01-28 NOTE — Assessment & Plan Note (Signed)
Given the chronic nature of the patient's knee pain I have advised her to seek medical attention at the urgent care center or follow-up in clinic next week.  She may benefit from orthopedic consult for management of knee pain. Patient agreeable to be seen at urgent care.

## 2019-01-29 ENCOUNTER — Other Ambulatory Visit: Payer: Self-pay

## 2019-01-29 ENCOUNTER — Ambulatory Visit (HOSPITAL_COMMUNITY)
Admission: EM | Admit: 2019-01-29 | Discharge: 2019-01-29 | Disposition: A | Discharge status: Home / Self Care | Payer: Medicaid Other | Attending: Family Medicine | Admitting: Family Medicine

## 2019-01-29 ENCOUNTER — Encounter (HOSPITAL_COMMUNITY): Payer: Self-pay | Admitting: Family Medicine

## 2019-01-29 DIAGNOSIS — M1712 Unilateral primary osteoarthritis, left knee: Secondary | ICD-10-CM | POA: Diagnosis not present

## 2019-01-29 DIAGNOSIS — M25569 Pain in unspecified knee: Secondary | ICD-10-CM | POA: Diagnosis not present

## 2019-01-29 MED ORDER — TRAMADOL-ACETAMINOPHEN 37.5-325 MG PO TABS: 1.0000 | ORAL_TABLET | Freq: Three times a day (TID) | ORAL | 0 refills | Status: DC | PRN

## 2019-01-29 MED ORDER — METHYLPREDNISOLONE ACETATE 80 MG/ML IJ SUSP
40.0000 mg | Freq: Once | INTRAMUSCULAR | Status: AC
  Administered 2019-01-29: 40 mg via INTRA_ARTICULAR

## 2019-01-29 MED ORDER — METHYLPREDNISOLONE ACETATE 40 MG/ML IJ SUSP
INTRAMUSCULAR | Status: AC
  Filled 2019-01-29: qty 1

## 2019-01-29 MED ORDER — BUPIVACAINE HCL (PF) 0.5 % IJ SOLN
INTRAMUSCULAR | Status: AC
  Filled 2019-01-29: qty 10

## 2019-01-29 NOTE — ED Triage Notes (Signed)
Patient presents to Urgent Care with complaints of left knee pain since yesterday. Patient reports she "needs fluid drained off of it and a steroid shot" so she can stand to say her vows for her wedding in 2 days.

## 2019-01-29 NOTE — Discharge Instructions (Signed)
Please return if pain persists

## 2019-01-29 NOTE — ED Provider Notes (Signed)
[name]    CSN: [id number] Arrival date & time: 01/29/19  0846      History   Chief Complaint Chief Complaint  Patient presents with  . Knee Pain    HPI [name] is a 53 y.o. female.   Initial patient visit  53 yo with swollen knee and headache.  The knees have been sore for over a year and she is contemplating a total knee operation on each of them.  She is getting married this weekend and would like the more painful left knee injected.  She also has had a mold problem in her apartment, with recent remediation, but continued problems with leaky water.  She has a dull headache, presumably from the fumes.     Past Medical History:  Diagnosis Date  . Anxiety   . Arthritis   . Bipolar 1 disorder ([place])   . 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 Depression   . ECZEMA, ATOPIC DERMATITIS 08/25/2007   Qualifier: Diagnosis of  By: Darylene Price MD, Elta Guadeloupe    . GERD (gastroesophageal reflux disease)   . LOW BACK PAIN, CHRONIC 06/30/2006   Qualifier: Diagnosis of  By: Darylene Price MD, Elta Guadeloupe    . OVARIAN CYST 08/12/2007   Qualifier: Diagnosis of  By: Darylene Price MD, Elta Guadel[place]    . Panic attacks   . 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 TOBACCO ABUSE 04/13/2008  . VARICOSE VEINS, LOWER EXTREMITIES 06/30/2006   Qualifier: Diagnosis of  By: Darylene Price MD, Mark      Patient Active Problem List   Diagnosis Date Noted  . Vaginal bleeding 11/04/2018  . Vaginal irritation 10/06/2018  . 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 Right knee pain 09/08/2018  . Suspected exposure to mold 09/08/2018  . Nasal congestion 08/13/2018  . Wheezing 08/13/2018  . Congestion of nasal sinus 08/03/2018  . Vasomotor symptoms due to menopause 04/23/2018  . Left knee pain 03/10/2018  . Loss of balance 08/08/2014  . Peripheral neuropathy 08/08/2014  . Acute periapical abscess 05/19/2014  . Hidradenitis  suppurativa 12/07/2013  . Mouth pain 05/18/2012  . GERD (gastroesophageal reflux disease) 04/08/2012  . Headache 04/08/2012  . Osteoarthritis of knee 03/05/2012  . Arthritis of knee, degenerative 03/05/2012  . 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 SHOULDER PAIN, RIGHT 04/28/2009  . ELBOW PAIN, RIGHT 10/03/2008  . TOBACCO ABUSE 04/13/2008  . KNEE PAIN, RIGHT, CHRONIC 04/01/2008  . IRREGULAR MENSES 11/03/2007  . ANXIETY STATE NOS 02/10/2007  . DISORDER, BIPOLAR NOS 10/29/2006  . VARICOSE VEINS, LOWER EXTREMITIES 06/30/2006  . LOW BACK PAIN, CHRONIC 06/30/2006  . INSOMNIA 06/30/2006    Past Surgical History:  Procedure Laterality Date  . KNEE CARTILAGE SURGERY  2013   right     OB History   No obstetric history on file.      Home Medications    Prior to Admission medications   Medication Sig Start Date End Date Taking? Authorizing Provider  albuterol (VENTOLIN HFA) 108 (90 Base) MCG/ACT inhaler Inhale 2 puffs into the lungs every 4 (four) hours as needed for wheezing or shortness of breath. Patient not taking: Reported on 01/07/2019 09/07/18   Everrett Coombe, MD  ALPRAZolam Duanne Moron) 1 MG tablet Take 1 mg by mouth 4 (four) times daily as needed for anxiety. For anxiety    [provider]  cyclobenzaprine (FLEXERIL) 10 MG tablet TAKE 1 TABLET BY  MOUTH TWICE A DAY AS NEEDED FOR MUSCLE SPASM 01/07/19   Lennox SoldersWinfrey, Amanda C, MD  divalproex (DEPAKOTE) 250 MG DR tablet Take 250 mg by mouth 3 (three) times daily.     [provider]  Ibuprofen 200 MG CAPS Take 400-600 capsules by mouth 3 (three) times daily.    [provider]  mometasone-formoterol (DULERA) 200-5 MCG/ACT AERO Inhale 2 puffs into the lungs 2 (two) times daily. 01/07/19   Moses MannersHensel, William A, MD  pantoprazole (PROTONIX) 40 MG tablet TAKE 1 TABLET BY MOUTH EVERY DAY 11/27/18   Lennox SoldersWinfrey, Amanda C, MD  traMADol-acetaminophen (ULTRACET) 37.5-325 MG tablet Take 1 tablet by mouth every 8 (eight) hours as needed.  01/29/19   Elvina SidleLauenstein, Shia Delaine, MD  varenicline (CHANTIX) 1 MG tablet Take 1 tablet (1 mg total) by mouth 2 (two) times daily. 01/07/19   Moses MannersHensel, William A, MD  zolpidem (AMBIEN) 10 MG tablet Take 10 mg by mouth at bedtime as needed for sleep.    [provider]    Family History Family History  Problem Relation Age of Onset  . Hypertension Mother   . Diabetes Mother   . Diabetes Father   . Hypertension Father     Social History Social History   Tobacco Use  . Smoking status: Former Smoker    Packs/day: 0.50    Years: 36.00    Pack years: 18.00    Types: Cigarettes    Start date: 03/25/1981    Quit date: 08/29/2018    Years since quitting: 0.4  . Smokeless tobacco: Never Used  Substance Use Topics  . Alcohol use: Yes  . Drug use: Yes    Types: Marijuana     Allergies   Cetirizine & related   Review of Systems Review of Systems   Physical Exam Triage Vital Signs ED Triage Vitals  Enc Vitals Group     BP      Pulse      Resp      Temp      Temp src      SpO2      Weight      Height      Head Circumference      Peak Flow      Pain Score      Pain Loc      Pain Edu?      Excl. in GC?    No data found.  Updated Vital Signs BP (!) 159/92 (BP Location: Right Arm)   Pulse 76   Temp 98.2 F (36.8 C) (Oral)   Resp 16   SpO2 100%    Physical Exam Vitals signs and nursing note reviewed.  Constitutional:      General: She is not in acute distress.    Appearance: Normal appearance. She is obese.  HENT:     Head: Atraumatic.  Eyes:     Conjunctiva/sclera: Conjunctivae normal.     Pupils: Pupils are equal, round, and reactive to light.  Neck:     Musculoskeletal: Normal range of motion and neck supple.  Cardiovascular:     Rate and Rhythm: Normal rate.  Pulmonary:     Effort: Pulmonary effort is normal.  Musculoskeletal: Normal range of motion.        General: Swelling and tenderness present. No deformity.     Comments: Tender left knee along  joint line both medially and laterally, with moderate palpable effusion and full ROM  Neurological:     Mental Status:  She is alert.     Gait: Gait abnormal.      UC Treatments / Results  Labs (all labs ordered are listed, but only abnormal results are displayed) Labs Reviewed - No data to display   Radiology No results found.  Procedures Join Aspiration/Injection  Date/Time: 01/29/2019 9:53 AM Performed by: Elvina Sidle, MD Authorized by: Elvina Sidle, MD   Consent:    Consent obtained:  Verbal   Consent given by:  Patient   Risks discussed:  Infection and incomplete drainage   Alternatives discussed:  No treatment Location:    Location:  Knee   Knee:  L knee Anesthesia (see MAR for exact dosages):    Anesthesia method:  Topical application Procedure details:    Needle gauge:  22 G   Ultrasound guidance: no     Approach:  Lateral   Aspirate characteristics:  Clear   Steroid injected: yes     Specimen collected: no   Post-procedure details:    Patient tolerance of procedure:  Tolerated well, no immediate complications   (including critical care time)  Medications Ordered in UC Medications  methylPREDNISolone acetate (DEPO-MEDROL) injection 40 mg (has no administration in time range)  methylPREDNISolone acetate (DEPO-MEDROL) 40 MG/ML injection (has no administration in time range)  bupivacaine (MARCAINE) 0.5 % injection (has no administration in time range)    Initial Impression / Assessment and Plan / UC Course  I have reviewed the triage vital signs and the nursing notes.  Pertinent labs & imaging results that were available during my care of the patient were reviewed by me and considered in my medical decision making (see chart for details).    Final Clinical Impressions(s) / UC Diagnoses   Final diagnoses:  Primary osteoarthritis of left knee     Discharge Instructions     Please return if pain persists     ED Prescriptions     Medication Sig Dispense Auth. Provider   traMADol-acetaminophen (ULTRACET) 37.5-325 MG tablet Take 1 tablet by mouth every 8 (eight) hours as needed. 15 tablet Elvina Sidle, MD     I have reviewed the PDMP during this encounter.   Elvina Sidle, MD 01/29/19 [phone]

## 2019-01-30 DIAGNOSIS — M25569 Pain in unspecified knee: Secondary | ICD-10-CM | POA: Diagnosis not present

## 2019-01-31 DIAGNOSIS — M25569 Pain in unspecified knee: Secondary | ICD-10-CM | POA: Diagnosis not present

## 2019-02-01 DIAGNOSIS — M25569 Pain in unspecified knee: Secondary | ICD-10-CM | POA: Diagnosis not present

## 2019-02-02 DIAGNOSIS — M25569 Pain in unspecified knee: Secondary | ICD-10-CM | POA: Diagnosis not present

## 2019-02-03 DIAGNOSIS — M25569 Pain in unspecified knee: Secondary | ICD-10-CM | POA: Diagnosis not present

## 2019-02-04 DIAGNOSIS — M25569 Pain in unspecified knee: Secondary | ICD-10-CM | POA: Diagnosis not present

## 2019-02-05 ENCOUNTER — Telehealth: Payer: Self-pay | Admitting: Pharmacist

## 2019-02-05 DIAGNOSIS — M25569 Pain in unspecified knee: Secondary | ICD-10-CM | POA: Diagnosis not present

## 2019-02-05 NOTE — Telephone Encounter (Signed)
Contacted patient RE tobacco cessation.    Patient reported that she has moved back into her old apartment (with the mold) and has been having headaches which she believe are due to the environment.    She continues to smoke but only a limited amount of "black and milds" - 1 can last her four days.    She reports recently getting married AND she also shared that her grandson who was premature and has been in the NICU is expected to come home to her daughter within the next week.    I encouraged continued restraint from smoking stress induced cigarettes and at this time we plan to continue with varenicline 1mg  BID.    Follow-up plan was agreed to be ~ 2 weeks.

## 2019-02-06 DIAGNOSIS — M25569 Pain in unspecified knee: Secondary | ICD-10-CM | POA: Diagnosis not present

## 2019-02-07 DIAGNOSIS — M25569 Pain in unspecified knee: Secondary | ICD-10-CM | POA: Diagnosis not present

## 2019-02-08 DIAGNOSIS — M25569 Pain in unspecified knee: Secondary | ICD-10-CM | POA: Diagnosis not present

## 2019-02-09 DIAGNOSIS — M25569 Pain in unspecified knee: Secondary | ICD-10-CM | POA: Diagnosis not present

## 2019-02-10 DIAGNOSIS — M25569 Pain in unspecified knee: Secondary | ICD-10-CM | POA: Diagnosis not present

## 2019-02-11 DIAGNOSIS — M25569 Pain in unspecified knee: Secondary | ICD-10-CM | POA: Diagnosis not present

## 2019-02-12 ENCOUNTER — Encounter: Payer: Self-pay | Admitting: Family Medicine

## 2019-02-12 ENCOUNTER — Ambulatory Visit (INDEPENDENT_AMBULATORY_CARE_PROVIDER_SITE_OTHER): Payer: Medicaid Other | Admitting: Family Medicine

## 2019-02-12 ENCOUNTER — Other Ambulatory Visit: Payer: Self-pay

## 2019-02-12 VITALS — BP 132/82 | HR 72 | Wt 246.0 lb

## 2019-02-12 DIAGNOSIS — L732 Hidradenitis suppurativa: Secondary | ICD-10-CM | POA: Diagnosis not present

## 2019-02-12 DIAGNOSIS — M25569 Pain in unspecified knee: Secondary | ICD-10-CM | POA: Diagnosis not present

## 2019-02-12 MED ORDER — CLINDAMYCIN HCL 300 MG PO CAPS: 300.0000 mg | ORAL_CAPSULE | Freq: Two times a day (BID) | ORAL | 0 refills | Status: DC

## 2019-02-12 MED ORDER — TRAMADOL-ACETAMINOPHEN 37.5-325 MG PO TABS: 1.0000 | ORAL_TABLET | Freq: Three times a day (TID) | ORAL | 0 refills | Status: DC | PRN

## 2019-02-12 MED ORDER — DOXYCYCLINE HYCLATE 100 MG PO TABS: 100.0000 mg | ORAL_TABLET | Freq: Every day | ORAL | 0 refills | Status: DC

## 2019-02-12 MED ORDER — FLUCONAZOLE 150 MG PO TABS: 150.0000 mg | ORAL_TABLET | Freq: Every day | ORAL | 0 refills | Status: DC

## 2019-02-12 NOTE — Progress Notes (Signed)
Subjective:  Patient ID: [name]  DOB: 30-Jun-1965 MRN: [id number]  [name] is a 53 y.o. female with a PMH of trichomoniasis, hidradenitis suppurativa, GERD, varicose veins, and recurrent headaches here today for evaluation of vaginal discharge, hidradenitis flare, headaches, and concern for hemmorhoids.   HPI:  Vaginal Discharge: - She reports a thin, white vaginal discharge that started soon after she completed a course of metronidazole for trichomoniasis. She denies any odor, dysuria, dyspareunia, or bleeding. She reports itching that was relieved by single dose Monistat.  She is sexually active with one female partner and reports no concern for an STI. She is in a hurry today, and wishes to avoid a pelvic exam if possible.   Hidradenitis Suppurativa: - Her HS has been well controlled for the past two years, but she reports a several week history of axillary and pelvic boils. One of the boils in her right axilla recently ruptured, revealing a white stone, which she discarded. She has presented these stones for microscopy in the past and they have been found to be consistent with sebum. She reports that one of the pelvic boils has ruptured and currently has discharge. She denies any fevers. She has not had success with topical treatments in the past and is requesting a course of antibiotics.   Headaches: - She reports recurrent headaches, worse in the morning, reaching 10/10 severity. Headaches are global and not associated with any nausea, vomiting, photophobia or visual changes. She attributes these to mold exposure in her current living arrangement. She moved out of her apartment in April, with resolution of symptoms only to have them return when she moved back in a few weeks ago. She is currently in a lawsuit with her landlord to recuperate losses and medical costs associated with the mold. She has had limited relief with ibuprofen and acetaminophen, but reports that  Tramadol-Acetaminophen 37.5-325 has been effective in controlling her symptoms. She first received this at urgent care a few weeks ago and is requesting a refill today.   Concern for Hemorrhoids:  She states that she is concerned that she has hemorrhoids as she is having pain with wiping. She also expresses some concern that these may be related to or exacerbated by intercourse with her new husband. She denies any bleeding. Given the time constraints of today's visit and her hesitancy to have a rectal exam today, she agrees that this would be better evaluated at a future visit.  . Med Management: - She is requesting a refill of her pantoprazole.     Family hx: Her daughter also has HS, and is well-controled on adalimumab.   Smoking status reviewed: Yes. Still working on cutting back. Only smoking 1 Black and Mild ~4 days.     Objective:  BP 132/82   Pulse 72   Wt 246 lb (111.6 kg)   SpO2 98%   BMI 30.75 kg/m   Vitals and nursing note reviewed  General: NAD, pleasant Cardiac: RRR, normal heart sounds, no m/r/g Pulm: normal effort, CTAB Skin: 2 cm mass present in left axilla. Mass is firm and non-tender to palpation. No redness, warmth, or visible drainage. Significant scarring noted in axillae bilaterally.  Pelvic: Not performed, per patient request Rectum: Not performed, per patient request  Assessment & Plan:   Vaginal Discharge: History of antibiotic use followed by thin, white discharge is consistent with yeast infection. We will order single dose Diflucan. Instructed her to return for pelvic exam and further workup if symptoms  do not resolve.   Hidradenitis Suppurativa:  Axillary lesions consistent with HS. Given her past intolerance of doxycycline, will order a 2 week course of clindamycin twice daily. In the meantime, instructed her to take epsom salts baths and to keep the ruptured pelvic boil clean and open.   Headaches:  History consistent with tension headaches  related to mold exposure in living environment. Will refill tramadol-acetaminophen 37.5-325 today and evaluate further at future visit. Instructed her to seek same-day care if she develops worsening symptoms, nausea, vomiting, or visual changes.   Concern for Hemorrhoids: Cannot evaluate without physical exam. Encouraged her that epsom salts baths for HS are likely to also help with symptom control. Encouraged her to schedule a follow-up for further workup and physical exam.   GERD:  Well-controlled on pantoprazole. Will refill today.    Eliezer Mccoy Medical Student

## 2019-02-12 NOTE — Patient Instructions (Addendum)
There is a quick summary of the things that we talked about:  Possible yeast infection: Treat with 1 pill of fluconazole for now.  If her symptoms do not get better in the next several days to 1 week, please return to clinic for a formal pelvic exam will be can take samples make sure we have diagnosed you correctly.  Hidradenitis suppurativa: I have prescribed you a 2-week course of doxycycline.  If this does not improve your hidradenitis, please call and let us know.  We can prescribe a longer course of antibiotics over the phone if necessary.  Headaches: I do not think these headaches are dangerous.  For now, you can continue taking the tramadol with Tylenol.  Please make sure that you are not assessed the same day if you think your headache symptoms are getting worse or you experience vision changes, nausea, vomiting.  Possible hemorrhoids: Difficult to diagnose that a physical exam.  For now, I recommend sitz bath's to help for comfort.  I do think you should come back to clinic for a physical exam and further treatment recommendations.  The sitz bath's will be helpful for your hidradenitis as well. How to Take a [employer] A sitz bath is a warm water bath that may be used to care for your rectum, genital area, or the area between your rectum and genitals (perineum). For a sitz bath, the water only comes up to your hips and covers your buttocks. A sitz bath may done at home in a bathtub or with a portable sitz bath that fits over the toilet. Your health care provider may recommend a sitz bath to help:  Relieve pain and discomfort after delivering a baby.  Relieve pain and itching from hemorrhoids or anal fissures.  Relieve pain after certain surgeries.  Relax muscles that are sore or tight. How to take a sitz bath Take 3-4 sitz baths a day, or as many as told by your health care provider. Bathtub sitz bath To take a sitz bath in a bathtub: 1. Partially fill a bathtub with warm water. The  water should be deep enough to cover your hips and buttocks when you are sitting in the tub. 2. If your health care provider told you to put medicine in the water, follow his or her instructions. 3. Sit in the water. 4. Open the tub drain a little, and leave it open during your bath. 5. Turn on the warm water again, enough to replace the water that is draining out. Keep the water running throughout your bath. This helps keep the water at the right level and the right temperature. 6. Soak in the water for 15-20 minutes, or as long as told by your health care provider. 7. When you are done, be careful when you stand up. You may feel dizzy. 8. After the sitz bath, pat yourself dry. Do not rub your skin to dry it.  Over-the-toilet sitz bath To take a sitz bath with an over-the-toilet basin: 1. Follow the manufacturer's instructions. 2. Fill the basin with warm water. 3. If your health care provider told you to put medicine in the water, follow his or her instructions. 4. Sit on the seat. Make sure the water covers your buttocks and perineum. 5. Soak in the water for 15-20 minutes, or as long as told by your health care provider. 6. After the sitz bath, pat yourself dry. Do not rub your skin to dry it. 7. Clean and dry the basin between uses.  8. Discard the basin if it cracks, or according to the manufacturer's instructions. Contact a health care provider if:  Your symptoms get worse. Do not continue with sitz baths if your symptoms get worse.  You have new symptoms. If this happens, do not continue with sitz baths until you talk with your health care provider. Summary  A sitz bath is a warm water bath in which the water only comes up to your hips and covers your buttocks.  A sitz bath may help relieve itching, relieve pain, and relax muscles that are sore or tight in the lower part of your body, including your genital area.  Take 3-4 sitz baths a day, or as many as told by your health care  provider. Soak in the water for 15-20 minutes.  Do not continue with sitz baths if your symptoms get worse. This information is not intended to replace advice given to you by your health care provider. Make sure you discuss any questions you have with your health care provider. Document Released: 12/02/2003 Document Revised: 03/13/2017 Document Reviewed: 03/13/2017 Elsevier Patient Education  2020 [employer].

## 2019-02-13 DIAGNOSIS — M25569 Pain in unspecified knee: Secondary | ICD-10-CM | POA: Diagnosis not present

## 2019-02-14 ENCOUNTER — Telehealth: Payer: Self-pay | Admitting: Family Medicine

## 2019-02-14 ENCOUNTER — Other Ambulatory Visit: Payer: Self-pay | Admitting: Family Medicine

## 2019-02-14 DIAGNOSIS — M25569 Pain in unspecified knee: Secondary | ICD-10-CM | POA: Diagnosis not present

## 2019-02-14 NOTE — Telephone Encounter (Signed)
**  After Hours/ Emergency Line Call**  Received a call to report that [name] cannot sleep because she is having trouble with acid reflux.  Endorsing belching in burning in her throat.  Denying chest pain or shortness of breath or nausea.  Recommended that she continue trying the Tums she has at home, or suggested that she try Pepcid from over-the-counter.  Recommended that she also prop herself up with pillows rather than lying flat in order to decrease the symptoms.  She has an appointment tomorrow and recommended that she keep this. Will forward to PCP.  [place] Cherith Tewell, DO PGY-3, [facility] 02/14/2019 11:33 PM

## 2019-02-15 ENCOUNTER — Ambulatory Visit: Payer: Medicaid Other | Admitting: Family Medicine

## 2019-02-15 ENCOUNTER — Other Ambulatory Visit: Payer: Self-pay

## 2019-02-15 ENCOUNTER — Telehealth: Payer: Self-pay | Admitting: Family Medicine

## 2019-02-15 ENCOUNTER — Telehealth: Payer: Self-pay | Admitting: *Deleted

## 2019-02-15 DIAGNOSIS — M25569 Pain in unspecified knee: Secondary | ICD-10-CM | POA: Diagnosis not present

## 2019-02-15 NOTE — Telephone Encounter (Signed)
Patient's PPI was refilled today.  Thanks.

## 2019-02-15 NOTE — Telephone Encounter (Signed)
Pt has an open refill request pending. Please advise. Ottis Stain, CMA

## 2019-02-15 NOTE — Telephone Encounter (Signed)
Received fax that new script of #15 tablets needs a new PA because last PA was for #30 pills.  I will be happy to complete a new PA, but want to verify this is ok with Providers (may void #30 tablets that is good until April 2021).  Will wait to hear back from providers. Christen Bame, CMA

## 2019-02-15 NOTE — Telephone Encounter (Signed)
Yes, that please complete PA for #15 pills.  Matilde Haymaker, MD

## 2019-02-15 NOTE — Telephone Encounter (Signed)
Patient's tramadol prescription for 30 pills was refilled by me today, and we have called the pharmacy to confirm that she will only receive this prescription.  Therefore, a prior authorization is no longer needed.  Thanks.

## 2019-02-15 NOTE — Telephone Encounter (Signed)
Patient still needs her prescription for her acid reflux pills as she cannot sleep well without them.  Please send them to [facility] on [address].

## 2019-02-16 ENCOUNTER — Ambulatory Visit: Payer: Medicaid Other | Admitting: Family Medicine

## 2019-02-16 DIAGNOSIS — Z79891 Long term (current) use of opiate analgesic: Secondary | ICD-10-CM | POA: Diagnosis not present

## 2019-02-16 DIAGNOSIS — M25569 Pain in unspecified knee: Secondary | ICD-10-CM | POA: Diagnosis not present

## 2019-02-16 DIAGNOSIS — F3181 Bipolar II disorder: Secondary | ICD-10-CM | POA: Diagnosis not present

## 2019-02-17 DIAGNOSIS — M25569 Pain in unspecified knee: Secondary | ICD-10-CM | POA: Diagnosis not present

## 2019-02-18 DIAGNOSIS — M25569 Pain in unspecified knee: Secondary | ICD-10-CM | POA: Diagnosis not present

## 2019-02-19 DIAGNOSIS — M25569 Pain in unspecified knee: Secondary | ICD-10-CM | POA: Diagnosis not present

## 2019-02-20 DIAGNOSIS — M25569 Pain in unspecified knee: Secondary | ICD-10-CM | POA: Diagnosis not present

## 2019-02-21 DIAGNOSIS — M25569 Pain in unspecified knee: Secondary | ICD-10-CM | POA: Diagnosis not present

## 2019-02-22 DIAGNOSIS — M25569 Pain in unspecified knee: Secondary | ICD-10-CM | POA: Diagnosis not present

## 2019-02-23 DIAGNOSIS — M25569 Pain in unspecified knee: Secondary | ICD-10-CM | POA: Diagnosis not present

## 2019-02-24 DIAGNOSIS — M25569 Pain in unspecified knee: Secondary | ICD-10-CM | POA: Diagnosis not present

## 2019-02-25 DIAGNOSIS — M25569 Pain in unspecified knee: Secondary | ICD-10-CM | POA: Diagnosis not present

## 2019-02-26 ENCOUNTER — Telehealth: Payer: Self-pay | Admitting: Pharmacist

## 2019-02-26 DIAGNOSIS — J449 Chronic obstructive pulmonary disease, unspecified: Secondary | ICD-10-CM

## 2019-02-26 DIAGNOSIS — M25569 Pain in unspecified knee: Secondary | ICD-10-CM | POA: Diagnosis not present

## 2019-02-26 MED ORDER — VARENICLINE TARTRATE 1 MG PO TABS: 1.0000 mg | ORAL_TABLET | Freq: Two times a day (BID) | ORAL | 2 refills | Status: DC

## 2019-02-26 NOTE — Telephone Encounter (Signed)
Phone call to patient RE tobacco cessation and breathing.    - Patient stated she is "not doing very well" - Patient continues to reside in her [address] housing which has "MOLD" - Patient report limited use of "Black and Milds" but states she is only puffing a few times.  - She desires to continue use of varenicline at this time.  Refill provided.   Patient continues to use [facility] and is not routinely using albuterol at this time.  I reinforced continued abstinence from tobacco as a very important factor for her continued breathing.   Patient also noted poor sleep.   She states she has not received a refill from her doctor of Ambien.  She requested assistance with "something for sleep"   She specifically requested "seroquel" or "ambien".  I informed her that I would forward this request to her physician.   At this time, she does not have a follow-up schedule for her PCP.   Patient was interested and willing to receive additional follow-up in 1 week to reassess and support "change".  I will plan to call her next week.

## 2019-02-27 DIAGNOSIS — M25569 Pain in unspecified knee: Secondary | ICD-10-CM | POA: Diagnosis not present

## 2019-02-28 DIAGNOSIS — M25569 Pain in unspecified knee: Secondary | ICD-10-CM | POA: Diagnosis not present

## 2019-03-01 DIAGNOSIS — M25569 Pain in unspecified knee: Secondary | ICD-10-CM | POA: Diagnosis not present

## 2019-03-02 DIAGNOSIS — M25569 Pain in unspecified knee: Secondary | ICD-10-CM | POA: Diagnosis not present

## 2019-03-03 DIAGNOSIS — M25569 Pain in unspecified knee: Secondary | ICD-10-CM | POA: Diagnosis not present

## 2019-03-04 ENCOUNTER — Telehealth: Payer: Self-pay | Admitting: Pharmacist

## 2019-03-04 DIAGNOSIS — M25569 Pain in unspecified knee: Secondary | ICD-10-CM | POA: Diagnosis not present

## 2019-03-04 NOTE — Telephone Encounter (Signed)
Patient contact Re tobacco cessation.   Patient is still awaiting "marriage certificate" to allow her to move.  She remains stressed about her living situation.   She continues to report smoking 1 intermittent black and mild daily.   She reports continued interest in quitting completely.   Note: She stated the sleeping medication sent in for her recently cannot be dispensed until the pharmacy has a diagnosis for this medication Lorrin Mais).

## 2019-03-05 DIAGNOSIS — M25569 Pain in unspecified knee: Secondary | ICD-10-CM | POA: Diagnosis not present

## 2019-03-05 NOTE — Telephone Encounter (Signed)
I am not the physician who prescribes Ms. Brau her Ambien - Dr. Reece Levy is listed as the prescriber in the [facility].  Please let her know that she should call his office for this clarification instead.  Thanks.

## 2019-03-06 DIAGNOSIS — M25569 Pain in unspecified knee: Secondary | ICD-10-CM | POA: Diagnosis not present

## 2019-03-07 DIAGNOSIS — M25569 Pain in unspecified knee: Secondary | ICD-10-CM | POA: Diagnosis not present

## 2019-03-08 ENCOUNTER — Other Ambulatory Visit: Payer: Self-pay | Admitting: Family Medicine

## 2019-03-08 DIAGNOSIS — M25569 Pain in unspecified knee: Secondary | ICD-10-CM | POA: Diagnosis not present

## 2019-03-08 MED ORDER — TRAMADOL-ACETAMINOPHEN 37.5-325 MG PO TABS: 1.0000 | ORAL_TABLET | Freq: Three times a day (TID) | ORAL | 0 refills | Status: DC | PRN

## 2019-03-08 MED ORDER — TRAZODONE HCL 50 MG PO TABS: 50.0000 mg | ORAL_TABLET | Freq: Every evening | ORAL | 3 refills | Status: DC | PRN

## 2019-03-08 NOTE — Telephone Encounter (Signed)
I called Ms. [name] and let her know that her tramadol should have the indication listed now, which will be her chronic knee pain.  I also let her know that I won't be prescribing Ambien or Seroquel for sleep, but I did offer trazodone, which she accepted.  I sent a prescription for trazodone to her pharmacy.

## 2019-03-08 NOTE — Telephone Encounter (Signed)
Contacted pt and gave her the below information and she stated that Dr. Reece Levy is no longer prescribing her this medicaion (Ambien), she wanted to know if she could get [place] and if not asked about seroquel.  Also she said that the pharmacy needs the diagnosis for her pain medication.  THey need to know why she is taking it, she mentioned the headaches due to the mold in her apartment. [name], CMA

## 2019-03-09 DIAGNOSIS — M25569 Pain in unspecified knee: Secondary | ICD-10-CM | POA: Diagnosis not present

## 2019-03-10 DIAGNOSIS — M25569 Pain in unspecified knee: Secondary | ICD-10-CM | POA: Diagnosis not present

## 2019-03-11 DIAGNOSIS — M25569 Pain in unspecified knee: Secondary | ICD-10-CM | POA: Diagnosis not present

## 2019-03-12 DIAGNOSIS — M25569 Pain in unspecified knee: Secondary | ICD-10-CM | POA: Diagnosis not present

## 2019-03-13 DIAGNOSIS — M25569 Pain in unspecified knee: Secondary | ICD-10-CM | POA: Diagnosis not present

## 2019-03-14 DIAGNOSIS — M25569 Pain in unspecified knee: Secondary | ICD-10-CM | POA: Diagnosis not present

## 2019-03-15 DIAGNOSIS — M25569 Pain in unspecified knee: Secondary | ICD-10-CM | POA: Diagnosis not present

## 2019-03-16 DIAGNOSIS — M25569 Pain in unspecified knee: Secondary | ICD-10-CM | POA: Diagnosis not present

## 2019-03-17 DIAGNOSIS — M25569 Pain in unspecified knee: Secondary | ICD-10-CM | POA: Diagnosis not present

## 2019-03-18 DIAGNOSIS — M25569 Pain in unspecified knee: Secondary | ICD-10-CM | POA: Diagnosis not present

## 2019-03-19 DIAGNOSIS — M25569 Pain in unspecified knee: Secondary | ICD-10-CM | POA: Diagnosis not present

## 2019-03-20 DIAGNOSIS — M25569 Pain in unspecified knee: Secondary | ICD-10-CM | POA: Diagnosis not present

## 2019-03-21 DIAGNOSIS — M25569 Pain in unspecified knee: Secondary | ICD-10-CM | POA: Diagnosis not present

## 2019-03-22 DIAGNOSIS — M25569 Pain in unspecified knee: Secondary | ICD-10-CM | POA: Diagnosis not present

## 2019-03-23 ENCOUNTER — Telehealth: Payer: Self-pay | Admitting: Pharmacist

## 2019-03-23 DIAGNOSIS — M25569 Pain in unspecified knee: Secondary | ICD-10-CM | POA: Diagnosis not present

## 2019-03-23 NOTE — Telephone Encounter (Signed)
Noted and agree. 

## 2019-03-23 NOTE — Telephone Encounter (Signed)
Contacted patient RE tobacco cessation attempt.   At this time, she reports continued use of 4 "Black and Milds" per week.  She states the stress of moving back into her apartment has caused her continuous headaches.  She remains stressed at this time.   She was encouraged to continue taking her Chantix (varenicline) at this time - same dose 1mg  BID with food.   I encouraged her to continue working on abstinence and smoking less often.  We agreed to talk again by phone in ~ 3 weeks.

## 2019-03-24 DIAGNOSIS — M25569 Pain in unspecified knee: Secondary | ICD-10-CM | POA: Diagnosis not present

## 2019-04-01 ENCOUNTER — Telehealth: Payer: Self-pay | Admitting: Family Medicine

## 2019-04-01 ENCOUNTER — Telehealth: Payer: Self-pay | Admitting: *Deleted

## 2019-04-01 NOTE — Telephone Encounter (Signed)
Pt is calling and would like to speak with Dr. Raymondo Band concerning her Chantix and issues with her breathing.   Please call pt back to discuss at [phone]

## 2019-04-01 NOTE — Telephone Encounter (Signed)
Pt calls triage line very SOB asking to to speak with Dr. Raymondo Band.  Pt is unable to speak more than 2-3 words without being out of breath.  When I advised that she needed to be seen @ [facility] she wonders why she cant be seen here.  When I explained that we were not seeing people with covid symptoms, she becomes angry that I said she had covid symptoms.  Further explained that even if we could see her we have no appts for today.   At this point her breathing is normal and she is not struggling to catch breath anymore.  She says its because she just used her inhaler.  She then request that Dr. Raymondo Band call her about the chantix, but when I asked her to give me more detail she started talking about the "black dust" in her house.   Unsure of what pt is needing to speak with Dr. Raymondo Band about but is adamant to do so.  To Dr. Raymondo Band. Jone Baseman, CMA

## 2019-04-01 NOTE — Telephone Encounter (Signed)
Patient reports difficulty breathing this morning.  States is has improved with use of Dulera. Reports that her breathing is worsened due to the "mold in her apartment".   Reports complete cessation from smoking "black and mild".  She states she feels like she has completely quit smoking.  Encouraged continued abstinence.  Advised to stop all Chantix and patches at this time (as she has been OFF therapy for awhile).   Not using much albuterol via inhaler recently.    No change in therapy at the end of call.  Plan to follow- up in 3-5 days.

## 2019-04-01 NOTE — Telephone Encounter (Signed)
See next phone note. Jone Baseman, CMA

## 2019-04-03 DIAGNOSIS — M25569 Pain in unspecified knee: Secondary | ICD-10-CM | POA: Diagnosis not present

## 2019-04-04 DIAGNOSIS — M25569 Pain in unspecified knee: Secondary | ICD-10-CM | POA: Diagnosis not present

## 2019-04-05 DIAGNOSIS — M25569 Pain in unspecified knee: Secondary | ICD-10-CM | POA: Diagnosis not present

## 2019-04-07 ENCOUNTER — Telehealth (INDEPENDENT_AMBULATORY_CARE_PROVIDER_SITE_OTHER): Payer: Medicaid Other | Admitting: Family Medicine

## 2019-04-07 DIAGNOSIS — J449 Chronic obstructive pulmonary disease, unspecified: Secondary | ICD-10-CM

## 2019-04-07 DIAGNOSIS — J392 Other diseases of pharynx: Secondary | ICD-10-CM | POA: Insufficient documentation

## 2019-04-07 DIAGNOSIS — R0989 Other specified symptoms and signs involving the circulatory and respiratory systems: Secondary | ICD-10-CM

## 2019-04-07 DIAGNOSIS — Z77120 Contact with and (suspected) exposure to mold (toxic): Secondary | ICD-10-CM

## 2019-04-07 MED ORDER — LORATADINE 10 MG PO TABS: 10.0000 mg | ORAL_TABLET | Freq: Every day | ORAL | 0 refills | Status: DC

## 2019-04-07 NOTE — Progress Notes (Signed)
[facility] Telemedicine Visit  Patient consented to have virtual visit. Method of visit: Video  Encounter participants: Patient: [name] - located at home Provider: Unknown Jim - located at home Others (if applicable): none  Chief Complaint: Scratchy throat and mold exposure  HPI: [name] is a 54 y.o. female who presents to discuss the following:  Has COPD and has mold in her apartment.  She had someone come and start to suck some mold out of her apartment.  This AM, she started to have an itchy, irritated throat.  No cough.  No SOB or increased sputum production.  Has not had fevers.  Not tried anything.  Has had mold in her apartment for at least 2 months.  No known COVID-19 contacts or other sick contacts.     ROS: per HPI  Pertinent PMHx: Asthma/COPD overlap syndrome  Exam:  Respiratory: Speaking in complete sentences, no evidence of respiratory distress, sitting up  Assessment/Plan:  Irritated throat Likely allergic in nature, patient does have known mold exposure.  Reassured that she is not having any fevers or chills respiratory complaints.  No shortness of breath.  No evidence of COPD exacerbation with shortness of breath, cough, or increased sputum production.  She does not have any known sick contacts, but given current COVID-19 pandemic, do not think that this can be obviously excluded.  Patient does have an allergy listed to cetirizine, discussed with her that given her complaint of dizziness with this medication, could try another medication called Claritin, which is in the same class, but may not cause the dizziness.  Patient was open to this.  Rx sent.  Patient reports that she has Flonase at home, advised her to use this once a day.  Also advised to use of warm tea or warm water with honey to soothe her throat.  While it is unlikely that she has COVID-19 given lack of exposure and only mild symptoms, will have her  quarantine for 2 days to see if her symptoms resolve.  Advised if no improvement in the next 2 days, to call the office for follow-up appointment.  At that time, would recommend that patient be tested for COVID-19.  She voiced understanding of this, states that she does not work and that she would be able to quarantine at home.  Also advised that if she has someone else in her household, they should wear a mask when around each other.  ED precautions discussed including worsening symptoms, shortness of breath at rest or on exertion, fever, chest pain.    Time spent during visit with patient: 13 minutes

## 2019-04-07 NOTE — Assessment & Plan Note (Signed)
Likely allergic in nature, patient does have known mold exposure.  Reassured that she is not having any fevers or chills respiratory complaints.  No shortness of breath.  No evidence of COPD exacerbation with shortness of breath, cough, or increased sputum production.  She does not have any known sick contacts, but given current COVID-19 pandemic, do not think that this can be obviously excluded.  Patient does have an allergy listed to cetirizine, discussed with her that given her complaint of dizziness with this medication, could try another medication called Claritin, which is in the same class, but may not cause the dizziness.  Patient was open to this.  Rx sent.  Patient reports that she has Flonase at home, advised her to use this once a day.  Also advised to use of warm tea or warm water with honey to soothe her throat.  While it is unlikely that she has COVID-19 given lack of exposure and only mild symptoms, will have her quarantine for 2 days to see if her symptoms resolve.  Advised if no improvement in the next 2 days, to call the office for follow-up appointment.  At that time, would recommend that patient be tested for COVID-19.  She voiced understanding of this, states that she does not work and that she would be able to quarantine at home.  Also advised that if she has someone else in her household, they should wear a mask when around each other.  ED precautions discussed including worsening symptoms, shortness of breath at rest or on exertion, fever, chest pain.

## 2019-04-08 ENCOUNTER — Other Ambulatory Visit: Payer: Self-pay

## 2019-04-08 ENCOUNTER — Ambulatory Visit (HOSPITAL_COMMUNITY)
Admission: EM | Admit: 2019-04-08 | Discharge: 2019-04-08 | Disposition: A | Discharge status: Home / Self Care | Payer: Medicaid Other | Attending: Family Medicine | Admitting: Family Medicine

## 2019-04-08 ENCOUNTER — Encounter (HOSPITAL_COMMUNITY): Payer: Self-pay

## 2019-04-08 DIAGNOSIS — K219 Gastro-esophageal reflux disease without esophagitis: Secondary | ICD-10-CM | POA: Diagnosis not present

## 2019-04-08 DIAGNOSIS — Z87891 Personal history of nicotine dependence: Secondary | ICD-10-CM | POA: Insufficient documentation

## 2019-04-08 DIAGNOSIS — F419 Anxiety disorder, unspecified: Secondary | ICD-10-CM | POA: Diagnosis not present

## 2019-04-08 DIAGNOSIS — F319 Bipolar disorder, unspecified: Secondary | ICD-10-CM | POA: Insufficient documentation

## 2019-04-08 DIAGNOSIS — M545 Low back pain: Secondary | ICD-10-CM | POA: Insufficient documentation

## 2019-04-08 DIAGNOSIS — M25511 Pain in right shoulder: Secondary | ICD-10-CM | POA: Diagnosis not present

## 2019-04-08 DIAGNOSIS — G8929 Other chronic pain: Secondary | ICD-10-CM | POA: Insufficient documentation

## 2019-04-08 DIAGNOSIS — Z20818 Contact with and (suspected) exposure to other bacterial communicable diseases: Secondary | ICD-10-CM | POA: Diagnosis not present

## 2019-04-08 DIAGNOSIS — G629 Polyneuropathy, unspecified: Secondary | ICD-10-CM | POA: Insufficient documentation

## 2019-04-08 DIAGNOSIS — J449 Chronic obstructive pulmonary disease, unspecified: Secondary | ICD-10-CM | POA: Insufficient documentation

## 2019-04-08 DIAGNOSIS — J029 Acute pharyngitis, unspecified: Secondary | ICD-10-CM | POA: Diagnosis not present

## 2019-04-08 DIAGNOSIS — J020 Streptococcal pharyngitis: Secondary | ICD-10-CM | POA: Diagnosis not present

## 2019-04-08 DIAGNOSIS — M25562 Pain in left knee: Secondary | ICD-10-CM | POA: Insufficient documentation

## 2019-04-08 DIAGNOSIS — M25561 Pain in right knee: Secondary | ICD-10-CM | POA: Insufficient documentation

## 2019-04-08 DIAGNOSIS — Z720 Tobacco use: Secondary | ICD-10-CM

## 2019-04-08 DIAGNOSIS — Z20822 Contact with and (suspected) exposure to covid-19: Secondary | ICD-10-CM | POA: Insufficient documentation

## 2019-04-08 DIAGNOSIS — Z79899 Other long term (current) drug therapy: Secondary | ICD-10-CM | POA: Diagnosis not present

## 2019-04-08 DIAGNOSIS — M25569 Pain in unspecified knee: Secondary | ICD-10-CM | POA: Diagnosis not present

## 2019-04-08 DIAGNOSIS — G4700 Insomnia, unspecified: Secondary | ICD-10-CM | POA: Diagnosis not present

## 2019-04-08 LAB — POCT RAPID STREP A: Streptococcus, Group A Screen (Direct): NEGATIVE

## 2019-04-08 MED ORDER — DULERA 200-5 MCG/ACT IN AERO: 2.0000 | INHALATION_SPRAY | Freq: Two times a day (BID) | RESPIRATORY_TRACT | 0 refills | Status: DC

## 2019-04-08 MED ORDER — AMOXICILLIN 875 MG PO TABS: 875.0000 mg | ORAL_TABLET | Freq: Two times a day (BID) | ORAL | 0 refills | Status: AC

## 2019-04-08 MED ORDER — TRAMADOL HCL 50 MG PO TABS: 50.0000 mg | ORAL_TABLET | Freq: Four times a day (QID) | ORAL | 0 refills | Status: DC | PRN

## 2019-04-08 NOTE — ED Triage Notes (Signed)
Pt. States her sore throat started 2 days ago, states she has taken NO meds to relieve her discomfort.

## 2019-04-08 NOTE — Discharge Instructions (Addendum)
Take the amoxil 2 x a day Take the pain medicine as needed I have refilled your inhaler Check My Chart for your test results You should quarantine at home until results available

## 2019-04-08 NOTE — ED Provider Notes (Signed)
[facility]    CSN: [id number] Arrival date & time: 04/08/19  1453      History   Chief Complaint Chief Complaint  Patient presents with  . Sore Throat    HPI [name] is a 54 y.o. female.   HPI  Patient states her daughter has strep throat.  She has had a sore throat for 2 days.  She states it is painful to swallow.  No headache.  No fever or chills.  No body aches.  No shortness of breath.  No Covid exposure.  I asked whether she has tonsils and she states "I don't know"  Past Medical History:  Diagnosis Date  . Anxiety   . Arthritis   . Bipolar 1 disorder (HCC)   . 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 Depression   . ECZEMA, ATOPIC DERMATITIS 08/25/2007   Qualifier: Diagnosis of  By: Seleta Rhymes MD, Loraine Leriche    . GERD (gastroesophageal reflux disease)   . LOW BACK PAIN, CHRONIC 06/30/2006   Qualifier: Diagnosis of  By: Seleta Rhymes MD, Loraine Leriche    . OVARIAN CYST 08/12/2007   Qualifier: Diagnosis of  By: Seleta Rhymes MD, Loraine Leriche    . Panic attacks   . 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 TOBACCO ABUSE 04/13/2008  . VARICOSE VEINS, LOWER EXTREMITIES 06/30/2006   Qualifier: Diagnosis of  By: Seleta Rhymes MD, Mark      Patient Active Problem List   Diagnosis Date Noted  . Irritated throat 04/07/2019  . Vaginal bleeding 11/04/2018  . Vaginal irritation 10/06/2018  . 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 Right knee pain 09/08/2018  . Suspected exposure to mold 09/08/2018  . Nasal congestion 08/13/2018  . Wheezing 08/13/2018  . Congestion of nasal sinus 08/03/2018  . Vasomotor symptoms due to menopause 04/23/2018  . Left knee pain 03/10/2018  . Loss of balance 08/08/2014  . Peripheral neuropathy 08/08/2014  . Acute periapical abscess 05/19/2014  . Hidradenitis suppurativa 12/07/2013  . Mouth pain 05/18/2012  . GERD (gastroesophageal reflux disease) 04/08/2012  . Headache  04/08/2012  . Osteoarthritis of knee 03/05/2012  . Arthritis of knee, degenerative 03/05/2012  . 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 SHOULDER PAIN, RIGHT 04/28/2009  . ELBOW PAIN, RIGHT 10/03/2008  . TOBACCO ABUSE 04/13/2008  . KNEE PAIN, RIGHT, CHRONIC 04/01/2008  . IRREGULAR MENSES 11/03/2007  . ANXIETY STATE NOS 02/10/2007  . DISORDER, BIPOLAR NOS 10/29/2006  . VARICOSE VEINS, LOWER EXTREMITIES 06/30/2006  . LOW BACK PAIN, CHRONIC 06/30/2006  . INSOMNIA 06/30/2006    Past Surgical History:  Procedure Laterality Date  . KNEE CARTILAGE SURGERY  2013   right     OB History   No obstetric history on file.      Home Medications    Prior to Admission medications   Medication Sig Start Date End Date Taking? Authorizing Provider  ALPRAZolam Prudy Feeler) 1 MG tablet Take 1 mg by mouth 4 (four) times daily as needed for anxiety. For anxiety    [provider]  amoxicillin (AMOXIL) 875 MG tablet Take 1 tablet (875 mg total) by mouth 2 (two) times daily for 10 days. 04/08/19 04/18/19  Eustace Moore, MD  cyclobenzaprine (FLEXERIL) 10 MG tablet TAKE 1 TABLET BY MOUTH TWICE A DAY AS NEEDED FOR MUSCLE SPASM 01/07/19   Lennox Solders, MD  divalproex (DEPAKOTE) 250 MG DR tablet Take 250 mg by  mouth 3 (three) times daily.     [provider]  fluconazole (DIFLUCAN) 150 MG tablet Take 1 tablet (150 mg total) by mouth daily. 02/12/19   Matilde Haymaker, MD  Ibuprofen 200 MG CAPS Take 400-600 capsules by mouth 3 (three) times daily.    [provider]  loratadine (CLARITIN) 10 MG tablet Take 1 tablet (10 mg total) by mouth daily. 04/07/19   Meccariello, Bernita Raisin, DO  mometasone-formoterol (DULERA) 200-5 MCG/ACT AERO Inhale 2 puffs into the lungs 2 (two) times daily. 04/08/19   Raylene Everts, MD  pantoprazole (PROTONIX) 40 MG tablet TAKE 1 TABLET(40 MG) BY MOUTH DAILY 02/15/19   Kathrene Alu, MD  traMADol (ULTRAM) 50 MG tablet Take 1 tablet (50 mg total) by mouth every 6  (six) hours as needed. 04/08/19   Raylene Everts, MD  traMADol-acetaminophen (ULTRACET) 37.5-325 MG tablet Take 1 tablet by mouth every 8 (eight) hours as needed. 03/08/19   Kathrene Alu, MD  traZODone (DESYREL) 50 MG tablet Take 1 tablet (50 mg total) by mouth at bedtime as needed for sleep. 03/08/19   Kathrene Alu, MD  varenicline (CHANTIX) 1 MG tablet Take 1 tablet (1 mg total) by mouth 2 (two) times daily. 02/26/19   Zenia Resides, MD  albuterol (VENTOLIN HFA) 108 (90 Base) MCG/ACT inhaler Inhale 2 puffs into the lungs every 4 (four) hours as needed for wheezing or shortness of breath. Patient not taking: Reported on 01/07/2019 09/07/18 04/08/19  Everrett Coombe, MD    Family History Family History  Problem Relation Age of Onset  . Hypertension Mother   . Diabetes Mother   . Diabetes Father   . Hypertension Father     Social History Social History   Tobacco Use  . Smoking status: Former Smoker    Packs/day: 0.50    Years: 36.00    Pack years: 18.00    Types: Cigarettes    Start date: 03/25/1981    Quit date: 08/29/2018    Years since quitting: 0.6  . Smokeless tobacco: Never Used  Substance Use Topics  . Alcohol use: Yes  . Drug use: Yes    Types: Marijuana     Allergies   Cetirizine & related   Review of Systems Review of Systems  Constitutional: Negative for chills and fever.  HENT: Positive for sore throat and trouble swallowing.   Gastrointestinal: Negative for nausea and vomiting.  Musculoskeletal: Negative for myalgias.  Neurological: Negative for headaches.     Physical Exam Triage Vital Signs ED Triage Vitals  Enc Vitals Group     BP 04/08/19 1559 (!) 184/87     Pulse Rate 04/08/19 1559 (!) 58     Resp 04/08/19 1559 18     Temp 04/08/19 1559 98.5 F (36.9 C)     Temp Source 04/08/19 1559 Oral     SpO2 04/08/19 1559 98 %     Weight 04/08/19 1557 259 lb 3.2 oz (117.6 kg)     Height --      Head Circumference --      Peak Flow --       Pain Score 04/08/19 1557 8     Pain Loc --      Pain Edu? --      Excl. in [place]? --    No data found.  Updated Vital Signs BP (!) 184/87 (BP Location: Right Arm)   Pulse (!) 58   Temp 98.5 F (36.9 C) (Oral)  Resp 18   Wt 117.6 kg   SpO2 98%   BMI 32.40 kg/m      Physical Exam Constitutional:      General: She is not in acute distress.    Appearance: She is well-developed. She is obese.  HENT:     Head: Normocephalic and atraumatic.     Mouth/Throat:     Pharynx: Posterior oropharyngeal erythema present.     Tonsils: No tonsillar exudate. 0 on the right. 0 on the left.     Comments: Posterior pharynx is erythematous.  No exudate Eyes:     Conjunctiva/sclera: Conjunctivae normal.     Pupils: Pupils are equal, round, and reactive to light.  Cardiovascular:     Rate and Rhythm: Normal rate and regular rhythm.  Pulmonary:     Effort: Pulmonary effort is normal. No respiratory distress.     Breath sounds: Normal breath sounds.  Abdominal:     General: There is no distension.     Palpations: Abdomen is soft.  Musculoskeletal:        General: Normal range of motion.     Cervical back: Normal range of motion.  Lymphadenopathy:     Cervical: Cervical adenopathy present.  Skin:    General: Skin is warm and dry.  Neurological:     General: No focal deficit present.     Mental Status: She is alert.  Psychiatric:        Behavior: Behavior normal.      UC Treatments / Results  Labs (all labs ordered are listed, but only abnormal results are displayed) Labs Reviewed  NOVEL CORONAVIRUS, NAA (HOSP ORDER, SEND-OUT TO REF LAB; TAT 18-24 HRS)  CULTURE, GROUP A STREP [facility])  POCT RAPID STREP A    EKG   Radiology No results found.  Procedures Procedures (including critical care time)  Medications Ordered in UC Medications - No data to display  Initial Impression / Assessment and Plan / UC Course  I have reviewed the triage vital signs and the nursing  notes.  Pertinent labs & imaging results that were available during my care of the patient were reviewed by me and considered in my medical decision making (see chart for details).     Rapid strep is negative.  Strep culture sent.  Normal coronavirus is sent.  Patient is told to check my chart for results Final Clinical Impressions(s) / UC Diagnoses   Final diagnoses:  Strep throat exposure  Streptococcal sore throat     Discharge Instructions     Take the amoxil 2 x a day Take the pain medicine as needed I have refilled your inhaler Check My Chart for your test results You should quarantine at home until results available   ED Prescriptions    Medication Sig Dispense Auth. Provider   mometasone-formoterol (DULERA) 200-5 MCG/ACT AERO Inhale 2 puffs into the lungs 2 (two) times daily. 8.8 g Eustace Moore, MD   amoxicillin (AMOXIL) 875 MG tablet Take 1 tablet (875 mg total) by mouth 2 (two) times daily for 10 days. 20 tablet Eustace Moore, MD   traMADol (ULTRAM) 50 MG tablet Take 1 tablet (50 mg total) by mouth every 6 (six) hours as needed. 10 tablet Eustace Moore, MD     I have reviewed the PDMP during this encounter.   Eustace Moore, MD 04/08/19 1946

## 2019-04-09 DIAGNOSIS — M25569 Pain in unspecified knee: Secondary | ICD-10-CM | POA: Diagnosis not present

## 2019-04-10 LAB — NOVEL CORONAVIRUS, NAA (HOSP ORDER, SEND-OUT TO REF LAB; TAT 18-24 HRS): SARS-CoV-2, NAA: NOT DETECTED

## 2019-04-11 LAB — CULTURE, GROUP A STREP (THRC)

## 2019-04-12 DIAGNOSIS — M25569 Pain in unspecified knee: Secondary | ICD-10-CM | POA: Diagnosis not present

## 2019-04-13 DIAGNOSIS — M25569 Pain in unspecified knee: Secondary | ICD-10-CM | POA: Diagnosis not present

## 2019-04-14 ENCOUNTER — Other Ambulatory Visit: Payer: Self-pay

## 2019-04-14 DIAGNOSIS — M25569 Pain in unspecified knee: Secondary | ICD-10-CM | POA: Diagnosis not present

## 2019-04-14 DIAGNOSIS — J449 Chronic obstructive pulmonary disease, unspecified: Secondary | ICD-10-CM

## 2019-04-14 MED ORDER — VARENICLINE TARTRATE 1 MG PO TABS: 1.0000 mg | ORAL_TABLET | Freq: Two times a day (BID) | ORAL | 2 refills | Status: DC

## 2019-04-15 ENCOUNTER — Ambulatory Visit (INDEPENDENT_AMBULATORY_CARE_PROVIDER_SITE_OTHER): Payer: Medicaid Other | Admitting: Family Medicine

## 2019-04-15 ENCOUNTER — Encounter: Payer: Self-pay | Admitting: Family Medicine

## 2019-04-15 ENCOUNTER — Other Ambulatory Visit: Payer: Self-pay

## 2019-04-15 VITALS — BP 146/86 | HR 72 | Wt 258.6 lb

## 2019-04-15 DIAGNOSIS — M25569 Pain in unspecified knee: Secondary | ICD-10-CM | POA: Diagnosis not present

## 2019-04-15 DIAGNOSIS — M7541 Impingement syndrome of right shoulder: Secondary | ICD-10-CM | POA: Diagnosis not present

## 2019-04-15 MED ORDER — TRAMADOL-ACETAMINOPHEN 37.5-325 MG PO TABS: 1.0000 | ORAL_TABLET | Freq: Four times a day (QID) | ORAL | 0 refills | Status: DC | PRN

## 2019-04-15 MED ORDER — CYCLOBENZAPRINE HCL 5 MG PO TABS: 5.0000 mg | ORAL_TABLET | Freq: Three times a day (TID) | ORAL | 1 refills | Status: DC | PRN

## 2019-04-15 NOTE — Progress Notes (Signed)
   Subjective:    [name] - 54 y.o. female MRN [id number]  Date of birth: June 30, 1965  CC:  [name] is here for R shoulder pain.  HPI: Started about a week ago Has not had this type of pain before Holding anything in her R hand or letting her arm hang down exacerbates pain Holding arm up and using a heating pad seems to help No injury to the shoulder that she knows of. Larey Seat about three weeks ago and is unsure if her fall could have contributed Is trying voltaren gel and biofreeze without success Was taking tramadol which is minimally helpful  Health Maintenance:  Health Maintenance Due  Topic Date Due  . HIV Screening  12/26/1980  . TETANUS/TDAP  12/26/1984  . PAP SMEAR-Modifier  07/26/2010  . MAMMOGRAM  12/27/2015  . COLONOSCOPY  12/27/2015    -  reports that she quit smoking about 7 months ago. Her smoking use included cigarettes. She started smoking about 38 years ago. She has a 18.00 pack-year smoking history. She has never used smokeless tobacco. - Review of Systems: Per HPI. - Past Medical History: Patient Active Problem List   Diagnosis Date Noted  . Impingement syndrome of right shoulder 04/16/2019  . Irritated throat 04/07/2019  . Vaginal bleeding 11/04/2018  . Vaginal irritation 10/06/2018  . 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 Right knee pain 09/08/2018  . Suspected exposure to mold 09/08/2018  . Nasal congestion 08/13/2018  . Wheezing 08/13/2018  . Congestion of nasal sinus 08/03/2018  . Vasomotor symptoms due to menopause 04/23/2018  . Left knee pain 03/10/2018  . Loss of balance 08/08/2014  . Peripheral neuropathy 08/08/2014  . Acute periapical abscess 05/19/2014  . Hidradenitis suppurativa 12/07/2013  . Mouth pain 05/18/2012  . GERD (gastroesophageal reflux disease) 04/08/2012  . Headache 04/08/2012  . Osteoarthritis of knee 03/05/2012  . Arthritis of knee, degenerative 03/05/2012  . 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  SHOULDER PAIN, RIGHT 04/28/2009  . ELBOW PAIN, RIGHT 10/03/2008  . TOBACCO ABUSE 04/13/2008  . KNEE PAIN, RIGHT, CHRONIC 04/01/2008  . IRREGULAR MENSES 11/03/2007  . ANXIETY STATE NOS 02/10/2007  . DISORDER, BIPOLAR NOS 10/29/2006  . VARICOSE VEINS, LOWER EXTREMITIES 06/30/2006  . LOW BACK PAIN, CHRONIC 06/30/2006  . INSOMNIA 06/30/2006   - Medications: reviewed and updated   Objective:   Physical Exam BP (!) 146/86   Pulse 72   Wt 258 lb 9.6 oz (117.3 kg)   SpO2 96%   BMI 32.32 kg/m  Gen: NAD, alert, cooperative with exam, Right shoulder: No visual abnormality, appears symmetrical.  Tenderness to palpation along the anterior, lateral, and posterior aspect of the right shoulder around the acromion and biceps tendon.  Active and passive ROM, strength are inhibited by pain.  Empty can positive, Hawkins positive.  Neurovascularly intact.       Assessment & Plan:   Impingement syndrome of right shoulder Unable to obtain good exam due to patient's pain.  Could be impingement syndrome or rotator cuff injury.  Lack of traumatic history or sudden onset suggests unlikely to be rotator cuff tear.  Will prescribe Flexeril and Ultracet for temporary pain relief and patient was given mobility exercises of the shoulder and encouraged to do these daily.  If pain persists, can consider subacromial injection.    Lezlie Octave, M.D. 04/16/2019, 7:29 AM PGY-3, [facility]

## 2019-04-16 DIAGNOSIS — M7541 Impingement syndrome of right shoulder: Secondary | ICD-10-CM

## 2019-04-16 HISTORY — DX: Impingement syndrome of right shoulder: M75.41

## 2019-04-16 NOTE — Assessment & Plan Note (Signed)
Unable to obtain good exam due to patient's pain.  Could be impingement syndrome or rotator cuff injury.  Lack of traumatic history or sudden onset suggests unlikely to be rotator cuff tear.  Will prescribe Flexeril and Ultracet for temporary pain relief and patient was given mobility exercises of the shoulder and encouraged to do these daily.  If pain persists, can consider subacromial injection.

## 2019-04-17 DIAGNOSIS — M25569 Pain in unspecified knee: Secondary | ICD-10-CM | POA: Diagnosis not present

## 2019-04-18 DIAGNOSIS — M25569 Pain in unspecified knee: Secondary | ICD-10-CM | POA: Diagnosis not present

## 2019-04-19 ENCOUNTER — Telehealth: Payer: Self-pay | Admitting: Pharmacist

## 2019-04-19 DIAGNOSIS — M25569 Pain in unspecified knee: Secondary | ICD-10-CM | POA: Diagnosis not present

## 2019-04-19 NOTE — Telephone Encounter (Signed)
-----   Message from Kathrin Ruddy, [facility] sent at 03/23/2019 12:32 PM EST ----- Regarding: Tobacco Cessation

## 2019-04-19 NOTE — Telephone Encounter (Signed)
Patient contacted RE tobacco cessation.  She states she is doing well.  After smoking an entire pack due to stress, she has now completely quit for 1 week after restarting Chantix (varenicline).    Encouraged continued abstinence.  I plan follow-up in 1 week.

## 2019-04-21 DIAGNOSIS — M25569 Pain in unspecified knee: Secondary | ICD-10-CM | POA: Diagnosis not present

## 2019-04-25 DIAGNOSIS — M25569 Pain in unspecified knee: Secondary | ICD-10-CM | POA: Diagnosis not present

## 2019-04-26 DIAGNOSIS — M25569 Pain in unspecified knee: Secondary | ICD-10-CM | POA: Diagnosis not present

## 2019-04-28 ENCOUNTER — Telehealth: Payer: Self-pay | Admitting: Pharmacist

## 2019-04-28 DIAGNOSIS — M25569 Pain in unspecified knee: Secondary | ICD-10-CM | POA: Diagnosis not present

## 2019-04-28 NOTE — Telephone Encounter (Signed)
Contacted patient RE tobacco cessation attempt.  Patient states she continues to smoke 3 black and mild per week.   She remains "stressed" at this time about her living situation.  She continues to look for an opportunity (housing) to move out of her current living situation (moldy apartment).   She reports continued use of varenicline at this time.  She denies any side effects from varenicline.   Patient requested continued phone support and states that she anticipates quitting completely in the new several weeks.  Plan for 2 week phone follow-up.

## 2019-04-30 DIAGNOSIS — M25569 Pain in unspecified knee: Secondary | ICD-10-CM | POA: Diagnosis not present

## 2019-05-03 DIAGNOSIS — M25569 Pain in unspecified knee: Secondary | ICD-10-CM | POA: Diagnosis not present

## 2019-05-04 DIAGNOSIS — M25569 Pain in unspecified knee: Secondary | ICD-10-CM | POA: Diagnosis not present

## 2019-05-06 DIAGNOSIS — M25569 Pain in unspecified knee: Secondary | ICD-10-CM | POA: Diagnosis not present

## 2019-05-08 DIAGNOSIS — M25569 Pain in unspecified knee: Secondary | ICD-10-CM | POA: Diagnosis not present

## 2019-05-10 DIAGNOSIS — M25569 Pain in unspecified knee: Secondary | ICD-10-CM | POA: Diagnosis not present

## 2019-05-11 ENCOUNTER — Ambulatory Visit (INDEPENDENT_AMBULATORY_CARE_PROVIDER_SITE_OTHER): Payer: Medicaid Other | Admitting: Family Medicine

## 2019-05-11 ENCOUNTER — Other Ambulatory Visit: Payer: Self-pay

## 2019-05-11 ENCOUNTER — Encounter: Payer: Self-pay | Admitting: Family Medicine

## 2019-05-11 VITALS — BP 118/78 | HR 94 | Wt 262.0 lb

## 2019-05-11 DIAGNOSIS — M25569 Pain in unspecified knee: Secondary | ICD-10-CM | POA: Diagnosis not present

## 2019-05-11 DIAGNOSIS — M170 Bilateral primary osteoarthritis of knee: Secondary | ICD-10-CM

## 2019-05-11 MED ORDER — METHYLPREDNISOLONE ACETATE 80 MG/ML IJ SUSP
80.0000 mg | Freq: Once | INTRAMUSCULAR | Status: AC
  Administered 2019-05-11: 10:00:00 80 mg via INTRA_ARTICULAR

## 2019-05-11 MED ORDER — TRAMADOL-ACETAMINOPHEN 37.5-325 MG PO TABS: 1.0000 | ORAL_TABLET | Freq: Four times a day (QID) | ORAL | 0 refills | Status: DC | PRN

## 2019-05-11 MED ORDER — CYCLOBENZAPRINE HCL 5 MG PO TABS: 5.0000 mg | ORAL_TABLET | Freq: Three times a day (TID) | ORAL | 1 refills | Status: DC | PRN

## 2019-05-11 NOTE — Progress Notes (Signed)
   CHIEF COMPLAINT / HPI:  Bilateral knee pain Chronic, has been diagnosed with osteoarthritis in the past Worse after the end of the day, uses heat, Ultracet, Flexeril for pain relief Has 2 hold onto the wall sometimes to support herself due to pain Finds that corticosteroid injections are helpful but wear off after about 2 months Would like knee replacements eventually but is not in a good living situation right now for recovery from this surgery Would like knee injections today, last injection was November 6  PERTINENT  PMH / PSH: Osteoarthritis, bipolar disorder, degenerative arthritis of bilateral knees, chronic knee pain   OBJECTIVE: BP 118/78   Pulse 94   Wt 262 lb (118.8 kg)   SpO2 99%   BMI 32.75 kg/m   General: well appearing, appears stated age Musculoskeletal:  Bilateral knees - some swelling noted in the superior aspect of the left knee, otherwise no visual abnormalities.  Tender to palpation along joint line and on superior and inferior aspects of each knee.  Significant pain and crepitus on flexion and extension with normal ROM.  Strength 5/5 on knee flexion and extension.  Neurovascularly intact.  ASSESSMENT / PLAN:  Osteoarthritis of knee After consent was obtained, injections were given in bilateral knees.  Flexeril, Ultracet were refilled.  Discussed definitive treatment is knee replacement in the future.  Patient counseled that she must wait at least 3 months before another steroid injection.   Right knee injection Performing physician: Dr. Lezlie Octave Preprocedure diagnosis: Osteoarthritis of right knee Postprocedure diagnosis osteoarthritis of right knee Procedure: Informed consent was obtained after risks benefits and alternatives were discussed with patient.  Area disinfected with iodine swab.  Further disinfected with alcohol swab x1.  Area sprayed with ethyl chloride cold spray.  A 4:1 lidocaine to Depo-Medrol injection was injected into the knee using an  anterolateral knee approach.  Bandage was placed over the wound.  Left knee injection Performing physician: Dr. Lezlie Octave Preprocedure diagnosis: Osteoarthritis of left knee Postprocedure diagnosis osteoarthritis of left knee Procedure: Informed consent was obtained after risks benefits and alternatives were discussed with patient.  Area disinfected with iodine swab.  Further disinfected with alcohol swab x1.  Area sprayed with ethyl chloride cold spray.  A 4:1 lidocaine to Depo-Medrol injection was injected into the knee using an anterolateral knee approach.  Bandage was placed over the wound.   Lennox Solders, MD [facility]

## 2019-05-11 NOTE — Patient Instructions (Addendum)
It was nice seeing you today Ms. Osorto!  I have sent your Flexeril and Ultracet to your pharmacy. I hope that these knee injections will reduce your knee pain for the next few months. You can get repeat knee injections and 3 months or longer from this date. Good job with cutting down on your smoking, keep up the hard work.  If you have any questions or concerns, please feel free to call the clinic.   Be well,  Dr. Frances Furbish

## 2019-05-11 NOTE — Assessment & Plan Note (Signed)
After consent was obtained, injections were given in bilateral knees.  Flexeril, Ultracet were refilled.  Discussed definitive treatment is knee replacement in the future.  Patient counseled that she must wait at least 3 months before another steroid injection.

## 2019-05-12 DIAGNOSIS — M25569 Pain in unspecified knee: Secondary | ICD-10-CM | POA: Diagnosis not present

## 2019-05-15 DIAGNOSIS — M25569 Pain in unspecified knee: Secondary | ICD-10-CM | POA: Diagnosis not present

## 2019-05-16 DIAGNOSIS — M25569 Pain in unspecified knee: Secondary | ICD-10-CM | POA: Diagnosis not present

## 2019-05-17 DIAGNOSIS — M25569 Pain in unspecified knee: Secondary | ICD-10-CM | POA: Diagnosis not present

## 2019-05-18 ENCOUNTER — Telehealth: Payer: Self-pay | Admitting: Pharmacist

## 2019-05-18 DIAGNOSIS — F3181 Bipolar II disorder: Secondary | ICD-10-CM | POA: Diagnosis not present

## 2019-05-18 DIAGNOSIS — F17200 Nicotine dependence, unspecified, uncomplicated: Secondary | ICD-10-CM

## 2019-05-18 NOTE — Telephone Encounter (Signed)
Patient contacted to evaluate progress with tobacco cessation.   She is positive and upbeat today, as she was enjoying her 46month-old grand-child.   Continues to smoke black-mild a few puffs per day.  Denies any single day with complete abstinence but has had days of only a few puffs (less than a full cigarette).   Encouraged success and progress.    Plan additional follow-up in 7-10 days.

## 2019-05-18 NOTE — Assessment & Plan Note (Signed)
Patient contacted to evaluate progress with tobacco cessation.   She is positive and upbeat today, as she was enjoying her 5month-old grand-child.   Continues to smoke black-mild a few puffs per day.  Denies any single day with complete abstinence but has had days of only a few puffs (less than a full cigarette).   Encouraged success and progress.    Plan additional follow-up in 7-10 days.  

## 2019-05-19 DIAGNOSIS — M25569 Pain in unspecified knee: Secondary | ICD-10-CM | POA: Diagnosis not present

## 2019-05-20 ENCOUNTER — Other Ambulatory Visit: Payer: Self-pay

## 2019-05-20 MED ORDER — PANTOPRAZOLE SODIUM 40 MG PO TBEC: DELAYED_RELEASE_TABLET | ORAL | 2 refills | Status: DC

## 2019-05-21 DIAGNOSIS — M25569 Pain in unspecified knee: Secondary | ICD-10-CM | POA: Diagnosis not present

## 2019-05-24 DIAGNOSIS — M25569 Pain in unspecified knee: Secondary | ICD-10-CM | POA: Diagnosis not present

## 2019-05-25 DIAGNOSIS — M25569 Pain in unspecified knee: Secondary | ICD-10-CM | POA: Diagnosis not present

## 2019-05-26 DIAGNOSIS — M25569 Pain in unspecified knee: Secondary | ICD-10-CM | POA: Diagnosis not present

## 2019-05-27 ENCOUNTER — Telehealth: Payer: Self-pay | Admitting: Pharmacist

## 2019-05-27 NOTE — Telephone Encounter (Signed)
Contacted patient RE  Tobacco Cessation   Patient reports quitting smoking completely for 1 week.  She noted that her older daughter is trying to get pregnant and has recently been started on varenicline.    Patient continues on varenicline 1mg  BID.  Patient states confidence in remaining quit.  She was more positive about her future at this visit.  Her breathing is stable.  She continues to take [facility] routinely (most days) and Albuterol PRN.   Follow-up planned in 2 weeks by phone.

## 2019-05-27 NOTE — Telephone Encounter (Signed)
Reviewed and agree.

## 2019-05-29 DIAGNOSIS — M25569 Pain in unspecified knee: Secondary | ICD-10-CM | POA: Diagnosis not present

## 2019-05-30 DIAGNOSIS — M25569 Pain in unspecified knee: Secondary | ICD-10-CM | POA: Diagnosis not present

## 2019-06-01 DIAGNOSIS — M25569 Pain in unspecified knee: Secondary | ICD-10-CM | POA: Diagnosis not present

## 2019-06-02 DIAGNOSIS — M25569 Pain in unspecified knee: Secondary | ICD-10-CM | POA: Diagnosis not present

## 2019-06-07 DIAGNOSIS — M25569 Pain in unspecified knee: Secondary | ICD-10-CM | POA: Diagnosis not present

## 2019-06-08 DIAGNOSIS — M25569 Pain in unspecified knee: Secondary | ICD-10-CM | POA: Diagnosis not present

## 2019-06-10 ENCOUNTER — Telehealth: Payer: Self-pay | Admitting: Pharmacist

## 2019-06-10 DIAGNOSIS — M25569 Pain in unspecified knee: Secondary | ICD-10-CM | POA: Diagnosis not present

## 2019-06-10 DIAGNOSIS — F17200 Nicotine dependence, unspecified, uncomplicated: Secondary | ICD-10-CM

## 2019-06-10 NOTE — Assessment & Plan Note (Signed)
Patient contact RE tobacco cessation.  Patient initially reported that she feels "depressed" and explains that it is all related to her "moldy apartment".    She admitted smoking 1 puff this AM, otherwise reports quitting for the last week.  (Quit date today 06/10/2019).  She believes she is 8/10 for staying quit for the next week.   I encouraged her to continue with abstinence, verbalized my appreciation for her stressors but also shared that her stressor have not lead to a full relapse at this time.   She continues on the varenicline 1mg  BID at this time.   She is encouraged by her daughter also quitting recently.   I plan to follow-up in 1 week.

## 2019-06-10 NOTE — Telephone Encounter (Signed)
Patient contact RE tobacco cessation.  Patient initially reported that she feels "depressed" and explains that it is all related to her "moldy apartment".    She admitted smoking 1 puff this AM, otherwise reports quitting for the last week.  (Quit date today 06/10/2019).  She believes she is 8/10 for staying quit for the next week.   I encouraged her to continue with abstinence, verbalized my appreciation for her stressors but also shared that her stressor have not lead to a full relapse at this time.   She continues on the varenicline 1mg BID at this time.   She is encouraged by her daughter also quitting recently.   I plan to follow-up in 1 week.  

## 2019-06-10 NOTE — Telephone Encounter (Signed)
Noted and agree. 

## 2019-06-11 DIAGNOSIS — M25569 Pain in unspecified knee: Secondary | ICD-10-CM | POA: Diagnosis not present

## 2019-06-14 DIAGNOSIS — M25569 Pain in unspecified knee: Secondary | ICD-10-CM | POA: Diagnosis not present

## 2019-06-15 DIAGNOSIS — M25569 Pain in unspecified knee: Secondary | ICD-10-CM | POA: Diagnosis not present

## 2019-06-17 ENCOUNTER — Telehealth: Payer: Self-pay | Admitting: Pharmacist

## 2019-06-17 NOTE — Telephone Encounter (Signed)
Contacted patient RE tobacco cessation.  Patient reported 2 cigarettes in the last week.  Unfortunately was unable to remain completely smoke free.  She has plans to spend time with her grandbaby today and states she plans to avoid all cigarettes today.  Encouraged to continue with complete abstinence plan.  Follow-up again in 1 week.

## 2019-06-18 DIAGNOSIS — M25569 Pain in unspecified knee: Secondary | ICD-10-CM | POA: Diagnosis not present

## 2019-06-19 DIAGNOSIS — M25569 Pain in unspecified knee: Secondary | ICD-10-CM | POA: Diagnosis not present

## 2019-06-20 DIAGNOSIS — M25569 Pain in unspecified knee: Secondary | ICD-10-CM | POA: Diagnosis not present

## 2019-06-21 ENCOUNTER — Other Ambulatory Visit: Payer: Self-pay

## 2019-06-21 ENCOUNTER — Ambulatory Visit (INDEPENDENT_AMBULATORY_CARE_PROVIDER_SITE_OTHER): Payer: Medicaid Other | Admitting: Family Medicine

## 2019-06-21 DIAGNOSIS — L819 Disorder of pigmentation, unspecified: Secondary | ICD-10-CM | POA: Diagnosis not present

## 2019-06-21 DIAGNOSIS — M25569 Pain in unspecified knee: Secondary | ICD-10-CM | POA: Diagnosis not present

## 2019-06-21 MED ORDER — CYCLOBENZAPRINE HCL 5 MG PO TABS: 5.0000 mg | ORAL_TABLET | Freq: Three times a day (TID) | ORAL | 0 refills | Status: DC | PRN

## 2019-06-21 MED ORDER — TRAZODONE HCL 50 MG PO TABS: 50.0000 mg | ORAL_TABLET | Freq: Every evening | ORAL | 0 refills | Status: DC | PRN

## 2019-06-21 MED ORDER — CLOTRIMAZOLE-BETAMETHASONE 1-0.05 % EX CREA: 1.0000 "application " | TOPICAL_CREAM | Freq: Two times a day (BID) | CUTANEOUS | 1 refills | Status: DC

## 2019-06-21 NOTE — Patient Instructions (Signed)
It was great meeting you today!  We will give you a cream called Lotrisone which should cover almost all of likely causing a rash.  Take this 2 times per day for 2 weeks.  I refilled your muscle relaxer and your sleep medication.

## 2019-06-22 DIAGNOSIS — L819 Disorder of pigmentation, unspecified: Secondary | ICD-10-CM | POA: Insufficient documentation

## 2019-06-22 HISTORY — DX: Disorder of pigmentation, unspecified: L81.9

## 2019-06-22 NOTE — Assessment & Plan Note (Signed)
Given that cannot perform KOH prep, will treat with Lotrisone to cover for both ringworm and eczema.  Can follow-up as needed for this issue.

## 2019-06-22 NOTE — Progress Notes (Signed)
   CHIEF COMPLAINT / HPI: 54 year old female who presents for rash on her face and neck.  Patient is convinced it is ringworm due to "black mold growing in her house".  Patient has had circular hypopigmented pruritic areas develop on her neck and face.  She does not want me to scrape it for KOH prep because she knows it is ringworm.  PERTINENT  PMH / PSH:    OBJECTIVE: BP 128/88   Pulse 77   Ht 6\' 3"  (1.905 m)   Wt 260 lb 12.8 oz (118.3 kg)   SpO2 98%   BMI 32.60 kg/m   Gen: [name], no acute distress HEENT: Hypopigmented, circular, pruritic areas noted on neck and face CV: Skin warm and dry Resp: No accessory muscle use Neuro: No focal neurologic deficit  ASSESSMENT / PLAN:  Hypopigmented skin lesion Given that cannot perform KOH prep, will treat with Lotrisone to cover for both ringworm and eczema.  Can follow-up as needed for this issue.     40 MD PGY-3 Family Medicine Resident [facility]

## 2019-06-23 ENCOUNTER — Telehealth: Payer: Self-pay | Admitting: Pharmacist

## 2019-06-23 DIAGNOSIS — M25569 Pain in unspecified knee: Secondary | ICD-10-CM | POA: Diagnosis not present

## 2019-06-23 NOTE — Telephone Encounter (Signed)
Contacted patient has she reported NO smoking at all since last contact ~ 1 week ago.   She has QUIT for short periods in the past.  We discussed need to stay focused on her and her breathing which she admits has improved since cutting back and now stopping smoking.  Her living situation remains challenging, she is still in her moldy apartment.  She is positive about her grandchild.   Continue Varenicline 1mg  BID at this time.  Reevaluate success and discuss total duration of varenicline (possibly 1-2 more months) at next contact by phone in 2 weeks.

## 2019-06-23 NOTE — Telephone Encounter (Signed)
Noted and agree. 

## 2019-06-24 DIAGNOSIS — M25569 Pain in unspecified knee: Secondary | ICD-10-CM | POA: Diagnosis not present

## 2019-06-26 DIAGNOSIS — M25569 Pain in unspecified knee: Secondary | ICD-10-CM | POA: Diagnosis not present

## 2019-06-27 DIAGNOSIS — M25569 Pain in unspecified knee: Secondary | ICD-10-CM | POA: Diagnosis not present

## 2019-06-30 DIAGNOSIS — M25569 Pain in unspecified knee: Secondary | ICD-10-CM | POA: Diagnosis not present

## 2019-07-01 DIAGNOSIS — M25569 Pain in unspecified knee: Secondary | ICD-10-CM | POA: Diagnosis not present

## 2019-07-02 DIAGNOSIS — M25569 Pain in unspecified knee: Secondary | ICD-10-CM | POA: Diagnosis not present

## 2019-07-05 ENCOUNTER — Telehealth: Payer: Self-pay | Admitting: Pharmacist

## 2019-07-05 NOTE — Telephone Encounter (Signed)
Noted and agree. 

## 2019-07-05 NOTE — Telephone Encounter (Signed)
Attempted contact of patient for follow-up of tobacco cessation.  She was unavailable at first, then called back immediately.   She had many positives to report in her life.  She is planning a beach vacation.  She is in the process of a settlement with the [place] [employer] [place] for her housing challenges over the past year.  She shared that she may need medical records or a letter.  She was encouraged to contact our office with a formal request if needed from records or for an additional PCP letter.   She also reported continued abstinence from smoking for 2 weeks.   She sounded positive about continued abstinence and was encouraged to maintain her success.  We discussed continuation of current plan until contact in 2 weeks by phone.   Continue varenicline for additional 1-2 months at current dose.

## 2019-07-28 ENCOUNTER — Telehealth: Payer: Self-pay | Admitting: Pharmacist

## 2019-07-28 DIAGNOSIS — J449 Chronic obstructive pulmonary disease, unspecified: Secondary | ICD-10-CM

## 2019-07-28 MED ORDER — VARENICLINE TARTRATE 1 MG PO TABS: 1.0000 mg | ORAL_TABLET | Freq: Two times a day (BID) | ORAL | 3 refills | Status: DC

## 2019-07-28 NOTE — Telephone Encounter (Signed)
Contacted patient RE tobacco cessation.  She reports a return to smoking recently when she smoked 1 pack of cigarettes in 3 days.   She states this was due to stress AND she continues to feel stress with anxiety and some feelings of depressed mood.  This is due to her apartment mold and she reports that she is not eating when at home,  "all of my food in my refrigerator is moldy".   She is meeting with the [facility] public housing team later today.  She hopes they will move her to a new location.   She requested a refill on her Chantix (varenicline) 1mg  BID - Refill sent.   Next call back planned for 2 weeks.

## 2019-07-28 NOTE — Telephone Encounter (Signed)
Noted and agree. 

## 2019-08-02 ENCOUNTER — Other Ambulatory Visit: Payer: Self-pay

## 2019-08-02 ENCOUNTER — Encounter: Payer: Self-pay | Admitting: Family Medicine

## 2019-08-02 ENCOUNTER — Ambulatory Visit (INDEPENDENT_AMBULATORY_CARE_PROVIDER_SITE_OTHER): Payer: Medicaid Other | Admitting: Family Medicine

## 2019-08-02 DIAGNOSIS — M170 Bilateral primary osteoarthritis of knee: Secondary | ICD-10-CM | POA: Diagnosis not present

## 2019-08-02 DIAGNOSIS — M179 Osteoarthritis of knee, unspecified: Secondary | ICD-10-CM | POA: Diagnosis not present

## 2019-08-02 MED ORDER — METHYLPREDNISOLONE ACETATE 40 MG/ML IJ SUSP
80.0000 mg | Freq: Once | INTRAMUSCULAR | Status: AC
  Administered 2019-08-02: 80 mg via INTRA_ARTICULAR

## 2019-08-02 MED ORDER — TRAMADOL-ACETAMINOPHEN 37.5-325 MG PO TABS: 1.0000 | ORAL_TABLET | Freq: Four times a day (QID) | ORAL | 0 refills | Status: DC | PRN

## 2019-08-02 NOTE — Assessment & Plan Note (Signed)
Bilateral osteoarthritis with joint replacement planned in the semi-near future.  Steroid injections are an appropriate temporizing measure.  They will help her stay active until her surgery.

## 2019-08-02 NOTE — Assessment & Plan Note (Signed)
>>  ASSESSMENT AND PLAN FOR KNEE PAIN, RIGHT, CHRONIC WRITTEN ON 02/26/2022  5:39 PM BY [name], MD  >>ASSESSMENT AND PLAN FOR ARTHRITIS OF KNEE, DEGENERATIVE WRITTEN ON 08/02/2019  3:20 PM BY HENSEL, WILLIAM A, MD  Bilateral osteoarthritis with joint replacement planned in the semi-near future.  Steroid injections are an appropriate temporizing measure.  They will help her stay active until her surgery.

## 2019-08-02 NOTE — Progress Notes (Signed)
    SUBJECTIVE:   CHIEF COMPLAINT / HPI:   Pain in both knees.  [name] has known osteoarthritis of both knees.  She requests steroid injection in both.  She has had prior steroid injections, which work well but do no last more than a few weeks.  She is awaiting knee replacement surgery.  She states that because she has "mold in her apartment" she is waiting to move before the surgery.  She is aware of the temporary relief and of the possible complications of bleeding and infection.  She desires to proceed.    OBJECTIVE:   BP 132/78   Pulse 75   Ht 6\' 3"  (1.905 m)   Wt 261 lb (118.4 kg)   SpO2 100%   BMI 32.62 kg/m   Despite mild bilateral joint effusions and bony irregularities of both knees, I was able to locate the joint line bilaterally.  Informed consent obtained.  Time out taken.  Bilateral knee injections with 40 mg depo medrol and 3 cc of xylocaine.  Patient tolerated procedure well and left clinic in good condition.  ASSESSMENT/PLAN:   No problem-specific Assessment & Plan notes found for this encounter.     , MD [facility]

## 2019-08-02 NOTE — Assessment & Plan Note (Signed)
>>  ASSESSMENT AND PLAN FOR ARTHRITIS OF KNEE, DEGENERATIVE WRITTEN ON 08/02/2019  3:20 PM BY HENSEL, WILLIAM A, MD  Bilateral osteoarthritis with joint replacement planned in the semi-near future.  Steroid injections are an appropriate temporizing measure.  They will help her stay active until her surgery.

## 2019-08-02 NOTE — Patient Instructions (Signed)
I hope you get more than a short time of help with the injections.  I hope your knees aren't as sore as you think they'll be over the next couple of days.   Good luck with your move. I sent in some pain medicine.   Call immediately if redness or fever.

## 2019-08-03 DIAGNOSIS — M179 Osteoarthritis of knee, unspecified: Secondary | ICD-10-CM | POA: Diagnosis not present

## 2019-08-04 DIAGNOSIS — M179 Osteoarthritis of knee, unspecified: Secondary | ICD-10-CM | POA: Diagnosis not present

## 2019-08-05 DIAGNOSIS — M179 Osteoarthritis of knee, unspecified: Secondary | ICD-10-CM | POA: Diagnosis not present

## 2019-08-06 DIAGNOSIS — M179 Osteoarthritis of knee, unspecified: Secondary | ICD-10-CM | POA: Diagnosis not present

## 2019-08-07 DIAGNOSIS — M5090 Cervical disc disorder, unspecified, unspecified cervical region: Secondary | ICD-10-CM | POA: Diagnosis not present

## 2019-08-08 DIAGNOSIS — M5090 Cervical disc disorder, unspecified, unspecified cervical region: Secondary | ICD-10-CM | POA: Diagnosis not present

## 2019-08-09 DIAGNOSIS — M5090 Cervical disc disorder, unspecified, unspecified cervical region: Secondary | ICD-10-CM | POA: Diagnosis not present

## 2019-08-10 DIAGNOSIS — M5090 Cervical disc disorder, unspecified, unspecified cervical region: Secondary | ICD-10-CM | POA: Diagnosis not present

## 2019-08-11 DIAGNOSIS — M5090 Cervical disc disorder, unspecified, unspecified cervical region: Secondary | ICD-10-CM | POA: Diagnosis not present

## 2019-08-12 ENCOUNTER — Telehealth: Payer: Self-pay | Admitting: Pharmacist

## 2019-08-12 DIAGNOSIS — M5090 Cervical disc disorder, unspecified, unspecified cervical region: Secondary | ICD-10-CM | POA: Diagnosis not present

## 2019-08-12 NOTE — Telephone Encounter (Signed)
Contacted patient to discuss continued abstinence and potential tapering/discontinuation of tobacco cessation - varenicline.   I left message that I will plan to attempt contact again next week.

## 2019-08-13 DIAGNOSIS — M5090 Cervical disc disorder, unspecified, unspecified cervical region: Secondary | ICD-10-CM | POA: Diagnosis not present

## 2019-08-14 DIAGNOSIS — M5090 Cervical disc disorder, unspecified, unspecified cervical region: Secondary | ICD-10-CM | POA: Diagnosis not present

## 2019-08-15 DIAGNOSIS — M5090 Cervical disc disorder, unspecified, unspecified cervical region: Secondary | ICD-10-CM | POA: Diagnosis not present

## 2019-08-16 DIAGNOSIS — M5090 Cervical disc disorder, unspecified, unspecified cervical region: Secondary | ICD-10-CM | POA: Diagnosis not present

## 2019-08-17 DIAGNOSIS — M5090 Cervical disc disorder, unspecified, unspecified cervical region: Secondary | ICD-10-CM | POA: Diagnosis not present

## 2019-08-18 DIAGNOSIS — M5090 Cervical disc disorder, unspecified, unspecified cervical region: Secondary | ICD-10-CM | POA: Diagnosis not present

## 2019-08-19 DIAGNOSIS — M5090 Cervical disc disorder, unspecified, unspecified cervical region: Secondary | ICD-10-CM | POA: Diagnosis not present

## 2019-08-20 DIAGNOSIS — M5090 Cervical disc disorder, unspecified, unspecified cervical region: Secondary | ICD-10-CM | POA: Diagnosis not present

## 2019-08-21 ENCOUNTER — Other Ambulatory Visit: Payer: Self-pay | Admitting: Family Medicine

## 2019-08-21 DIAGNOSIS — M5090 Cervical disc disorder, unspecified, unspecified cervical region: Secondary | ICD-10-CM | POA: Diagnosis not present

## 2019-08-22 DIAGNOSIS — M5090 Cervical disc disorder, unspecified, unspecified cervical region: Secondary | ICD-10-CM | POA: Diagnosis not present

## 2019-08-23 DIAGNOSIS — M5090 Cervical disc disorder, unspecified, unspecified cervical region: Secondary | ICD-10-CM | POA: Diagnosis not present

## 2019-08-24 ENCOUNTER — Telehealth: Payer: Self-pay | Admitting: Family Medicine

## 2019-08-24 DIAGNOSIS — M5090 Cervical disc disorder, unspecified, unspecified cervical region: Secondary | ICD-10-CM | POA: Diagnosis not present

## 2019-08-24 NOTE — Telephone Encounter (Signed)
Patient needs refill on acid reflux meds. Thanks

## 2019-08-25 ENCOUNTER — Other Ambulatory Visit: Payer: Self-pay

## 2019-08-25 ENCOUNTER — Encounter: Payer: Self-pay | Admitting: Family Medicine

## 2019-08-25 ENCOUNTER — Ambulatory Visit (INDEPENDENT_AMBULATORY_CARE_PROVIDER_SITE_OTHER): Payer: Medicaid Other | Admitting: Family Medicine

## 2019-08-25 ENCOUNTER — Other Ambulatory Visit (HOSPITAL_COMMUNITY)
Admission: RE | Admit: 2019-08-25 | Discharge: 2019-08-25 | Disposition: A | Discharge status: Home / Self Care | Payer: Medicaid Other | Source: Ambulatory Visit | Attending: Family Medicine | Admitting: Family Medicine

## 2019-08-25 VITALS — BP 124/70 | HR 74 | Ht 75.0 in | Wt 257.0 lb

## 2019-08-25 DIAGNOSIS — M170 Bilateral primary osteoarthritis of knee: Secondary | ICD-10-CM

## 2019-08-25 DIAGNOSIS — G8929 Other chronic pain: Secondary | ICD-10-CM | POA: Diagnosis not present

## 2019-08-25 DIAGNOSIS — Z0000 Encounter for general adult medical examination without abnormal findings: Secondary | ICD-10-CM | POA: Diagnosis not present

## 2019-08-25 DIAGNOSIS — Z124 Encounter for screening for malignant neoplasm of cervix: Secondary | ICD-10-CM

## 2019-08-25 DIAGNOSIS — M5090 Cervical disc disorder, unspecified, unspecified cervical region: Secondary | ICD-10-CM | POA: Diagnosis not present

## 2019-08-25 DIAGNOSIS — M25561 Pain in right knee: Secondary | ICD-10-CM

## 2019-08-25 DIAGNOSIS — Z01419 Encounter for gynecological examination (general) (routine) without abnormal findings: Secondary | ICD-10-CM | POA: Insufficient documentation

## 2019-08-25 MED ORDER — TRAMADOL-ACETAMINOPHEN 37.5-325 MG PO TABS: 1.0000 | ORAL_TABLET | Freq: Four times a day (QID) | ORAL | 0 refills | Status: DC | PRN

## 2019-08-25 NOTE — Progress Notes (Signed)
    SUBJECTIVE:   CHIEF COMPLAINT / HPI:   Well woman exam: Patient presents today for a well woman exam.  Reports that she is sexually active, currently sexually active with one female partner, denies any vaginal discharge, odors, itching or other concerns.  Not have any concerns at this point in time.  Right knee pain: Patient continues to have right knee pain, this is a chronic issue for her.  Recently had knee injection but does not feel that this has changed or reduce any of her pain.  Is requesting a refill for Ultracet.  No reported loss of sensation, weakness, swelling, or rashes.  Smoking: Patient is followed closely by pharmacy for smoking cessation  PERTINENT  PMH / PSH:  Chronic right knee pain Acid reflux History of tobacco use   OBJECTIVE:   BP 124/70   Pulse 74   Ht 6\' 3"  (1.905 m)   Wt 257 lb (116.6 kg)   SpO2 97%   BMI 32.12 kg/m    Physical exam: General: Well-appearing female, pleasant Respiratory: Speaking complete sentences GU: Cervix is difficult to appreciate from the angle, no concerning lesions to labia, no erythema or discharge appreciated, normal-appearing vaginal rugae, normal appearing cervix  ASSESSMENT/PLAN:   Well woman exam with routine gynecological exam Pap smear performed today with GC/chlamydia testing. -We will contact patient with results  Right knee pain Right knee with continued pain, unchanged after joint injection.  No concerning evidence for infection of joints, no swelling or erythema appreciated, no weakness or change in sensation. -Refilled Ultracet     , DO [facility]

## 2019-08-25 NOTE — Assessment & Plan Note (Signed)
Pap smear performed today with GC/chlamydia testing. -We will contact patient with results

## 2019-08-25 NOTE — Patient Instructions (Signed)
Thank you for coming in to see [place] today! Please see below to review our plan for today's visit:  I will call you with results of your testing! Please let us know if there are any other concerns in the mean time.   Please call the clinic at [phone] if your symptoms worsen or you have any concerns. It was our pleasure to serve you!   Dr. Peggyann Shoals [facility]

## 2019-08-25 NOTE — Assessment & Plan Note (Signed)
Right knee with continued pain, unchanged after joint injection.  No concerning evidence for infection of joints, no swelling or erythema appreciated, no weakness or change in sensation. -Refilled Ultracet

## 2019-08-26 DIAGNOSIS — M5090 Cervical disc disorder, unspecified, unspecified cervical region: Secondary | ICD-10-CM | POA: Diagnosis not present

## 2019-08-26 LAB — CYTOLOGY - PAP
Chlamydia: NEGATIVE
Comment: NEGATIVE
Comment: NEGATIVE
Comment: NEGATIVE
Comment: NORMAL
Diagnosis: NEGATIVE
High risk HPV: NEGATIVE
Neisseria Gonorrhea: NEGATIVE
Trichomonas: NEGATIVE

## 2019-08-26 NOTE — Progress Notes (Signed)
Patient called and identified with name and DOB, was told results of PAP are normal, also testing negative for Gonorrhea, Chlamydia, Trichomonas, and HPV.   Peggyann Shoals, DO [facility], PGY-2 08/26/2019 3:13 PM

## 2019-08-27 DIAGNOSIS — M5090 Cervical disc disorder, unspecified, unspecified cervical region: Secondary | ICD-10-CM | POA: Diagnosis not present

## 2019-08-28 ENCOUNTER — Telehealth: Payer: Self-pay | Admitting: Pharmacist

## 2019-08-28 DIAGNOSIS — M5090 Cervical disc disorder, unspecified, unspecified cervical region: Secondary | ICD-10-CM | POA: Diagnosis not present

## 2019-08-28 NOTE — Telephone Encounter (Signed)
Tobacco cessation follow-up call.   No answer - plan follow-up in 3-5 days.

## 2019-08-29 DIAGNOSIS — M5090 Cervical disc disorder, unspecified, unspecified cervical region: Secondary | ICD-10-CM | POA: Diagnosis not present

## 2019-08-30 DIAGNOSIS — M5090 Cervical disc disorder, unspecified, unspecified cervical region: Secondary | ICD-10-CM | POA: Diagnosis not present

## 2019-08-31 DIAGNOSIS — M5090 Cervical disc disorder, unspecified, unspecified cervical region: Secondary | ICD-10-CM | POA: Diagnosis not present

## 2019-09-01 DIAGNOSIS — F3181 Bipolar II disorder: Secondary | ICD-10-CM | POA: Diagnosis not present

## 2019-09-01 DIAGNOSIS — Z79899 Other long term (current) drug therapy: Secondary | ICD-10-CM | POA: Diagnosis not present

## 2019-09-01 DIAGNOSIS — S91309A Unspecified open wound, unspecified foot, initial encounter: Secondary | ICD-10-CM | POA: Diagnosis not present

## 2019-09-01 DIAGNOSIS — Z79891 Long term (current) use of opiate analgesic: Secondary | ICD-10-CM | POA: Diagnosis not present

## 2019-09-01 DIAGNOSIS — M5090 Cervical disc disorder, unspecified, unspecified cervical region: Secondary | ICD-10-CM | POA: Diagnosis not present

## 2019-09-02 ENCOUNTER — Telehealth: Payer: Self-pay | Admitting: Pharmacist

## 2019-09-02 DIAGNOSIS — M5090 Cervical disc disorder, unspecified, unspecified cervical region: Secondary | ICD-10-CM | POA: Diagnosis not present

## 2019-09-02 NOTE — Telephone Encounter (Signed)
Called again - without successful contact.  Asked to return call to my direct line.

## 2019-09-04 DIAGNOSIS — M5090 Cervical disc disorder, unspecified, unspecified cervical region: Secondary | ICD-10-CM | POA: Diagnosis not present

## 2019-09-05 DIAGNOSIS — M5090 Cervical disc disorder, unspecified, unspecified cervical region: Secondary | ICD-10-CM | POA: Diagnosis not present

## 2019-09-06 DIAGNOSIS — M5090 Cervical disc disorder, unspecified, unspecified cervical region: Secondary | ICD-10-CM | POA: Diagnosis not present

## 2019-09-07 DIAGNOSIS — M5090 Cervical disc disorder, unspecified, unspecified cervical region: Secondary | ICD-10-CM | POA: Diagnosis not present

## 2019-09-08 DIAGNOSIS — M5090 Cervical disc disorder, unspecified, unspecified cervical region: Secondary | ICD-10-CM | POA: Diagnosis not present

## 2019-09-09 DIAGNOSIS — M5090 Cervical disc disorder, unspecified, unspecified cervical region: Secondary | ICD-10-CM | POA: Diagnosis not present

## 2019-09-10 DIAGNOSIS — M179 Osteoarthritis of knee, unspecified: Secondary | ICD-10-CM | POA: Diagnosis not present

## 2019-09-11 DIAGNOSIS — M179 Osteoarthritis of knee, unspecified: Secondary | ICD-10-CM | POA: Diagnosis not present

## 2019-09-12 DIAGNOSIS — M179 Osteoarthritis of knee, unspecified: Secondary | ICD-10-CM | POA: Diagnosis not present

## 2019-09-13 DIAGNOSIS — M179 Osteoarthritis of knee, unspecified: Secondary | ICD-10-CM | POA: Diagnosis not present

## 2019-09-14 ENCOUNTER — Other Ambulatory Visit: Payer: Self-pay

## 2019-09-14 ENCOUNTER — Ambulatory Visit (INDEPENDENT_AMBULATORY_CARE_PROVIDER_SITE_OTHER): Payer: Medicaid Other | Admitting: Family Medicine

## 2019-09-14 VITALS — BP 140/82 | HR 72 | Ht 75.0 in | Wt 260.2 lb

## 2019-09-14 DIAGNOSIS — M179 Osteoarthritis of knee, unspecified: Secondary | ICD-10-CM | POA: Diagnosis not present

## 2019-09-14 DIAGNOSIS — K047 Periapical abscess without sinus: Secondary | ICD-10-CM

## 2019-09-14 MED ORDER — AMOXICILLIN-POT CLAVULANATE 875-125 MG PO TABS: 1.0000 | ORAL_TABLET | Freq: Two times a day (BID) | ORAL | 0 refills | Status: AC

## 2019-09-14 NOTE — Progress Notes (Signed)
    SUBJECTIVE:   CHIEF COMPLAINT / HPI:   Dental abscess Patient comes in with complaint of dental abscess, she says this 1 has been going on for over a week.  She says in the past whenever she gets that she takes amoxicillin and it goes away.  She is firm that she does not want to go to the emergency department for further treatment and just wants her amoxicillin.  She does not have a dental appointment set at this point.  She did eat a tenderloin for breakfast this morning and said that while she had to chew on the left side of her mouth she did not have any trouble swallowing there is no airway involvement.    PERTINENT  PMH / PSH: History of dental issues  OBJECTIVE:   BP 140/82   Pulse 72   Ht 6\' 3"  (1.905 m)   Wt 260 lb 3.2 oz (118 kg)   SpO2 100%   BMI 32.52 kg/m   General: Patient is alert but uncomfortable, no respiratory Oral exam: She does have obvious visible swelling to the right maxillary area of her face.  Internal oral exam shows no throat involvement and airway is clear with no drooling or stridor but there is a obvious and palpable tender approximately 2-1/2 inch accumulation on the right maxillary area, from internal oral exam this appears to be visible along the exterior superior gumline.  There is no lower jaw involvement   ASSESSMENT/PLAN:   Dental abscess Patient comes in with complaint of dental abscess, she says this 1 has been going on for over a week.  She says in the past whenever she gets that she takes amoxicillin and it goes away.  She is firm that she does not want to go to the emergency department for further treatment and just wants her amoxicillin.  She does not have a dental appointment set at this point.  She did eat a tenderloin for breakfast this morning and said that while she had to chew on the left side of her mouth she did not have any trouble swallowing there is no airway involvement.  She does have obvious visible swelling to the right  maxillary area of her face.  Internal oral exam shows no throat involvement and airway is clear with no drooling or stridor but there is a obvious and palpable tender approximately 2-1/2 inch accumulation on the right axillary area, from internal oral exam this appears to be visible along the exterior superior gumline.  I was very clear with the patient that I think this is a significant infection that has the potential to get worse, spread to bone, potentially impact airway at some point or cause her to go septic.  Untreated long enough these type of issues can be fatal.  I told her I thought that the size of this was too much to be handled in our office and that she should consider going to the emergency department which she firmly refused despite medical advice.  Left with less than perfect options I felt the safest thing to do for this patient was to try to at least give them good outpatient antibiotic coverage and our Medicaid dentist list.  Patient was advised to come back on Friday and see Monday if she had not already been to the dentist and strict emergency precautions were discussed thoroughly.     [place], DO [facility]

## 2019-09-14 NOTE — Patient Instructions (Signed)
Today we talked about the dental abscess on the right side of your upper jaw.  Again I want to say that I am being very clear that I think that this is a large infection, I think it could potentially benefit from an incision and drainage that we cannot do here in the office as well as the potential of IV antibiotics.  This type of infection can spread and get worse and can eventually become fatal.  I am nervous that you do not have a dental appointment already and I am advising that you go to the hospital be checked out.  I know that you are refusing that so I am going to do the next best thing I can which is to give you antibiotics to try and hold you over into you will go to a dentist.  I would like you to come back to see [place] on Friday if you had not been to a dentist yet so that we can please make sure you are safe.  If at any point you get unable to eat, have difficulty breathing or increased drooling or you start getting fevers or the pain gets uncontrolled or you get dizzy or other concerning symptoms please go to the emergency department immediately  Dr. Parke Simmers

## 2019-09-15 DIAGNOSIS — M179 Osteoarthritis of knee, unspecified: Secondary | ICD-10-CM | POA: Diagnosis not present

## 2019-09-15 DIAGNOSIS — K047 Periapical abscess without sinus: Secondary | ICD-10-CM | POA: Insufficient documentation

## 2019-09-15 NOTE — Assessment & Plan Note (Addendum)
Patient comes in with complaint of dental abscess, she says this 1 has been going on for over a week.  She says in the past whenever she gets that she takes amoxicillin and it goes away.  She is firm that she does not want to go to the emergency department for further treatment and just wants her amoxicillin.  She does not have a dental appointment set at this point.  She did eat a tenderloin for breakfast this morning and said that while she had to chew on the left side of her mouth she did not have any trouble swallowing there is no airway involvement.  She does have obvious visible swelling to the right maxillary area of her face.  Internal oral exam shows no throat involvement and airway is clear with no drooling or stridor but there is a obvious and palpable tender approximately 2-1/2 inch accumulation on the right maxillary area, from internal oral exam this appears to be visible along the exterior superior gumline.  I was very clear with the patient that I think this is a significant infection that has the potential to get worse, spread to bone, potentially impact airway at some point or cause her to go septic.  Untreated long enough these type of issues can be fatal.  I told her I thought that the size of this was too much to be handled in our office and that she should consider going to the emergency department which she firmly refused despite medical advice.  Left with less than perfect options I felt the safest thing to do for this patient was to try to at least give them good outpatient antibiotic coverage and our Medicaid dentist list.  Patient was advised to come back on Friday and see [place] if she had not already been to the dentist and strict emergency precautions were discussed thoroughly.

## 2019-09-16 ENCOUNTER — Telehealth: Payer: Self-pay | Admitting: Family Medicine

## 2019-09-16 DIAGNOSIS — M179 Osteoarthritis of knee, unspecified: Secondary | ICD-10-CM | POA: Diagnosis not present

## 2019-09-16 MED ORDER — AMOXICILLIN 500 MG PO CAPS: 500.0000 mg | ORAL_CAPSULE | Freq: Three times a day (TID) | ORAL | 0 refills | Status: AC

## 2019-09-16 NOTE — Telephone Encounter (Signed)
[name] reports that she was seen in the clinic on 6/22 for a dental root abscess.  At the clinic, she was encouraged to be seen in the ED due to the significant size of the abscess.  She declined to be assessed in the ED and instead was sent home with Augmentin and encouraged to follow-up soon with a dentist.  She has been taking Augmentin for the past 2 days and has been experiencing significant loose stools to the point where she is very raw and noting blood on the toilet paper from the constant wiping and exposure to her stool.  She is requesting to be transitioned to amoxicillin as opposed to the combination amoxicillin-clavulanate.  She would also like to know if there is any barrier cream that she can put to soothe her backside.  She denies fever, nausea, vomiting and otherwise feels well apart from the significant diarrhea.  Amoxicillin 500 every 8 hours for 5 days was sent to her pharmacy.  She was advised to use Desitin or Vaseline as a barrier for her raw skin.  She was also encouraged to eat yogurt or use probiotics to help restore her gut microbiota.  Mirian Mo, MD

## 2019-09-17 DIAGNOSIS — M179 Osteoarthritis of knee, unspecified: Secondary | ICD-10-CM | POA: Diagnosis not present

## 2019-09-18 DIAGNOSIS — M179 Osteoarthritis of knee, unspecified: Secondary | ICD-10-CM | POA: Diagnosis not present

## 2019-09-19 DIAGNOSIS — M179 Osteoarthritis of knee, unspecified: Secondary | ICD-10-CM | POA: Diagnosis not present

## 2019-09-20 DIAGNOSIS — M179 Osteoarthritis of knee, unspecified: Secondary | ICD-10-CM | POA: Diagnosis not present

## 2019-09-21 DIAGNOSIS — M179 Osteoarthritis of knee, unspecified: Secondary | ICD-10-CM | POA: Diagnosis not present

## 2019-09-22 DIAGNOSIS — M179 Osteoarthritis of knee, unspecified: Secondary | ICD-10-CM | POA: Diagnosis not present

## 2019-09-23 DIAGNOSIS — M179 Osteoarthritis of knee, unspecified: Secondary | ICD-10-CM | POA: Diagnosis not present

## 2019-09-30 ENCOUNTER — Telehealth: Payer: Self-pay | Admitting: Pharmacist

## 2019-09-30 DIAGNOSIS — Z79891 Long term (current) use of opiate analgesic: Secondary | ICD-10-CM | POA: Diagnosis not present

## 2019-09-30 DIAGNOSIS — F3181 Bipolar II disorder: Secondary | ICD-10-CM | POA: Diagnosis not present

## 2019-09-30 NOTE — Telephone Encounter (Signed)
Contacted patient RE tobacco cessation.   Patient reports very low intake of ~ 1 cigarette per day at this time.  She states stress is her major trigger.   Patient continues to reside in the mold infested apartment.  She request copies of medical record that were previously provided ~ 1 year ago as she reports that her lawyer lost the previous copies.  I expressed that I would inquire in our office of the proper next steps and we would try to help.   Encouraged to continue working on complete abstinence from tobacco.   Plan phone follow-up in ~ 3-4 weeks.

## 2019-10-13 DIAGNOSIS — Z79899 Other long term (current) drug therapy: Secondary | ICD-10-CM | POA: Diagnosis not present

## 2019-10-13 DIAGNOSIS — M79671 Pain in right foot: Secondary | ICD-10-CM | POA: Diagnosis not present

## 2019-10-13 DIAGNOSIS — S91309A Unspecified open wound, unspecified foot, initial encounter: Secondary | ICD-10-CM | POA: Diagnosis not present

## 2019-10-22 ENCOUNTER — Telehealth: Payer: Self-pay | Admitting: Pharmacist

## 2019-10-22 DIAGNOSIS — F17200 Nicotine dependence, unspecified, uncomplicated: Secondary | ICD-10-CM

## 2019-10-22 DIAGNOSIS — J449 Chronic obstructive pulmonary disease, unspecified: Secondary | ICD-10-CM

## 2019-10-22 MED ORDER — VARENICLINE TARTRATE 1 MG PO TABS: 1.0000 mg | ORAL_TABLET | Freq: Two times a day (BID) | ORAL | 3 refills | Status: DC
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
## 2019-10-22 NOTE — Addendum Note (Signed)
Addended by: Kathrin Ruddy on: 10/22/2019 10:24 AM   Modules accepted: Orders
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
## 2019-10-25 ENCOUNTER — Other Ambulatory Visit: Payer: Self-pay | Admitting: Family Medicine

## 2019-10-25 NOTE — Telephone Encounter (Signed)
Noted and agree. 

## 2019-10-26 NOTE — Telephone Encounter (Signed)
Patient calls nurse line requesting a refill on antibiotic. Patient reports she has an apt on 8/5, the soonest she could get in. Patient reports she has a terrible abscess that is swollen and causing significant pain. Patient is requesting enough antibiotic to get her until apt. Please advise.

## 2019-10-27 ENCOUNTER — Other Ambulatory Visit: Payer: Self-pay | Admitting: Family Medicine

## 2019-10-27 DIAGNOSIS — F3181 Bipolar II disorder: Secondary | ICD-10-CM | POA: Diagnosis not present

## 2019-10-27 DIAGNOSIS — Z79891 Long term (current) use of opiate analgesic: Secondary | ICD-10-CM | POA: Diagnosis not present

## 2019-10-27 MED ORDER — AMOXICILLIN 500 MG PO CAPS: 500.0000 mg | ORAL_CAPSULE | Freq: Three times a day (TID) | ORAL | 0 refills | Status: AC

## 2019-10-27 NOTE — Telephone Encounter (Signed)
Please let patient know I have refilled medication for 5 days.  She needs to see a dentist before any more refills will be sent in.  Thank you  Dana Allan, MD Family Medicine Residency

## 2019-10-28 ENCOUNTER — Ambulatory Visit (INDEPENDENT_AMBULATORY_CARE_PROVIDER_SITE_OTHER): Payer: Medicaid Other | Admitting: Family Medicine

## 2019-10-28 ENCOUNTER — Telehealth: Payer: Self-pay

## 2019-10-28 ENCOUNTER — Other Ambulatory Visit: Payer: Self-pay

## 2019-10-28 ENCOUNTER — Encounter: Payer: Self-pay | Admitting: Family Medicine

## 2019-10-28 VITALS — BP 122/78 | HR 77 | Ht 75.0 in | Wt 249.0 lb

## 2019-10-28 DIAGNOSIS — M170 Bilateral primary osteoarthritis of knee: Secondary | ICD-10-CM | POA: Diagnosis present

## 2019-10-28 DIAGNOSIS — M25561 Pain in right knee: Secondary | ICD-10-CM

## 2019-10-28 DIAGNOSIS — G8929 Other chronic pain: Secondary | ICD-10-CM | POA: Diagnosis not present

## 2019-10-28 MED ORDER — TRAMADOL-ACETAMINOPHEN 37.5-325 MG PO TABS: 1.0000 | ORAL_TABLET | Freq: Four times a day (QID) | ORAL | 0 refills | Status: AC | PRN

## 2019-10-28 MED ORDER — METHYLPREDNISOLONE ACETATE 80 MG/ML IJ SUSP
80.0000 mg | Freq: Once | INTRAMUSCULAR | Status: AC
  Administered 2019-10-28: 80 mg via INTRA_ARTICULAR

## 2019-10-28 MED ORDER — FLUCONAZOLE 150 MG PO TABS: 150.0000 mg | ORAL_TABLET | Freq: Once | ORAL | 0 refills | Status: AC

## 2019-10-28 MED ORDER — PANTOPRAZOLE SODIUM 40 MG PO TBEC: 40.0000 mg | DELAYED_RELEASE_TABLET | Freq: Every day | ORAL | 2 refills | Status: DC

## 2019-10-28 NOTE — Progress Notes (Signed)
    SUBJECTIVE:   CHIEF COMPLAINT / HPI:   Bilateral knee injections Hx of knee injections due to chronic osteoarthritis.  Last injection 08/02/2019 was tolerated well.  She does not have long-lasting relief and gets injections every 3 months.  But would like to continue with injections.  She states that she is awaiting to move out of her apartment before she gets the surgery.  PERTINENT  PMH / PSH: Dental abscess (endorses right cheek swollen.  Plans to go see tooth urgent currently taking a 5-day course of amoxicillin); would like treatment for yeast infection since antibiotics always give her yeast infections.  OBJECTIVE:   BP 122/78   Pulse 77   Ht 6\' 3"  (1.905 m)   Wt 249 lb (112.9 kg)   SpO2 98%   BMI 31.12 kg/m   General: Appears well, no acute distress. Age appropriate. Cardiac: RRR, normal heart sounds, no murmurs Respiratory: CTAB, normal effort MSK/Ext: Mild bilateral joint effusions. Joint line palpated bilaterally.   ASSESSMENT/PLAN:   Osteoarthritis of knee PRE-OP DIAGNOSIS: Osteoarthritis of knee bilaterally POST-OP DIAGNOSIS: Same  PROCEDURE: joint injection Performing Physician: Dr. Supervising Physician (if applicable): Dr. Salvadore Dom   Dose: Lidocaine 3  mL   Steroid 80mg /mL Depo-Medrol             Procedure: The area was prepped in the usual sterile manner. The needle was inserted into the affected area and the steroid was injected.  There were no complications during this procedure.   Followup: The patient tolerated the procedure well without complications.  Standard post-procedure care is explained and return precautions are given.   Cathy Anna, DO [facility]

## 2019-10-28 NOTE — Telephone Encounter (Signed)
Patient returns call to nurse line regarding receiving rx for pain medication. Spoke with Dr. Salvadore Dom regarding patient. Provider will send in script at this time.   Veronda Prude, RN

## 2019-10-28 NOTE — Patient Instructions (Signed)
It was very nice to meet you today. Please enjoy the rest of your week. Today you were seen for knee injections today.   Please call the clinic at [phone] if your symptoms worsen or you have any concerns. It was our pleasure to serve you.

## 2019-10-29 NOTE — Assessment & Plan Note (Addendum)
PRE-OP DIAGNOSIS: Osteoarthritis of knee bilaterally POST-OP DIAGNOSIS: Same  PROCEDURE: joint injection Performing Physician: Dr. Salvadore Dom Supervising Physician (if applicable): Dr. Leveda Anna   Dose: Lidocaine 3  mL   Steroid 80mg /mL Depo-Medrol             Procedure: The area was prepped in the usual sterile manner. The needle was inserted into the affected area and the steroid was injected.  There were no complications during this procedure.   Followup: The patient tolerated the procedure well without complications.  Standard post-procedure care is explained and return precautions are given.

## 2019-11-09 ENCOUNTER — Telehealth: Payer: Self-pay | Admitting: Pharmacist

## 2019-11-09 DIAGNOSIS — F17200 Nicotine dependence, unspecified, uncomplicated: Secondary | ICD-10-CM

## 2019-11-09 NOTE — Telephone Encounter (Signed)
Patient follow-up phone call to assess progress with quitting smoking.    Patient did not answer after 4 rings.  However she called our office immediately and was routed to me.   She reports no smoking recently however she did report a death in the family which was difficulty.  The funeral was ~ 3 weeks ago and she reports that she did not smoking during that time.   She verbalized that she believes she is done with cigarettes.  She continues to take alprazolam 1mg  QID for her panic attacks but has not escalated during her quit attempts.   We agreed to follow-up by phone in two weeks and discuss Chantix reduction plan.

## 2019-11-09 NOTE — Telephone Encounter (Signed)
Noted and agree. 

## 2019-11-09 NOTE — Assessment & Plan Note (Signed)
She reports no smoking recently however she did report a death in the family which was difficulty.  The funeral was ~ 3 weeks ago and she reports that she did not smoking during that time.   She verbalized that she believes she is done with cigarettes.  She continues to take alprazolam 1mg  QID for her panic attacks but has not escalated during her quit attempts.   We agreed to follow-up by phone in two weeks and discuss Chantix reduction plan.

## 2019-11-16 DIAGNOSIS — M79671 Pain in right foot: Secondary | ICD-10-CM | POA: Diagnosis not present

## 2019-11-16 DIAGNOSIS — S91311A Laceration without foreign body, right foot, initial encounter: Secondary | ICD-10-CM | POA: Diagnosis not present

## 2019-11-18 DIAGNOSIS — M79671 Pain in right foot: Secondary | ICD-10-CM | POA: Diagnosis not present

## 2019-11-18 DIAGNOSIS — Z79899 Other long term (current) drug therapy: Secondary | ICD-10-CM | POA: Diagnosis not present

## 2019-11-18 DIAGNOSIS — S91311A Laceration without foreign body, right foot, initial encounter: Secondary | ICD-10-CM | POA: Diagnosis not present

## 2019-11-18 DIAGNOSIS — F419 Anxiety disorder, unspecified: Secondary | ICD-10-CM | POA: Diagnosis not present

## 2019-11-26 ENCOUNTER — Telehealth: Payer: Self-pay | Admitting: Pharmacist

## 2019-11-26 NOTE — Telephone Encounter (Signed)
Reviewed and agree.
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
## 2019-12-07 DIAGNOSIS — Z79899 Other long term (current) drug therapy: Secondary | ICD-10-CM | POA: Diagnosis not present

## 2019-12-07 DIAGNOSIS — M79671 Pain in right foot: Secondary | ICD-10-CM | POA: Diagnosis not present

## 2019-12-17 ENCOUNTER — Telehealth: Payer: Self-pay | Admitting: Pharmacist

## 2019-12-17 DIAGNOSIS — F17200 Nicotine dependence, unspecified, uncomplicated: Secondary | ICD-10-CM

## 2019-12-17 NOTE — Telephone Encounter (Signed)
Patient contacted for tobacco cessation follow-up.    Patient reports continued abstinence, unsure how long it has been but states she has not smoked at all since we last talked.  She is excited about going to beach next weekend to celebrate her 54th birthday on 10/4.   She has discontinued use of varenicline at this time.  I agreed with stopping this therapy.   She continues to work through Arts development officer about her living situation.  She requested more forms including the costs of visits and spirometry tests.  I again suggested that she make an appointment with her PCP, Dr. Clent Ridges to discuss all paperwork requests from our office.   She is willing to make that appointment.   I will plan to follow-up with her again in ~ 3 weeks by phone.  She accepted this plan.

## 2019-12-17 NOTE — Telephone Encounter (Signed)
Reviewed and agree.

## 2019-12-17 NOTE — Assessment & Plan Note (Signed)
Patient reports continued abstinence, unsure how long it has been but states she has not smoked at all since we last talked.  She is excited about going to beach next weekend to celebrate her 54th birthday on 10/4.   She has discontinued use of varenicline at this time.  I agreed with stopping this therapy.   She continues to work through Arts development officer about her living situation.  She requested more forms including the costs of visits and spirometry tests.  I again suggested that she make an appointment with her PCP, Dr. Clent Ridges to discuss all paperwork requests from our office.   She is willing to make that appointment.   I will plan to follow-up with her again in ~ 3 weeks by phone.  She accepted this plan.

## 2019-12-18 ENCOUNTER — Emergency Department (HOSPITAL_COMMUNITY): Payer: Medicaid Other

## 2019-12-18 ENCOUNTER — Emergency Department (HOSPITAL_COMMUNITY)
Admission: EM | Admit: 2019-12-18 | Discharge: 2019-12-18 | Disposition: A | Discharge status: Home / Self Care | Payer: Medicaid Other | Attending: Emergency Medicine | Admitting: Emergency Medicine

## 2019-12-18 ENCOUNTER — Other Ambulatory Visit: Payer: Self-pay

## 2019-12-18 DIAGNOSIS — R52 Pain, unspecified: Secondary | ICD-10-CM | POA: Diagnosis not present

## 2019-12-18 DIAGNOSIS — I10 Essential (primary) hypertension: Secondary | ICD-10-CM | POA: Diagnosis not present

## 2019-12-18 DIAGNOSIS — S8991XA Unspecified injury of right lower leg, initial encounter: Secondary | ICD-10-CM | POA: Diagnosis not present

## 2019-12-18 DIAGNOSIS — M47816 Spondylosis without myelopathy or radiculopathy, lumbar region: Secondary | ICD-10-CM | POA: Diagnosis not present

## 2019-12-18 DIAGNOSIS — M25561 Pain in right knee: Secondary | ICD-10-CM | POA: Diagnosis present

## 2019-12-18 DIAGNOSIS — Z87891 Personal history of nicotine dependence: Secondary | ICD-10-CM | POA: Diagnosis not present

## 2019-12-18 DIAGNOSIS — M4316 Spondylolisthesis, lumbar region: Secondary | ICD-10-CM | POA: Diagnosis not present

## 2019-12-18 DIAGNOSIS — M545 Low back pain: Secondary | ICD-10-CM | POA: Insufficient documentation

## 2019-12-18 DIAGNOSIS — M25461 Effusion, right knee: Secondary | ICD-10-CM | POA: Diagnosis not present

## 2019-12-18 DIAGNOSIS — M1711 Unilateral primary osteoarthritis, right knee: Secondary | ICD-10-CM | POA: Diagnosis not present

## 2019-12-18 DIAGNOSIS — M79671 Pain in right foot: Secondary | ICD-10-CM | POA: Diagnosis not present

## 2019-12-18 DIAGNOSIS — M19071 Primary osteoarthritis, right ankle and foot: Secondary | ICD-10-CM | POA: Diagnosis not present

## 2019-12-18 DIAGNOSIS — S3992XA Unspecified injury of lower back, initial encounter: Secondary | ICD-10-CM | POA: Diagnosis not present

## 2019-12-18 DIAGNOSIS — I700 Atherosclerosis of aorta: Secondary | ICD-10-CM | POA: Diagnosis not present

## 2019-12-18 DIAGNOSIS — S99921A Unspecified injury of right foot, initial encounter: Secondary | ICD-10-CM | POA: Diagnosis not present

## 2019-12-18 DIAGNOSIS — R609 Edema, unspecified: Secondary | ICD-10-CM | POA: Diagnosis not present

## 2019-12-18 MED ORDER — OXYCODONE HCL 5 MG PO TABS
5.0000 mg | ORAL_TABLET | Freq: Once | ORAL | Status: AC
  Administered 2019-12-18: 5 mg via ORAL
  Filled 2019-12-18: qty 1

## 2019-12-18 MED ORDER — ONDANSETRON 4 MG PO TBDP
4.0000 mg | ORAL_TABLET | Freq: Once | ORAL | Status: AC
  Administered 2019-12-18: 4 mg via ORAL
  Filled 2019-12-18: qty 1
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
## 2019-12-21 ENCOUNTER — Other Ambulatory Visit: Payer: Self-pay

## 2019-12-21 ENCOUNTER — Ambulatory Visit (INDEPENDENT_AMBULATORY_CARE_PROVIDER_SITE_OTHER): Payer: Medicaid Other | Admitting: Family Medicine

## 2019-12-21 VITALS — BP 136/84 | HR 86 | Ht 75.0 in

## 2019-12-21 DIAGNOSIS — M170 Bilateral primary osteoarthritis of knee: Secondary | ICD-10-CM | POA: Diagnosis not present

## 2019-12-21 NOTE — Patient Instructions (Addendum)
I have given you an Ace bandage that you can use for compression of the right knee.  Your body needs time to heal and unfortunately you may continue to feel pain for the next few days.  Your right knee may even take longer to heal since you already have osteoarthritis.  There is no medication that we can provide for the bruising, and the evidence for the bruising may continue even after the pain goes away.    For the knee, he should continue to Rest.  Ice.  Compress.  Elevate.  I have also sent you a referral to physical therapy you can perform physical therapy while you are waiting for your knee surgery.
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
## 2019-12-22 ENCOUNTER — Other Ambulatory Visit: Payer: Self-pay | Admitting: Family Medicine

## 2019-12-22 DIAGNOSIS — M25561 Pain in right knee: Secondary | ICD-10-CM

## 2019-12-22 DIAGNOSIS — M170 Bilateral primary osteoarthritis of knee: Secondary | ICD-10-CM

## 2019-12-23 NOTE — Telephone Encounter (Signed)
I did not see the patient to refill this medication.  Please forward to Dr Salvadore Dom as she seen the patient for this complaint. Thanks

## 2019-12-23 NOTE — Telephone Encounter (Signed)
Patient calls nurse line upset her pain medication was denied. Patient reports she was seen on 9/28 and was told to take Tylenol. Patient reports this is not helping. Please advise.

## 2019-12-23 NOTE — Telephone Encounter (Signed)
Patient returns call to nurse line regarding not receiving pain medication. Patient became very upset and states that tramadol is not a narcotic and she needs something to help with the pain.   Explained to patient below message. Patient verbalizes frustration and will follow up in clinic as necessary.   Veronda Prude, RN
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
## 2019-12-29 NOTE — Progress Notes (Deleted)
  Date of Visit: 12/30/2019   SUBJECTIVE:   HPI:  [name] presents today for ***  OBJECTIVE:   There were no vitals taken for this visit. Gen: *** HEENT: *** Heart: *** Lungs: *** Neuro: *** Ext: ***  ASSESSMENT/PLAN:   Health maintenance:  -***  No problem-specific Assessment & Plan notes found for this encounter.  FOLLOW UP: Follow up in *** for ***  [place] J. Pollie Meyer, MD [facility]

## 2019-12-30 ENCOUNTER — Ambulatory Visit: Payer: Medicaid Other | Admitting: Family Medicine

## 2019-12-31 ENCOUNTER — Ambulatory Visit (INDEPENDENT_AMBULATORY_CARE_PROVIDER_SITE_OTHER): Payer: Medicaid Other | Admitting: Family Medicine

## 2019-12-31 ENCOUNTER — Encounter: Payer: Self-pay | Admitting: Family Medicine

## 2019-12-31 ENCOUNTER — Other Ambulatory Visit: Payer: Self-pay

## 2019-12-31 VITALS — BP 145/90 | Ht 75.0 in | Wt 249.5 lb

## 2019-12-31 DIAGNOSIS — M1711 Unilateral primary osteoarthritis, right knee: Secondary | ICD-10-CM

## 2019-12-31 DIAGNOSIS — M25561 Pain in right knee: Secondary | ICD-10-CM

## 2019-12-31 DIAGNOSIS — G8929 Other chronic pain: Secondary | ICD-10-CM

## 2019-12-31 DIAGNOSIS — R030 Elevated blood-pressure reading, without diagnosis of hypertension: Secondary | ICD-10-CM

## 2019-12-31 MED ORDER — IBUPROFEN 400 MG PO TABS: 400.0000 mg | ORAL_TABLET | Freq: Three times a day (TID) | ORAL | 0 refills | Status: DC | PRN

## 2019-12-31 MED ORDER — DICLOFENAC SODIUM 1 % EX GEL: 4.0000 g | Freq: Four times a day (QID) | CUTANEOUS | 0 refills | Status: DC

## 2019-12-31 MED ORDER — TRAMADOL-ACETAMINOPHEN 37.5-325 MG PO TABS: ORAL_TABLET | ORAL | 0 refills | Status: AC
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
## 2019-12-31 NOTE — Patient Instructions (Signed)
Journal for Nurse Practitioners, 15(4), 263-267. Retrieved December 29, 2017 from http://clinicalkey.com/nursing">  Knee Exercises Ask your health care provider which exercises are safe for you. Do exercises exactly as told by your health care provider and adjust them as directed. It is normal to feel mild stretching, pulling, tightness, or discomfort as you do these exercises. Stop right away if you feel sudden pain or your pain gets worse. Do not begin these exercises until told by your health care provider. Stretching and range-of-motion exercises These exercises warm up your muscles and joints and improve the movement and flexibility of your knee. These exercises also help to relieve pain and swelling. Knee extension, prone 1. Lie on your abdomen (prone position) on a bed. 2. Place your left / right knee just beyond the edge of the surface so your knee is not on the bed. You can put a towel under your left / right thigh just above your kneecap for comfort. 3. Relax your leg muscles and allow gravity to straighten your knee (extension). You should feel a stretch behind your left / right knee. 4. Hold this position for __________ seconds. 5. Scoot up so your knee is supported between repetitions. Repeat __________ times. Complete this exercise __________ times a day. Knee flexion, active  1. Lie on your back with both legs straight. If this causes back discomfort, bend your left / right knee so your foot is flat on the floor. 2. Slowly slide your left / right heel back toward your buttocks. Stop when you feel a gentle stretch in the front of your knee or thigh (flexion). 3. Hold this position for __________ seconds. 4. Slowly slide your left / right heel back to the starting position. Repeat __________ times. Complete this exercise __________ times a day. Quadriceps stretch, prone  1. Lie on your abdomen on a firm surface, such as a bed or padded floor. 2. Bend your left / right knee and hold  your ankle. If you cannot reach your ankle or pant leg, loop a belt around your foot and grab the belt instead. 3. Gently pull your heel toward your buttocks. Your knee should not slide out to the side. You should feel a stretch in the front of your thigh and knee (quadriceps). 4. Hold this position for __________ seconds. Repeat __________ times. Complete this exercise __________ times a day. Hamstring, supine 1. Lie on your back (supine position). 2. Loop a belt or towel over the ball of your left / right foot. The ball of your foot is on the walking surface, right under your toes. 3. Straighten your left / right knee and slowly pull on the belt to raise your leg until you feel a gentle stretch behind your knee (hamstring). ? Do not let your knee bend while you do this. ? Keep your other leg flat on the floor. 4. Hold this position for __________ seconds. Repeat __________ times. Complete this exercise __________ times a day. Strengthening exercises These exercises build strength and endurance in your knee. Endurance is the ability to use your muscles for a long time, even after they get tired. Quadriceps, isometric This exercise stretches the muscles in front of your thigh (quadriceps) without moving your knee joint (isometric). 1. Lie on your back with your left / right leg extended and your other knee bent. Put a rolled towel or small pillow under your knee if told by your health care provider. 2. Slowly tense the muscles in the front of your left /   right thigh. You should see your kneecap slide up toward your hip or see increased dimpling just above the knee. This motion will push the back of the knee toward the floor. 3. For __________ seconds, hold the muscle as tight as you can without increasing your pain. 4. Relax the muscles slowly and completely. Repeat __________ times. Complete this exercise __________ times a day. Straight leg raises This exercise stretches the muscles in front  of your thigh (quadriceps) and the muscles that move your hips (hip flexors). 1. Lie on your back with your left / right leg extended and your other knee bent. 2. Tense the muscles in the front of your left / right thigh. You should see your kneecap slide up or see increased dimpling just above the knee. Your thigh may even shake a bit. 3. Keep these muscles tight as you raise your leg 4-6 inches (10-15 cm) off the floor. Do not let your knee bend. 4. Hold this position for __________ seconds. 5. Keep these muscles tense as you lower your leg. 6. Relax your muscles slowly and completely after each repetition. Repeat __________ times. Complete this exercise __________ times a day. Hamstring, isometric 1. Lie on your back on a firm surface. 2. Bend your left / right knee about __________ degrees. 3. Dig your left / right heel into the surface as if you are trying to pull it toward your buttocks. Tighten the muscles in the back of your thighs (hamstring) to "dig" as hard as you can without increasing any pain. 4. Hold this position for __________ seconds. 5. Release the tension gradually and allow your muscles to relax completely for __________ seconds after each repetition. Repeat __________ times. Complete this exercise __________ times a day. Hamstring curls If told by your health care provider, do this exercise while wearing ankle weights. Begin with __________ lb weights. Then increase the weight by 1 lb (0.5 kg) increments. Do not wear ankle weights that are more than __________ lb. 1. Lie on your abdomen with your legs straight. 2. Bend your left / right knee as far as you can without feeling pain. Keep your hips flat against the floor. 3. Hold this position for __________ seconds. 4. Slowly lower your leg to the starting position. Repeat __________ times. Complete this exercise __________ times a day. Squats This exercise strengthens the muscles in front of your thigh and knee  (quadriceps). 1. Stand in front of a table, with your feet and knees pointing straight ahead. You may rest your hands on the table for balance but not for support. 2. Slowly bend your knees and lower your hips like you are going to sit in a chair. ? Keep your weight over your heels, not over your toes. ? Keep your lower legs upright so they are parallel with the table legs. ? Do not let your hips go lower than your knees. ? Do not bend lower than told by your health care provider. ? If your knee pain increases, do not bend as low. 3. Hold the squat position for __________ seconds. 4. Slowly push with your legs to return to standing. Do not use your hands to pull yourself to standing. Repeat __________ times. Complete this exercise __________ times a day. Wall slides This exercise strengthens the muscles in front of your thigh and knee (quadriceps). 1. Lean your back against a smooth wall or door, and walk your feet out 18-24 inches (46-61 cm) from it. 2. Place your feet hip-width apart. 3.   Slowly slide down the wall or door until your knees bend __________ degrees. Keep your knees over your heels, not over your toes. Keep your knees in line with your hips. 4. Hold this position for __________ seconds. Repeat __________ times. Complete this exercise __________ times a day. Straight leg raises This exercise strengthens the muscles that rotate the leg at the hip and move it away from your body (hip abductors). 1. Lie on your side with your left / right leg in the top position. Lie so your head, shoulder, knee, and hip line up. You may bend your bottom knee to help you keep your balance. 2. Roll your hips slightly forward so your hips are stacked directly over each other and your left / right knee is facing forward. 3. Leading with your heel, lift your top leg 4-6 inches (10-15 cm). You should feel the muscles in your outer hip lifting. ? Do not let your foot drift forward. ? Do not let your knee  roll toward the ceiling. 4. Hold this position for __________ seconds. 5. Slowly return your leg to the starting position. 6. Let your muscles relax completely after each repetition. Repeat __________ times. Complete this exercise __________ times a day. Straight leg raises This exercise stretches the muscles that move your hips away from the front of the pelvis (hip extensors). 1. Lie on your abdomen on a firm surface. You can put a pillow under your hips if that is more comfortable. 2. Tense the muscles in your buttocks and lift your left / right leg about 4-6 inches (10-15 cm). Keep your knee straight as you lift your leg. 3. Hold this position for __________ seconds. 4. Slowly lower your leg to the starting position. 5. Let your leg relax completely after each repetition. Repeat __________ times. Complete this exercise __________ times a day. This information is not intended to replace advice given to you by your health care provider. Make sure you discuss any questions you have with your health care provider. Document Revised: 12/30/2017 Document Reviewed: 12/30/2017 Elsevier Patient Education  2020 [employer]  

## 2020-01-04 ENCOUNTER — Ambulatory Visit: Payer: Medicaid Other | Admitting: Family

## 2020-01-05 ENCOUNTER — Encounter: Payer: Self-pay | Admitting: Family

## 2020-01-05 ENCOUNTER — Ambulatory Visit (INDEPENDENT_AMBULATORY_CARE_PROVIDER_SITE_OTHER): Payer: Medicaid Other | Admitting: Family

## 2020-01-05 VITALS — Ht 75.0 in | Wt 249.0 lb

## 2020-01-05 DIAGNOSIS — M1711 Unilateral primary osteoarthritis, right knee: Secondary | ICD-10-CM

## 2020-01-05 DIAGNOSIS — S161XXA Strain of muscle, fascia and tendon at neck level, initial encounter: Secondary | ICD-10-CM | POA: Diagnosis not present

## 2020-01-05 DIAGNOSIS — M5441 Lumbago with sciatica, right side: Secondary | ICD-10-CM

## 2020-01-05 MED ORDER — PREDNISONE 50 MG PO TABS: ORAL_TABLET | ORAL | 0 refills | Status: DC

## 2020-01-05 MED ORDER — TIZANIDINE HCL 4 MG PO TABS: 4.0000 mg | ORAL_TABLET | Freq: Four times a day (QID) | ORAL | 0 refills | Status: DC | PRN
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
## 2020-01-07 ENCOUNTER — Encounter: Payer: Self-pay | Admitting: Physical Medicine & Rehabilitation

## 2020-01-07 ENCOUNTER — Telehealth: Payer: Self-pay

## 2020-01-07 NOTE — Telephone Encounter (Signed)
Noted  

## 2020-01-07 NOTE — Telephone Encounter (Signed)
-----   Message from Adonis Huguenin, NP sent at 01/05/2020  3:19 PM EDT ----- Will you order supplemental injection for right knee OA

## 2020-01-09 ENCOUNTER — Other Ambulatory Visit: Payer: Self-pay | Admitting: Family

## 2020-01-17 ENCOUNTER — Other Ambulatory Visit: Payer: Self-pay | Admitting: Family Medicine

## 2020-01-19 ENCOUNTER — Ambulatory Visit: Payer: Medicaid Other | Admitting: Physical Therapy

## 2020-01-20 ENCOUNTER — Telehealth: Payer: Self-pay

## 2020-01-20 DIAGNOSIS — M179 Osteoarthritis of knee, unspecified: Secondary | ICD-10-CM | POA: Diagnosis not present

## 2020-01-20 NOTE — Telephone Encounter (Signed)
Mailed [employer] application to patient's address listed for [employer], right knee. 

## 2020-01-21 ENCOUNTER — Encounter: Payer: Self-pay | Admitting: Physical Medicine & Rehabilitation

## 2020-01-21 ENCOUNTER — Encounter: Payer: Medicaid Other | Attending: Physical Medicine & Rehabilitation | Admitting: Physical Medicine & Rehabilitation

## 2020-01-21 ENCOUNTER — Other Ambulatory Visit: Payer: Self-pay

## 2020-01-21 VITALS — BP 121/79 | HR 82 | Temp 98.0°F | Ht 75.0 in | Wt 253.0 lb

## 2020-01-21 DIAGNOSIS — M5441 Lumbago with sciatica, right side: Secondary | ICD-10-CM | POA: Diagnosis not present

## 2020-01-21 DIAGNOSIS — M1711 Unilateral primary osteoarthritis, right knee: Secondary | ICD-10-CM | POA: Diagnosis not present

## 2020-01-21 MED ORDER — CYCLOBENZAPRINE HCL 10 MG PO TABS: 10.0000 mg | ORAL_TABLET | Freq: Every day | ORAL | 1 refills | Status: DC

## 2020-01-21 MED ORDER — GABAPENTIN 100 MG PO CAPS: 100.0000 mg | ORAL_CAPSULE | Freq: Three times a day (TID) | ORAL | 1 refills | Status: DC
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
## 2020-01-21 NOTE — Patient Instructions (Signed)
Sodium Hyaluronate intra-articular injection What is this medicine? SODIUM HYALURONATE (SOE dee um hye al yoor ON ate) is used to treat pain in the knee due to osteoarthritis. This medicine may be used for other purposes; ask your health care provider or pharmacist if you have questions. COMMON BRAND NAME(S): Amvisc, DUROLANE, Euflexxa, GELSYN-3, Hyalgan, Hymovis, Monovisc, Orthovisc, Supartz, Supartz FX, TriVisc, VISCO What should I tell my health care provider before I take this medicine? They need to know if you have any of these conditions:  bleeding disorders  glaucoma  infection in the knee joint  skin conditions or sensitivity  skin infection  an unusual allergic reaction to sodium hyaluronate, other medicines, foods, dyes, or preservatives. Different brands of sodium hyaluronate contain different allergens. Some may contain egg. Talk to your doctor about your allergies to make sure that you get the right product.  pregnant or trying to get pregnant  breast-feeding How should I use this medicine? This medicine is for injection into the knee joint. It is given by a health care professional in a hospital or clinic setting. Talk to your pediatrician regarding the use of this medicine in children. Special care may be needed. Overdosage: If you think you have taken too much of this medicine contact a poison control center or emergency room at once. NOTE: This medicine is only for you. Do not share this medicine with others. What if I miss a dose? This does not apply. What may interact with this medicine? Interactions are not expected. This list may not describe all possible interactions. Give your health care provider a list of all the medicines, herbs, non-prescription drugs, or dietary supplements you use. Also tell them if you smoke, drink alcohol, or use illegal drugs. Some items may interact with your medicine. What should I watch for while using this medicine? Tell your  doctor or healthcare professional if your symptoms do not start to get better or if they get worse. If receiving this medicine for osteoarthritis, limit your activity after you receive your injection. Avoid physical activity for 48 hours following your injection to keep your knee from swelling. Do not stand on your feet for more than 1 hour at a time during the first 48 hours following your injection. Ask your doctor or healthcare professional about when you can begin major physical activity again. What side effects may I notice from receiving this medicine? Side effects that you should report to your doctor or health care professional as soon as possible:  allergic reactions like skin rash, itching or hives, swelling of the face, lips, or tongue  dizziness  facial flushing  pain, tingling, numbness in the hands or feet  vision changes if received this medicine during eye surgery Side effects that usually do not require medical attention (report to your doctor or health care professional if they continue or are bothersome):  back pain  bruising at site where injected  chills  diarrhea  fever  headache  joint pain  joint stiffness  joint swelling  muscle cramps  muscle pain  nausea, vomiting  pain, redness, or irritation at site where injected  weak or tired This list may not describe all possible side effects. Call your doctor for medical advice about side effects. You may report side effects to FDA at 1-800-FDA-1088. Where should I keep my medicine? This drug is given in a hospital or clinic and will not be stored at home. NOTE: This sheet is a summary. It may not cover all   possible information. If you have questions about this medicine, talk to your doctor, pharmacist, or health care provider.  2020 Elsevier/Gold Standard (2015-04-13 08:34:51)  

## 2020-01-25 DIAGNOSIS — F3181 Bipolar II disorder: Secondary | ICD-10-CM | POA: Diagnosis not present

## 2020-01-25 DIAGNOSIS — Z79891 Long term (current) use of opiate analgesic: Secondary | ICD-10-CM | POA: Diagnosis not present

## 2020-01-27 ENCOUNTER — Other Ambulatory Visit: Payer: Self-pay | Admitting: Family Medicine

## 2020-01-27 NOTE — Telephone Encounter (Signed)
I have not seen this patient for GI concerns. Please have patient make an appointment or send to provider who prescribed medication.  Thanks  [name]

## 2020-01-30 NOTE — Progress Notes (Deleted)
    SUBJECTIVE:   CHIEF COMPLAINT / HPI: f/u BP  ***  PERTINENT  PMH / PSH: ***  OBJECTIVE:   There were no vitals taken for this visit.  ***  ASSESSMENT/PLAN:   No problem-specific Assessment & Plan notes found for this encounter.     Dana Allan, MD [facility]

## 2020-02-02 ENCOUNTER — Ambulatory Visit: Payer: Medicaid Other | Admitting: Family Medicine

## 2020-02-03 ENCOUNTER — Ambulatory Visit: Payer: Medicaid Other | Attending: Family Medicine | Admitting: Physical Therapy

## 2020-02-03 ENCOUNTER — Other Ambulatory Visit: Payer: Self-pay

## 2020-02-03 ENCOUNTER — Encounter: Payer: Self-pay | Admitting: Physical Therapy

## 2020-02-03 DIAGNOSIS — M6281 Muscle weakness (generalized): Secondary | ICD-10-CM | POA: Diagnosis present

## 2020-02-03 DIAGNOSIS — M25561 Pain in right knee: Secondary | ICD-10-CM | POA: Diagnosis present

## 2020-02-03 DIAGNOSIS — M170 Bilateral primary osteoarthritis of knee: Secondary | ICD-10-CM | POA: Diagnosis not present

## 2020-02-03 DIAGNOSIS — M25661 Stiffness of right knee, not elsewhere classified: Secondary | ICD-10-CM | POA: Diagnosis present

## 2020-02-03 DIAGNOSIS — G8929 Other chronic pain: Secondary | ICD-10-CM | POA: Diagnosis present

## 2020-02-03 DIAGNOSIS — M25662 Stiffness of left knee, not elsewhere classified: Secondary | ICD-10-CM | POA: Insufficient documentation

## 2020-02-03 DIAGNOSIS — R262 Difficulty in walking, not elsewhere classified: Secondary | ICD-10-CM | POA: Diagnosis present

## 2020-02-03 DIAGNOSIS — M25562 Pain in left knee: Secondary | ICD-10-CM | POA: Insufficient documentation
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
## 2020-02-04 ENCOUNTER — Ambulatory Visit (INDEPENDENT_AMBULATORY_CARE_PROVIDER_SITE_OTHER): Payer: Medicaid Other | Admitting: Family Medicine

## 2020-02-04 ENCOUNTER — Other Ambulatory Visit: Payer: Self-pay

## 2020-02-04 VITALS — BP 132/78 | HR 73 | Ht 75.0 in | Wt 251.2 lb

## 2020-02-04 DIAGNOSIS — M170 Bilateral primary osteoarthritis of knee: Secondary | ICD-10-CM | POA: Diagnosis not present

## 2020-02-04 MED ORDER — METHYLPREDNISOLONE ACETATE 80 MG/ML IJ SUSP
80.0000 mg | Freq: Once | INTRAMUSCULAR | Status: AC
  Administered 2020-02-04: 80 mg via INTRAMUSCULAR

## 2020-02-04 NOTE — Patient Instructions (Signed)
It was nice to see you today,  We performed steroid injections of your knees.  As we discussed there is always a small risk of bleeding or infection.  If you concerned about any of these he can come back to our clinic for further evaluation.  Please continue to follow with your orthopedist for further chronic pain management.  Please schedule an appointment with your PCP at your earliest convenience.  Have a great day,  Frederic Jericho, MD

## 2020-02-05 NOTE — Assessment & Plan Note (Addendum)
Patient insists on having her knee injections performed at our clinic. Last injection was 3 months ago. She has hyaluronic knee injections scheduled with her orthopedist in December.    PROCEDURE: INJECTION: Patient was given informed consent, signed copy in the chart. Appropriate time out was taken. Area prepped and draped in usual sterile fashion.  A 25 gauge 1 1/2 inch needle was used.. 1 cc of methylprednisolone 80 mg/ml plus  3 cc of 1% lidocaine without epinephrine was injected into medial joint space using a(n) anteromedial approach. Procedure was repeated for both knees.   The patient tolerated the procedure well. There were no complications. Post procedure instructions were given.  Procedure was supervised by Dr. Leveda Anna

## 2020-02-07 ENCOUNTER — Other Ambulatory Visit: Payer: Self-pay | Admitting: Family Medicine

## 2020-02-22 ENCOUNTER — Ambulatory Visit: Payer: Medicaid Other

## 2020-02-24 ENCOUNTER — Encounter: Payer: Self-pay | Admitting: Physical Medicine & Rehabilitation

## 2020-02-24 ENCOUNTER — Encounter: Payer: Medicaid Other | Attending: Physical Medicine & Rehabilitation | Admitting: Physical Medicine & Rehabilitation

## 2020-02-24 ENCOUNTER — Other Ambulatory Visit: Payer: Self-pay

## 2020-02-24 VITALS — BP 133/84 | HR 84 | Temp 98.4°F | Ht 75.0 in | Wt 253.0 lb

## 2020-02-24 DIAGNOSIS — M5441 Lumbago with sciatica, right side: Secondary | ICD-10-CM | POA: Diagnosis not present

## 2020-02-24 DIAGNOSIS — G8929 Other chronic pain: Secondary | ICD-10-CM | POA: Insufficient documentation

## 2020-02-24 DIAGNOSIS — M1711 Unilateral primary osteoarthritis, right knee: Secondary | ICD-10-CM | POA: Insufficient documentation

## 2020-02-24 MED ORDER — DIAZEPAM 10 MG PO TABS: ORAL_TABLET | ORAL | 0 refills | Status: DC

## 2020-02-24 NOTE — Progress Notes (Signed)
Indication end-stage osteoarthritis of the knee with pain that limits mobility and does not respond to oral medications.  Ultrasound guidance, 15 Hz linear transducer, long axis view  Medial aspect of the knee was imaged, identified joint space, identified patella, femur, tibia. 25-gauge 1.5 inch needle was inserted under ultrasound guidance and 3 mL of 1% lidocaine were infiltrated into the skin and subcutaneous tissue. Then a 21-gauge, 2 inch needle was inserted along the same needle track Into the joint under direct ultrasound visualization.  6 mL of Synvisc-1 were injected. Patient tolerated procedure well Post procedure instructions given  Continues complaint of low back pain which has been going on greater than 6 weeks.  Pain is persisted despite prescription medication management.  She has pain that goes down the back of her right thigh into the right calf area.  As noted in prior visit plan is to order MRI to evaluate

## 2020-02-26 DIAGNOSIS — E559 Vitamin D deficiency, unspecified: Secondary | ICD-10-CM | POA: Diagnosis not present

## 2020-02-26 DIAGNOSIS — Z79899 Other long term (current) drug therapy: Secondary | ICD-10-CM | POA: Diagnosis not present

## 2020-02-26 DIAGNOSIS — M129 Arthropathy, unspecified: Secondary | ICD-10-CM | POA: Diagnosis not present

## 2020-02-26 DIAGNOSIS — Z1159 Encounter for screening for other viral diseases: Secondary | ICD-10-CM | POA: Diagnosis not present

## 2020-02-28 ENCOUNTER — Ambulatory Visit: Payer: Medicaid Other | Attending: Family Medicine

## 2020-02-29 ENCOUNTER — Telehealth: Payer: Self-pay | Admitting: *Deleted

## 2020-02-29 NOTE — Telephone Encounter (Signed)
Swelling may occur after Synvisc injections especially of the 3 in 1 shot.  This should subside over the next couple weeks.  I would recommend icing 3-4 times per day for 20 minutes each time.  Also continue to flex the knee and extend for 15-20 repetitions, 3-4 times a day after icing

## 2020-02-29 NOTE — Telephone Encounter (Signed)
I have given her the information.
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
## 2020-03-02 ENCOUNTER — Ambulatory Visit: Payer: Medicaid Other | Admitting: Physical Therapy

## 2020-03-03 ENCOUNTER — Ambulatory Visit (INDEPENDENT_AMBULATORY_CARE_PROVIDER_SITE_OTHER): Payer: Medicaid Other | Admitting: Student in an Organized Health Care Education/Training Program

## 2020-03-03 ENCOUNTER — Other Ambulatory Visit: Payer: Self-pay

## 2020-03-03 VITALS — BP 130/70 | HR 71 | Ht 71.0 in | Wt 253.6 lb

## 2020-03-03 DIAGNOSIS — I8390 Asymptomatic varicose veins of unspecified lower extremity: Secondary | ICD-10-CM | POA: Diagnosis not present

## 2020-03-03 DIAGNOSIS — Z5181 Encounter for therapeutic drug level monitoring: Secondary | ICD-10-CM | POA: Diagnosis not present

## 2020-03-03 DIAGNOSIS — Z1159 Encounter for screening for other viral diseases: Secondary | ICD-10-CM | POA: Diagnosis not present

## 2020-03-03 NOTE — Patient Instructions (Addendum)
It was a pleasure to see you today!  To summarize our discussion for this visit: Your MRI is scheduled for 03/20/2020 at 12:30 PM  at  [facility] AT [address]  Today we are going to check your kidney function and prescribe a medication for nerve pain- gabapentin.  Please return to your PCP and ortho if you need anything else. I think that physical therapy could be very beneficial for your recovery.  Some additional health maintenance measures we should update are: Health Maintenance Due  Topic Date Due  . Hepatitis C Screening  Never done  . COVID-19 Vaccine (1) Never done  . HIV Screening  Never done  . TETANUS/TDAP  Never done  . MAMMOGRAM  12/27/2015  . COLONOSCOPY  Never done  . INFLUENZA VACCINE  Never done  .    Call the clinic at [phone] if your symptoms worsen or you have any concerns.   Thank you for allowing me to take part in your care,  Dr. Jamelle Rushing
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
## 2020-03-04 LAB — BASIC METABOLIC PANEL
BUN/Creatinine Ratio: 20 (ref 9–23)
BUN: 20 mg/dL (ref 6–24)
CO2: 23 mmol/L (ref 20–29)
Calcium: 9.5 mg/dL (ref 8.7–10.2)
Chloride: 104 mmol/L (ref 96–106)
Creatinine, Ser: 0.99 mg/dL (ref 0.57–1.00)
GFR calc Af Amer: 75 mL/min/{1.73_m2} (ref >59)
GFR calc non Af Amer: 65 mL/min/{1.73_m2} (ref >59)
Glucose: 106 mg/dL — ABNORMAL HIGH (ref 65–99)
Potassium: 4.5 mmol/L (ref 3.5–5.2)
Sodium: 142 mmol/L (ref 134–144)

## 2020-03-04 LAB — HEPATITIS C ANTIBODY: Hep C Virus Ab: <0.1 s/co ratio (ref 0.0–0.9)

## 2020-03-05 MED ORDER — GABAPENTIN 300 MG PO CAPS: 300.0000 mg | ORAL_CAPSULE | Freq: Three times a day (TID) | ORAL | 0 refills | Status: DC

## 2020-03-05 NOTE — Assessment & Plan Note (Signed)
Informed patient of her MRI scheduled in her AVS.  Discussed starting gabapentin. No recent blood work for renal function. Was normal 5 years ago - BMP, hep C ordered this visit - will start gabapentin 300mg  [facility]

## 2020-03-05 NOTE — Assessment & Plan Note (Signed)
Recommended compression stockings

## 2020-03-06 ENCOUNTER — Ambulatory Visit: Payer: Medicaid Other

## 2020-03-08 ENCOUNTER — Other Ambulatory Visit: Payer: Self-pay | Admitting: *Deleted

## 2020-03-08 ENCOUNTER — Other Ambulatory Visit: Payer: Self-pay | Admitting: Physical Medicine & Rehabilitation

## 2020-03-09 ENCOUNTER — Encounter: Payer: Medicaid Other | Admitting: Physical Therapy

## 2020-03-09 NOTE — Telephone Encounter (Signed)
Patient needs to make an appointment before refill.  Dana Allan, MD [facility]

## 2020-03-10 NOTE — Telephone Encounter (Signed)
Tried to contact pt to let them know that they need an appointment before refill and VM was full so unable to LVM.  If pt calls back please assist her in getting this appointment scheduled.Corban Kistler Zimmerman Rumple, CMA

## 2020-03-16 ENCOUNTER — Encounter: Payer: Medicaid Other | Admitting: Physical Therapy

## 2020-03-20 ENCOUNTER — Ambulatory Visit
Admission: RE | Admit: 2020-03-20 | Discharge: 2020-03-20 | Disposition: A | Discharge status: Home / Self Care | Payer: Medicaid Other | Source: Ambulatory Visit | Attending: Physical Medicine & Rehabilitation | Admitting: Physical Medicine & Rehabilitation

## 2020-03-20 ENCOUNTER — Other Ambulatory Visit: Payer: Self-pay | Admitting: Physical Medicine & Rehabilitation

## 2020-03-20 ENCOUNTER — Other Ambulatory Visit: Payer: Self-pay

## 2020-03-20 DIAGNOSIS — G8929 Other chronic pain: Secondary | ICD-10-CM

## 2020-03-20 DIAGNOSIS — M5441 Lumbago with sciatica, right side: Secondary | ICD-10-CM

## 2020-03-28 ENCOUNTER — Other Ambulatory Visit: Payer: Self-pay

## 2020-03-29 MED ORDER — PANTOPRAZOLE SODIUM 40 MG PO TBEC
40.0000 mg | DELAYED_RELEASE_TABLET | Freq: Every day | ORAL | 2 refills | Status: DC
Start: 2020-03-29 — End: 2020-07-06

## 2020-03-30 ENCOUNTER — Telehealth: Payer: Self-pay | Admitting: Pharmacist
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
## 2020-03-31 ENCOUNTER — Encounter: Payer: Self-pay | Admitting: Physical Medicine & Rehabilitation

## 2020-03-31 ENCOUNTER — Encounter: Payer: Medicaid Other | Attending: Physical Medicine & Rehabilitation | Admitting: Physical Medicine & Rehabilitation

## 2020-03-31 ENCOUNTER — Telehealth: Payer: Self-pay | Admitting: *Deleted

## 2020-03-31 ENCOUNTER — Other Ambulatory Visit: Payer: Self-pay

## 2020-03-31 VITALS — BP 145/85 | HR 78 | Temp 98.1°F | Ht 71.0 in | Wt 257.0 lb

## 2020-03-31 DIAGNOSIS — M1711 Unilateral primary osteoarthritis, right knee: Secondary | ICD-10-CM | POA: Insufficient documentation

## 2020-03-31 DIAGNOSIS — G8929 Other chronic pain: Secondary | ICD-10-CM

## 2020-03-31 DIAGNOSIS — M5441 Lumbago with sciatica, right side: Secondary | ICD-10-CM | POA: Diagnosis not present

## 2020-03-31 NOTE — Patient Instructions (Signed)
Use heat at a time  Use muscle relaxer at night  Back Exercises These exercises help to make your trunk and back strong. They also help to keep the lower back flexible. Doing these exercises can help to prevent back pain or lessen existing pain.  If you have back pain, try to do these exercises 2-3 times each day or as told by your doctor.  As you get better, do the exercises once each day. Repeat the exercises more often as told by your doctor.  To stop back pain from coming back, do the exercises once each day, or as told by your doctor. Exercises Single knee to chest Do these steps 3-5 times in a row for each leg: 1. Lie on your back on a firm bed or the floor with your legs stretched out. 2. Bring one knee to your chest. 3. Grab your knee or thigh with both hands and hold them it in place. 4. Pull on your knee until you feel a gentle stretch in your lower back or buttocks. 5. Keep doing the stretch for 10-30 seconds. 6. Slowly let go of your leg and straighten it. Pelvic tilt Do these steps 5-10 times in a row: 1. Lie on your back on a firm bed or the floor with your legs stretched out. 2. Bend your knees so they point up to the ceiling. Your feet should be flat on the floor. 3. Tighten your lower belly (abdomen) muscles to press your lower back against the floor. This will make your tailbone point up to the ceiling instead of pointing down to your feet or the floor. 4. Stay in this position for 5-10 seconds while you gently tighten your muscles and breathe evenly. Cat-cow Do these steps until your lower back bends more easily: 1. Get on your hands and knees on a firm surface. Keep your hands under your shoulders, and keep your knees under your hips. You may put padding under your knees. 2. Let your head hang down toward your chest. Tighten (contract) the muscles in your belly. Point your tailbone toward the floor so your lower back becomes rounded like the back of a  cat. 3. Stay in this position for 5 seconds. 4. Slowly lift your head. Let the muscles of your belly relax. Point your tailbone up toward the ceiling so your back forms a sagging arch like the back of a cow. 5. Stay in this position for 5 seconds.  Press-ups Do these steps 5-10 times in a row: 1. Lie on your belly (face-down) on the floor. 2. Place your hands near your head, about shoulder-width apart. 3. While you keep your back relaxed and keep your hips on the floor, slowly straighten your arms to raise the top half of your body and lift your shoulders. Do not use your back muscles. You may change where you place your hands in order to make yourself more comfortable. 4. Stay in this position for 5 seconds. 5. Slowly return to lying flat on the floor.  Bridges Do these steps 10 times in a row: 1. Lie on your back on a firm surface. 2. Bend your knees so they point up to the ceiling. Your feet should be flat on the floor. Your arms should be flat at your sides, next to your body. 3. Tighten your butt muscles and lift your butt off the floor until your waist is almost as high as your knees. If you do not feel the muscles working in  your butt and the back of your thighs, slide your feet 1-2 inches farther away from your butt. 4. Stay in this position for 3-5 seconds. 5. Slowly lower your butt to the floor, and let your butt muscles relax. If this exercise is too easy, try doing it with your arms crossed over your chest. Belly crunches Do these steps 5-10 times in a row: 1. Lie on your back on a firm bed or the floor with your legs stretched out. 2. Bend your knees so they point up to the ceiling. Your feet should be flat on the floor. 3. Cross your arms over your chest. 4. Tip your chin a little bit toward your chest but do not bend your neck. 5. Tighten your belly muscles and slowly raise your chest just enough to lift your shoulder blades a tiny bit off of the floor. Avoid raising your  body higher than that, because it can put too much stress on your low back. 6. Slowly lower your chest and your head to the floor. Back lifts Do these steps 5-10 times in a row: 1. Lie on your belly (face-down) with your arms at your sides, and rest your forehead on the floor. 2. Tighten the muscles in your legs and your butt. 3. Slowly lift your chest off of the floor while you keep your hips on the floor. Keep the back of your head in line with the curve in your back. Look at the floor while you do this. 4. Stay in this position for 3-5 seconds. 5. Slowly lower your chest and your face to the floor. Contact a doctor if:  Your back pain gets a lot worse when you do an exercise.  Your back pain does not get better 2 hours after you exercise. If you have any of these problems, stop doing the exercises. Do not do them again unless your doctor says it is okay. Get help right away if:  You have sudden, very bad back pain. If this happens, stop doing the exercises. Do not do them again unless your doctor says it is okay. This information is not intended to replace advice given to you by your health care provider. Make sure you discuss any questions you have with your health care provider. Document Revised: 12/04/2017 Document Reviewed: 12/04/2017 Elsevier Patient Education  2020 [employer].
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
## 2020-03-31 NOTE — Telephone Encounter (Signed)
-----   Message from Erick Colace, MD sent at 03/31/2020  1:10 PM EST ----- This patient has a current order for lumbar MRI.  This was attempted under oral sedation with Valium.  She could not tolerate the exam due to claustrophobia.  The patient requires conscious sedation for this and will need to have this done at [facility].  Could you please go into the order and change it to [facility] and also check IV sedation. Thanks

## 2020-04-04 ENCOUNTER — Ambulatory Visit (INDEPENDENT_AMBULATORY_CARE_PROVIDER_SITE_OTHER): Payer: Medicaid Other | Admitting: Family Medicine

## 2020-04-04 ENCOUNTER — Other Ambulatory Visit: Payer: Self-pay

## 2020-04-04 VITALS — BP 130/80 | HR 112 | Ht 75.0 in | Wt 253.8 lb

## 2020-04-04 DIAGNOSIS — Z0000 Encounter for general adult medical examination without abnormal findings: Secondary | ICD-10-CM

## 2020-04-04 DIAGNOSIS — G8921 Chronic pain due to trauma: Secondary | ICD-10-CM | POA: Diagnosis not present

## 2020-04-04 DIAGNOSIS — Z122 Encounter for screening for malignant neoplasm of respiratory organs: Secondary | ICD-10-CM

## 2020-04-04 DIAGNOSIS — Z1211 Encounter for screening for malignant neoplasm of colon: Secondary | ICD-10-CM | POA: Diagnosis not present

## 2020-04-04 LAB — POCT GLYCOSYLATED HEMOGLOBIN (HGB A1C): Hemoglobin A1C: 5.9 % — AB (ref 4.0–5.6)

## 2020-04-04 MED ORDER — NAPROXEN SODIUM 550 MG PO TABS: 550.0000 mg | ORAL_TABLET | Freq: Two times a day (BID) | ORAL | 0 refills | Status: DC

## 2020-04-04 MED ORDER — ACETAMINOPHEN ER 650 MG PO TBCR: 1300.0000 mg | EXTENDED_RELEASE_TABLET | Freq: Three times a day (TID) | ORAL | 0 refills | Status: AC

## 2020-04-04 NOTE — Patient Instructions (Addendum)
Thank you for coming to see me today. It was a pleasure.   You are due for a colonoscopy. Referral sent today  I encourage you to get your COVID vaccine  We will get some labs today.  If they are abnormal or we need to do something about them, I will call you.  If they are normal, I will send you a message on MyChart (if it is active) or a letter in the mail.  If you don't hear from [place] in 2 weeks, please call the office at the number below.   Tylenol 1300 mg three times a day Naproxen 550 mg twice a day with meals Start Gabapentin 300 mg three times a day  Do not take Ibuprofen  Follow up with your Sports medicine doctor to schedule your MRI  Please follow-up with me in 2 weeks   If you have any questions or concerns, please do not hesitate to call the office at [phone].  Best,   Dana Allan, MD
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
## 2020-04-05 LAB — LIPID PANEL
Chol/HDL Ratio: 4.7 ratio — ABNORMAL HIGH (ref 0.0–4.4)
Cholesterol, Total: 319 mg/dL — ABNORMAL HIGH (ref 100–199)
HDL: 68 mg/dL (ref >39)
LDL Chol Calc (NIH): 212 mg/dL — ABNORMAL HIGH (ref 0–99)
Triglycerides: 204 mg/dL — ABNORMAL HIGH (ref 0–149)
VLDL Cholesterol Cal: 39 mg/dL (ref 5–40)

## 2020-04-07 ENCOUNTER — Telehealth: Payer: Self-pay | Admitting: *Deleted

## 2020-04-07 NOTE — Telephone Encounter (Signed)
Contacted pt to inform her of her upcoming appointment for a ct and she also wanted to see her primary in 2 weeks so I scheduled that visit and she wanted to be seen soon to discuss falls and that was scheduled as well.  She said that the pharmacy said that Medicaid did not pay for the medications that were sent into the pharmacy and would like something they cover to be sent in, also she needs a refill on her tramadol she said.  Routing to PCP.[name], CMA

## 2020-04-08 ENCOUNTER — Other Ambulatory Visit: Payer: Self-pay | Admitting: Family Medicine

## 2020-04-08 ENCOUNTER — Encounter: Payer: Self-pay | Admitting: Family Medicine

## 2020-04-08 DIAGNOSIS — Z0000 Encounter for general adult medical examination without abnormal findings: Secondary | ICD-10-CM | POA: Insufficient documentation

## 2020-04-08 DIAGNOSIS — G8921 Chronic pain due to trauma: Secondary | ICD-10-CM | POA: Insufficient documentation

## 2020-04-08 MED ORDER — NAPROXEN 500 MG PO TABS: 500.0000 mg | ORAL_TABLET | Freq: Two times a day (BID) | ORAL | 0 refills | Status: DC

## 2020-04-08 MED ORDER — METHOCARBAMOL 750 MG PO TABS: 750.0000 mg | ORAL_TABLET | Freq: Four times a day (QID) | ORAL | 0 refills | Status: DC

## 2020-04-08 MED ORDER — METHOCARBAMOL 500 MG PO TABS: 500.0000 mg | ORAL_TABLET | Freq: Four times a day (QID) | ORAL | 0 refills | Status: AC

## 2020-04-08 MED ORDER — TRAZODONE HCL 50 MG PO TABS: 25.0000 mg | ORAL_TABLET | Freq: Every evening | ORAL | 0 refills | Status: DC | PRN

## 2020-04-08 NOTE — Progress Notes (Signed)
err

## 2020-04-08 NOTE — Assessment & Plan Note (Signed)
Offered PT referral, patient declined. Continue exercises per SM Await MRI Consider NS referral pending MRI results Tylenol 1300 mg TID x 14/7 Naproxen DS 550 mg BID x 14/7 Robaxin 750 q4h prn x 14/7 D/C Flexeril Continue Voltaren gel prn Follow up in 2-3 weeks

## 2020-04-08 NOTE — Addendum Note (Signed)
Addended by: Enid Cutter on: 04/08/2020 12:54 PM   Modules accepted: Orders

## 2020-04-08 NOTE — Assessment & Plan Note (Signed)
Covid vaccine: Declined Flu vaccine: Declined Colonoscopy: Order today Pap: 06/21 due 2024 Mammogram:2009, declined today Low dose CT scan: Ordered today  HbA1c, Lipid panel today

## 2020-04-11 ENCOUNTER — Other Ambulatory Visit: Payer: Self-pay

## 2020-04-11 ENCOUNTER — Ambulatory Visit (INDEPENDENT_AMBULATORY_CARE_PROVIDER_SITE_OTHER): Payer: Medicaid Other | Admitting: Family Medicine

## 2020-04-11 VITALS — Ht 71.0 in

## 2020-04-11 DIAGNOSIS — Z5329 Procedure and treatment not carried out because of patient's decision for other reasons: Secondary | ICD-10-CM

## 2020-04-11 DIAGNOSIS — Z91199 Patient's noncompliance with other medical treatment and regimen due to unspecified reason: Secondary | ICD-10-CM

## 2020-04-13 ENCOUNTER — Telehealth: Payer: Self-pay | Admitting: *Deleted

## 2020-04-13 ENCOUNTER — Telehealth: Payer: Self-pay | Admitting: Pharmacist

## 2020-04-13 DIAGNOSIS — G8929 Other chronic pain: Secondary | ICD-10-CM

## 2020-04-13 NOTE — Telephone Encounter (Signed)
Contacted patient RE tobacco cessation / intake reduction efforts.   Patient reports smoking 2 cigarettes per day at times of high stress.   She continues to work on efforts to move out of her "moldy apartment".   We agreed on a goal of staying at 0-1  cigs per day as a goal BUT also to avoid smoking > 2 per day.   We agreed on phone follow-up in 2-3 weeks.

## 2020-04-13 NOTE — Telephone Encounter (Signed)
Okay to order shower chair 

## 2020-04-13 NOTE — Telephone Encounter (Signed)
Patient care coordinator called asking where we are going to send the DME order for a shower chair to.   I contacted the patient and asked if she had a conversation with Dr. Wynn Banker about a shower chair.  She said no.  I informed the patient that I would send a message to Dr. Wynn Banker that the patient is requesting an order be sent for a shower chair.

## 2020-04-13 NOTE — Telephone Encounter (Signed)
When I spoke to the patient on the phone she asked would Dr. Wynn Banker please order me a shower chair. This item has not been ordered yet by any provider

## 2020-04-13 NOTE — Telephone Encounter (Signed)
I did not order this perhaps it was another doctor

## 2020-04-13 NOTE — Telephone Encounter (Signed)
-----   Message from Kathrin Ruddy, RPH-CPP sent at 03/30/2020  2:09 PM EST ----- Regarding: Tobacco Use follow-up

## 2020-04-14 ENCOUNTER — Telehealth: Payer: Self-pay | Admitting: *Deleted

## 2020-04-14 NOTE — Telephone Encounter (Signed)
Received fax requesting refill on pts tramadol, did not see on current med list.Gianfranco Araki [employer], CMA

## 2020-04-18 DIAGNOSIS — F3181 Bipolar II disorder: Secondary | ICD-10-CM | POA: Diagnosis not present

## 2020-04-18 NOTE — Telephone Encounter (Signed)
Order placed, faxed to [facility]

## 2020-04-19 ENCOUNTER — Ambulatory Visit (HOSPITAL_COMMUNITY): Payer: Medicaid Other

## 2020-04-19 ENCOUNTER — Other Ambulatory Visit: Payer: Self-pay | Admitting: Family Medicine

## 2020-04-19 NOTE — Telephone Encounter (Signed)
[name] from [employer] said they need documentation to support the need for a shower chair.  She is asking for a clinic note that states the need for the shower chair (last clinic note would need an addendum). She is asking for corresponding Dx code and Ht/wt.  Once addendum is complete, clinic note, height/weight, and diagnostic code can be faxed to [phone]
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
## 2020-04-20 NOTE — Patient Instructions (Addendum)
Thank you for coming to see me today. It was a pleasure.   Follow up with Sports medicine doctor regarding your MRI  Follow up with forms and have them sent to the clinic for me to complete for personal care at home  Increase Gabapentin to 900 mg three times a day.  Do not take more than this in a 24 hour period  Start Crestor 10 mg daily  PAP smear due in 2024  Appointment for COVID vaccination on 2/21 at 210pm  Please follow-up with PCP as needed  If you have any questions or concerns, please do not hesitate to call the office at [phone].  Best,   Dana Allan, MD

## 2020-04-21 ENCOUNTER — Ambulatory Visit (INDEPENDENT_AMBULATORY_CARE_PROVIDER_SITE_OTHER): Payer: Medicaid Other | Admitting: Family Medicine

## 2020-04-21 ENCOUNTER — Other Ambulatory Visit: Payer: Self-pay

## 2020-04-21 DIAGNOSIS — E785 Hyperlipidemia, unspecified: Secondary | ICD-10-CM | POA: Diagnosis not present

## 2020-04-21 DIAGNOSIS — G8921 Chronic pain due to trauma: Secondary | ICD-10-CM | POA: Diagnosis not present

## 2020-04-21 MED ORDER — GABAPENTIN 300 MG PO CAPS: 900.0000 mg | ORAL_CAPSULE | Freq: Three times a day (TID) | ORAL | 0 refills | Status: DC

## 2020-04-21 MED ORDER — ROSUVASTATIN CALCIUM 10 MG PO TABS: 10.0000 mg | ORAL_TABLET | Freq: Every day | ORAL | 3 refills | Status: DC

## 2020-04-23 ENCOUNTER — Encounter: Payer: Self-pay | Admitting: Family Medicine

## 2020-04-23 DIAGNOSIS — E785 Hyperlipidemia, unspecified: Secondary | ICD-10-CM | POA: Insufficient documentation

## 2020-04-23 NOTE — Assessment & Plan Note (Signed)
Current smoker and sedentary lifestyle.  LDL 212 -Discussed with patient starting statin therapy to decrease risk of stroke and patient would like to continue with medical management -Will start Crestor 10 mg daily -Repeat lipids 6/12 -Encourage smoking cessation -Follow-up as needed

## 2020-04-23 NOTE — Assessment & Plan Note (Addendum)
Chronic right-sided pain.  Has slightly improved with naproxen, Robaxin and gabapentin. -Follow-up with sports medicine 02/4.  My understanding she is going to have an MRI of her lumbar spine under conscious sedation.  I'm not sure this is process has started and I have asked her to discuss this with her sports medicine physician. -Consider neurosurgery consult once MRI has been completed. -We'll increase gabapentin to 900 mg 3 times daily -Continue Naproxen 500 mg twice daily -Continue Robaxin 4 times daily as needed -If pain not managed with this regime will refer to pain management clinic. -Patient will recheck to home agency and have forms sent to me so I can complete them for home aide assistance. -Follow-up as needed

## 2020-04-28 ENCOUNTER — Other Ambulatory Visit: Payer: Self-pay

## 2020-04-28 ENCOUNTER — Encounter: Payer: Self-pay | Admitting: Physical Medicine & Rehabilitation

## 2020-04-28 ENCOUNTER — Encounter: Payer: Medicaid Other | Attending: Physical Medicine & Rehabilitation | Admitting: Physical Medicine & Rehabilitation

## 2020-04-28 VITALS — BP 131/80 | Ht 75.0 in | Wt 260.0 lb

## 2020-04-28 DIAGNOSIS — M5441 Lumbago with sciatica, right side: Secondary | ICD-10-CM | POA: Diagnosis not present

## 2020-04-28 DIAGNOSIS — G8929 Other chronic pain: Secondary | ICD-10-CM | POA: Insufficient documentation

## 2020-04-28 NOTE — Addendum Note (Signed)
Addended by: Erick Colace on: 04/28/2020 12:06 PM   Modules accepted: Level of Service
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
## 2020-05-01 ENCOUNTER — Other Ambulatory Visit: Payer: Self-pay | Admitting: Family Medicine

## 2020-05-01 ENCOUNTER — Telehealth: Payer: Self-pay

## 2020-05-01 NOTE — Telephone Encounter (Signed)
Patient has been seeing Dr. Jodean Lima who is a pain management provider.  Jone Baseman, CMA

## 2020-05-01 NOTE — Telephone Encounter (Signed)
Patient calls nurse line upset she is still in pain. Patient would like to be referred to pain management as discussed with PCP. I looked back and she was referred to pain management in October 2021. Patient reports she never heard anything from anyone about this and is requesting a new one. Will forward to [place] and PCP.

## 2020-05-05 ENCOUNTER — Other Ambulatory Visit: Payer: Self-pay | Admitting: Family Medicine

## 2020-05-09 ENCOUNTER — Telehealth: Payer: Self-pay | Admitting: Pharmacist

## 2020-05-09 NOTE — Telephone Encounter (Signed)
Attempted phone follow-up of tobacco reduction/cessation.   Left message.  I plan to call again in the next 2-3 days.

## 2020-05-09 NOTE — Telephone Encounter (Signed)
-----   Message from Kathrin Ruddy, RPH-CPP sent at 04/13/2020  2:55 PM EST ----- Regarding: tobacco cessation or intake reduction Smoking 2 per day

## 2020-05-10 NOTE — Telephone Encounter (Signed)
Noted and agree. 

## 2020-05-12 ENCOUNTER — Telehealth: Payer: Self-pay | Admitting: Pharmacist

## 2020-05-12 DIAGNOSIS — F17200 Nicotine dependence, unspecified, uncomplicated: Secondary | ICD-10-CM
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
## 2020-05-12 NOTE — Telephone Encounter (Signed)
Noted and agree. 
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
## 2020-05-15 ENCOUNTER — Ambulatory Visit: Payer: Medicaid Other | Admitting: Family Medicine

## 2020-05-17 ENCOUNTER — Telehealth: Payer: Self-pay | Admitting: *Deleted

## 2020-05-17 NOTE — Telephone Encounter (Signed)
Receive voicemail from [place] with [employer].  Patient reached to them in an attempt to get changes made to her personal care services.  She will need a new referral faxed to [facility] at [phone].  Their phone number is [phone] and address: [address] [place]. [address].    Will forward to MD to place this order.  Marisella Puccio,CMA

## 2020-05-17 NOTE — Progress Notes (Signed)
   Subjective:   Patient ID: [name]    DOB: 05/30/1965, 54 y.o. female   MRN: [id number]  [name] is a 55 y.o. female  here for COVID vaccine.  HPI: Here for first COVID vaccination. No acute concerns. Denies history of egg allergy or anaphylaxis/reaction to prior vaccinations.  Review of Systems:  Per HPI.   Objective:   Pulse 81   Wt 264 lb (119.7 kg)   SpO2 99%   BMI 33.00 kg/m  Vitals and nursing note reviewed.  General: pleasant older woman, sitting comfortably in exam chair, well nourished, well developed, in no acute distress with non-toxic appearance Resp: breathing comfortably on room air, speaking in full sentences Neuro: Alert and oriented, speech normal  Assessment & Plan:   COVID Vaccination: First COVID vaccines administered today. Patient tolerated well with no complications. Follow up scheduled for 2nd dose  Orpah Cobb, DO PGY-3, [facility] 05/18/2020 10:34 AM

## 2020-05-18 ENCOUNTER — Ambulatory Visit (INDEPENDENT_AMBULATORY_CARE_PROVIDER_SITE_OTHER): Payer: Medicaid Other

## 2020-05-18 ENCOUNTER — Ambulatory Visit (INDEPENDENT_AMBULATORY_CARE_PROVIDER_SITE_OTHER): Payer: Medicaid Other | Admitting: Family Medicine

## 2020-05-18 ENCOUNTER — Other Ambulatory Visit: Payer: Self-pay

## 2020-05-18 VITALS — HR 81 | Wt 264.0 lb

## 2020-05-18 DIAGNOSIS — Z23 Encounter for immunization: Secondary | ICD-10-CM

## 2020-05-19 ENCOUNTER — Other Ambulatory Visit: Payer: Self-pay | Admitting: Family Medicine

## 2020-05-23 ENCOUNTER — Other Ambulatory Visit: Payer: Self-pay | Admitting: Family Medicine

## 2020-05-23 NOTE — Telephone Encounter (Signed)
Is this a home health referral?

## 2020-05-24 ENCOUNTER — Ambulatory Visit
Admission: RE | Admit: 2020-05-24 | Discharge: 2020-05-24 | Disposition: A | Discharge status: Home / Self Care | Payer: Medicaid Other | Source: Ambulatory Visit | Attending: Physical Medicine & Rehabilitation | Admitting: Physical Medicine & Rehabilitation

## 2020-05-24 ENCOUNTER — Telehealth: Payer: Self-pay | Admitting: Pharmacist

## 2020-05-24 DIAGNOSIS — M5441 Lumbago with sciatica, right side: Secondary | ICD-10-CM

## 2020-05-24 DIAGNOSIS — G8929 Other chronic pain: Secondary | ICD-10-CM

## 2020-05-24 DIAGNOSIS — M545 Low back pain, unspecified: Secondary | ICD-10-CM | POA: Diagnosis not present

## 2020-05-24 NOTE — Telephone Encounter (Signed)
Clinical staff portion completed and provided to MD to complete.  Librado Guandique,CMA

## 2020-05-24 NOTE — Telephone Encounter (Signed)
I don't have a form.  That would be most helpful.  Thank you

## 2020-05-24 NOTE — Telephone Encounter (Signed)
Follow-up call for patient who has quit in the past and was abstinent for 3 days at last contact.   Left brief message.  I will try again.

## 2020-05-24 NOTE — Telephone Encounter (Signed)
No it is a request for personal care services.  On the form is a place to put a specific agency and caretaker. I will try and complete the form via online and send it to you to print and complete, if this helps.  Rebbecca Osuna,CMA

## 2020-05-26 ENCOUNTER — Other Ambulatory Visit: Payer: Self-pay

## 2020-05-26 ENCOUNTER — Encounter: Payer: Medicaid Other | Admitting: Physical Medicine & Rehabilitation

## 2020-05-26 ENCOUNTER — Encounter: Payer: Self-pay | Admitting: Physical Medicine & Rehabilitation

## 2020-05-26 NOTE — Progress Notes (Deleted)
Subjective:    Patient ID: [name], female    DOB: 05-Mar-1966, 55 y.o.   MRN: [id number]  HPI  Pain Inventory Average Pain 10 Pain Right Now 10 My pain is intermittent and sharp  In the last 24 hours, has pain interfered with the following? General activity 10 Relation with others 10 Enjoyment of life 10 What TIME of day is your pain at its worst? morning , evening and night Sleep (in general) NA  Pain is worse with: walking, bending, standing and some activites Pain improves with: rest and heat/ice Relief from Meds: no medication  Family History  Problem Relation Age of Onset  . Hypertension Mother   . Diabetes Mother   . Diabetes Father   . Hypertension Father    Social History   Socioeconomic History  . Marital status: Married    Spouse name: Not on file  . Number of children: Not on file  . Years of education: Not on file  . Highest education level: Not on file  Occupational History  . Not on file  Tobacco Use  . Smoking status: Current Some Day Smoker    Packs/day: 0.50    Years: 36.00    Pack years: 18.00    Types: Cigarettes    Start date: 03/25/1981    Last attempt to quit: 10/07/2019    Years since quitting: 0.6  . Smokeless tobacco: Never Used  Vaping Use  . Vaping Use: Never used  Substance and Sexual Activity  . Alcohol use: Yes  . Drug use: Yes    Types: Marijuana  . Sexual activity: Yes    Birth control/protection: None  Other Topics Concern  . Not on file  Social History Narrative   3 children (2 daughters and 1 son). Engaged, not working she is disabled since 2009 (anxiety, Bipolar). Finished 11th grade.    Social Determinants of Health   Financial Resource Strain: Not on file  Food Insecurity: Not on file  Transportation Needs: Not on file  Physical Activity: Not on file  Stress: Not on file  Social Connections: Not on file   Past Surgical History:  Procedure Laterality Date  . KNEE CARTILAGE SURGERY  2013   right     Past Surgical History:  Procedure Laterality Date  . KNEE CARTILAGE SURGERY  2013   right    Past Medical History:  Diagnosis Date  . Anxiety   . Arthritis   . Bipolar 1 disorder (HCC)   . 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 Depression   . ECZEMA, ATOPIC DERMATITIS 08/25/2007   Qualifier: Diagnosis of  By: Seleta Rhymes MD, Loraine Leriche    . GERD (gastroesophageal reflux disease)   . LOW BACK PAIN, CHRONIC 06/30/2006   Qualifier: Diagnosis of  By: Seleta Rhymes MD, Loraine Leriche    . OVARIAN CYST 08/12/2007   Qualifier: Diagnosis of  By: Seleta Rhymes MD, Loraine Leriche    . Panic attacks   . 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 TOBACCO ABUSE 04/13/2008  . VARICOSE VEINS, LOWER EXTREMITIES 06/30/2006   Qualifier: Diagnosis of  By: Seleta Rhymes MD, Mark     BP 131/80 Comment: last recorded  Pulse 81 Comment: last recorded  Ht 6\' 3"  (1.905 m)   Wt 264 lb (119.7 kg) Comment: last recorded  BMI 33.00 kg/m   Opioid Risk Score:   Fall Risk Score:  `1  Depression  screen PHQ 2/9  Depression screen [facility] 2/9 05/26/2020 04/28/2020 04/21/2020 03/03/2020 02/04/2020 01/21/2020 12/31/2019  Decreased Interest 3 1 3 3 3  0 1  Down, Depressed, Hopeless 3 1 3 3 3 1 1   PHQ - 2 Score 6 2 6 6 6 1 2   Altered sleeping - - 3 3 3 1 1   Tired, decreased energy - - 3 3 3 1 1   Change in appetite - - 0 3 3 1 1   Feeling bad or failure about yourself  - - 3 1 3  0 1  Trouble concentrating - - 1 0 0 0 0  Moving slowly or fidgety/restless - - 3 0 0 0 1  Suicidal thoughts - - 0 0 0 0 0  PHQ-9 Score - - 19 16 18 4 7   Some recent data might be hidden    Review of Systems  Constitutional: Negative.   HENT: Negative.   Eyes: Negative.   Respiratory: Negative.   Cardiovascular: Negative.   Gastrointestinal: Negative.   Genitourinary: Negative.   Musculoskeletal: Positive for gait problem.       Leg hurts from back of knee all the way up to her neck  Skin: Negative.    Allergic/Immunologic: Negative.   Hematological: Negative.   Psychiatric/Behavioral: Positive for dysphoric mood.  All other systems reviewed and are negative.      Objective:   Physical Exam        Assessment & Plan:

## 2020-06-01 ENCOUNTER — Telehealth: Payer: Self-pay | Admitting: Pharmacist

## 2020-06-01 DIAGNOSIS — F17200 Nicotine dependence, unspecified, uncomplicated: Secondary | ICD-10-CM

## 2020-06-01 NOTE — Telephone Encounter (Signed)
Phone call to patient RE tobacco cessation.    Patient reports NO smoking any tobacco product for 3 weeks.  She denies use of any tobacco cessation product (medication).  She admits to smoking 3 blunts of "weed" daily.    I encouraged her to continue with abstinence from tobacco and suggested that any vapor or foreign substance could harm her lungs which she knows are functioning at a lower level, based on spirometry. We discussed the goal of avoiding oxygen therapy (she admits her mother was on oxygen and she would like to avoid using oxygen).   We also discussed using nasal saline to assist with decreasing deposition of foreign particles including allergens, molds and smoke vapor.  She verbalized understanding that all of these respiratory irritants should be minimized to help her lung function.   I agreed to follow-up in 2-3 weeks to reassess tobacco abstinence.

## 2020-06-01 NOTE — Assessment & Plan Note (Signed)
Patient reports NO smoking any tobacco product for 3 weeks.  She denies use of any tobacco cessation product (medication).  She admits to smoking 3 blunts of "weed" daily.    I encouraged her to continue with abstinence from tobacco and suggested that any vapor or foreign substance could harm her lungs which she knows are functioning at a lower level, based on spirometry. We discussed the goal of avoiding oxygen therapy (she admits her mother was on oxygen and she would like to avoid using oxygen).   We also discussed using nasal saline to assist with decreasing deposition of foreign particles including allergens, molds and smoke vapor.  She verbalized understanding that all of these respiratory irritants should be minimized to help her lung function.

## 2020-06-01 NOTE — Telephone Encounter (Signed)
Noted and agree. 

## 2020-06-01 NOTE — Telephone Encounter (Signed)
-----   Message from Kathrin Ruddy, RPH-CPP sent at 05/12/2020 11:49 AM EST ----- Regarding: Quit Smoking ~ 3 weeks (off all treatment for tobacco cessation)

## 2020-06-02 ENCOUNTER — Other Ambulatory Visit: Payer: Self-pay

## 2020-06-02 ENCOUNTER — Encounter: Payer: Self-pay | Admitting: Physical Medicine & Rehabilitation

## 2020-06-02 ENCOUNTER — Encounter: Payer: Medicaid Other | Attending: Physical Medicine & Rehabilitation | Admitting: Physical Medicine & Rehabilitation

## 2020-06-02 VITALS — BP 155/94 | HR 65 | Temp 98.8°F | Ht 75.0 in | Wt 267.4 lb

## 2020-06-02 DIAGNOSIS — M47816 Spondylosis without myelopathy or radiculopathy, lumbar region: Secondary | ICD-10-CM | POA: Insufficient documentation

## 2020-06-02 MED ORDER — DIAZEPAM 10 MG PO TABS: ORAL_TABLET | ORAL | 0 refills | Status: DC
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
## 2020-06-02 NOTE — Patient Instructions (Signed)
CT lumbar showing arthritis of L4-5

## 2020-06-04 NOTE — Telephone Encounter (Signed)
Hi Deborah, I have signed the forms and placed them in your mailbox.  Please review and let me know if there is anything else needed.  Thank you  [name]

## 2020-06-06 ENCOUNTER — Ambulatory Visit: Payer: Self-pay | Admitting: Licensed Clinical Social Worker

## 2020-06-06 NOTE — Chronic Care Management (AMB) (Signed)
  Care Management  Consultation Note  06/06/2020 Name: JAYDIN JALOMO MRN: [id number] DOB: Feb 21, 1966  SUNDI SLEVIN is a 55 y.o. year old female who is a primary care patient of Dana Allan, MD. The CCM team was Consultation reference care coordination needs for completing a personal care service form.  Intervention: Patient was not interviewed or contacted during this encounter.   CCM LCSW collaborated with PCP via [facility] .  Conducted brief assessment, recommendations and relevant information discussed via message to provider. Follow up Plan: If further intervention is needed the care management team is available to follow up after a formal CCM referral is placed     Collaboration with Dana Allan, MD regarding development and update of comprehensive plan of care as evidenced by provider attestation and co-signature Review of patient past medical history, allergies, medications, and health status, including review of pertinent consultant reports was performed as part of comprehensive evaluation and provision of care management/care coordination services.   Care Plan Conditions to be addressed/monitored per PCP order: Level of care concerns  There are no care plans to display for this patient.    Soundra Pilon, LCSW

## 2020-06-07 ENCOUNTER — Other Ambulatory Visit: Payer: Self-pay | Admitting: Physical Medicine & Rehabilitation

## 2020-06-07 ENCOUNTER — Other Ambulatory Visit: Payer: Self-pay | Admitting: Family Medicine

## 2020-06-07 NOTE — Telephone Encounter (Signed)
Patient [name] on nurse line requesting Tramadol and Flexeril. I do not see them on her updated medication list. Will forward to PCP.

## 2020-06-07 NOTE — Telephone Encounter (Signed)
Fax received from [employer] stating that since patient has a managed medicaid plan there is a different form that needs to be completed.  All previous information copied onto new form and placed in Dr. Claris Che box for signature.  Please place on my desk so I can fax it.  Thanks [employer]

## 2020-06-08 NOTE — Telephone Encounter (Signed)
Please let patient know that she will need to make an appointment to be seen in clinic or follow up with her pain management doctor.  Thank you   [name]

## 2020-06-09 NOTE — Telephone Encounter (Signed)
Contacted pt and informed her of below and she said that you are supposed to be referring her to pain management and also she wanted to know if you had completed forms/referral for her home care. She has been falling she said. She has appointment scheduled for 06/30/2020 with PCP. Analeigh Aries Zimmerman Rumple, CMA

## 2020-06-11 ENCOUNTER — Other Ambulatory Visit: Payer: Self-pay | Admitting: Family Medicine

## 2020-06-11 NOTE — Telephone Encounter (Signed)
Placed on  your desk Thanks [name]

## 2020-06-12 NOTE — Telephone Encounter (Signed)
Form faxed to [facility] [phone].  Izear Pine,CMA

## 2020-06-22 ENCOUNTER — Telehealth: Payer: Self-pay | Admitting: Pharmacist

## 2020-06-22 ENCOUNTER — Ambulatory Visit: Payer: Medicaid Other

## 2020-06-22 DIAGNOSIS — F17200 Nicotine dependence, unspecified, uncomplicated: Secondary | ICD-10-CM

## 2020-06-22 NOTE — Telephone Encounter (Signed)
Noted and agree. 
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
## 2020-06-22 NOTE — Progress Notes (Deleted)
    SUBJECTIVE:   CHIEF COMPLAINT / HPI:   Wants knee injections.  I gave them to her in November.  May have had hyaluronic acid injection in December.    PERTINENT  PMH / PSH: ***  OBJECTIVE:   There were no vitals taken for this visit.  ***  ASSESSMENT/PLAN:   No problem-specific Assessment & Plan notes found for this encounter.     Sandre Kitty, MD [facility]   {    This will disappear when note is signed, click to select method of visit    :1}

## 2020-06-22 NOTE — Telephone Encounter (Signed)
-----   Message from Kathrin Ruddy, RPH-CPP sent at 06/01/2020  3:31 PM EST ----- Regarding: Tobacco Cessation Maintenence.  Quit ~05/14/2020

## 2020-06-23 NOTE — Progress Notes (Signed)
This encounter was canceled by the patient due to transportation issues and unable to connect to internet.

## 2020-06-27 ENCOUNTER — Other Ambulatory Visit: Payer: Self-pay

## 2020-06-27 ENCOUNTER — Encounter: Payer: Self-pay | Admitting: Physical Medicine & Rehabilitation

## 2020-06-27 ENCOUNTER — Encounter: Payer: Medicaid Other | Attending: Physical Medicine & Rehabilitation | Admitting: Physical Medicine & Rehabilitation

## 2020-06-27 VITALS — BP 182/107 | HR 92 | Temp 98.8°F | Ht 75.0 in | Wt 267.0 lb

## 2020-06-27 DIAGNOSIS — M1711 Unilateral primary osteoarthritis, right knee: Secondary | ICD-10-CM | POA: Insufficient documentation

## 2020-06-27 DIAGNOSIS — M47816 Spondylosis without myelopathy or radiculopathy, lumbar region: Secondary | ICD-10-CM | POA: Diagnosis not present

## 2020-06-27 MED ORDER — CYCLOBENZAPRINE HCL 10 MG PO TABS: 10.0000 mg | ORAL_TABLET | Freq: Every day | ORAL | 5 refills | Status: DC

## 2020-06-27 NOTE — Progress Notes (Signed)
Subjective:    Patient ID: [name], female    DOB: [birth year], 55 y.o.   MRN: [id number]  HPI Patient returns today indicates that cyclobenzaprine has been helpful for her low back pain.  She is resting a little bit better at night.  The right knee pain is doing well after Synvisc injection performed under ultrasound guidance on 02/24/2020.  We discussed that injection can be repeated after 6 months if pain reoccurs.  Pain Inventory Average Pain 8 Pain Right Now 8 My pain is intermittent and stabbing  In the last 24 hours, has pain interfered with the following? General activity 10 Relation with others 5 Enjoyment of life 10 What TIME of day is your pain at its worst? morning  and night Sleep (in general) Good  Pain is worse with: walking, standing and some activites Pain improves with: heat/ice and medication Relief from Meds: 7  Family History  Problem Relation Age of Onset  . Hypertension Mother   . Diabetes Mother   . Diabetes Father   . Hypertension Father    Social History   Socioeconomic History  . Marital status: Married    Spouse name: Not on file  . Number of children: Not on file  . Years of education: Not on file  . Highest education level: Not on file  Occupational History  . Not on file  Tobacco Use  . Smoking status: Current Some Day Smoker    Packs/day: 0.50    Years: 36.00    Pack years: 18.00    Types: Cigarettes    Start date: 03/25/1981    Last attempt to quit: 10/07/2019    Years since quitting: 0.7  . Smokeless tobacco: Never Used  Vaping Use  . Vaping Use: Never used  Substance and Sexual Activity  . Alcohol use: Yes  . Drug use: Yes    Types: Marijuana  . Sexual activity: Yes    Birth control/protection: None  Other Topics Concern  . Not on file  Social History Narrative   3 children (2 daughters and 1 son). Engaged, not working she is disabled since 2009 (anxiety, Bipolar). Finished 11th grade.    Social Determinants of  Health   Financial Resource Strain: Not on file  Food Insecurity: Not on file  Transportation Needs: Not on file  Physical Activity: Not on file  Stress: Not on file  Social Connections: Not on file   Past Surgical History:  Procedure Laterality Date  . KNEE CARTILAGE SURGERY  2013   right    Past Surgical History:  Procedure Laterality Date  . KNEE CARTILAGE SURGERY  2013   right    Past Medical History:  Diagnosis Date  . Anxiety   . Arthritis   . Bipolar 1 disorder (HCC)   . 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 Depression   . ECZEMA, ATOPIC DERMATITIS 08/25/2007   Qualifier: Diagnosis of  By: Seleta Rhymes MD, Loraine Leriche    . GERD (gastroesophageal reflux disease)   . LOW BACK PAIN, CHRONIC 06/30/2006   Qualifier: Diagnosis of  By: Seleta Rhymes MD, Loraine Leriche    . OVARIAN CYST 08/12/2007   Qualifier: Diagnosis of  By: Seleta Rhymes MD, Loraine Leriche    . Panic attacks   . 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 TOBACCO ABUSE 04/13/2008  . VARICOSE VEINS, LOWER EXTREMITIES 06/30/2006   Qualifier: Diagnosis of  By:  Rowand MD, Mark     BP (!) 182/107   Pulse 92   Temp 98.8 F (37.1 C)   Ht 6\' 3"  (1.905 m)   Wt 267 lb (121.1 kg)   SpO2 96%   BMI 33.37 kg/m   Opioid Risk Score:   Fall Risk Score:  `1  Depression screen PHQ 2/9  Depression screen [facility] 2/9 06/02/2020 05/26/2020 04/28/2020 04/21/2020 03/03/2020 02/04/2020 01/21/2020  Decreased Interest 3 3 1 3 3 3  0  Down, Depressed, Hopeless 3 3 1 3 3 3 1   PHQ - 2 Score 6 6 2 6 6 6 1   Altered sleeping - - - 3 3 3 1   Tired, decreased energy - - - 3 3 3 1   Change in appetite - - - 0 3 3 1   Feeling bad or failure about yourself  - - - 3 1 3  0  Trouble concentrating - - - 1 0 0 0  Moving slowly or fidgety/restless - - - 3 0 0 0  Suicidal thoughts - - - 0 0 0 0  PHQ-9 Score - - - 19 16 18 4   Some recent data might be hidden    Review of Systems  Constitutional: Negative.    HENT: Negative.   Eyes: Negative.   Respiratory: Negative.   Cardiovascular: Negative.   Gastrointestinal: Negative.   Endocrine: Negative.   Genitourinary: Negative.   Musculoskeletal: Positive for back pain.  Skin: Negative.   Allergic/Immunologic: Negative.   Neurological: Negative.   Hematological: Negative.   Psychiatric/Behavioral: Negative.   All other systems reviewed and are negative.      Objective:   Physical Exam Vitals and nursing note reviewed.  Constitutional:      Appearance: She is obese.  HENT:     Head: Normocephalic and atraumatic.  Eyes:     Extraocular Movements: Extraocular movements intact.     Conjunctiva/sclera: Conjunctivae normal.     Pupils: Pupils are equal, round, and reactive to light.  Musculoskeletal:     Comments: Has normal lumbar flexion extension is limited to 75% lateral bending is to 75% twisting is 100% Mild tenderness to palpation in the lumbar paraspinal areas.  Skin:    General: Skin is warm and dry.  Neurological:     Mental Status: She is alert and oriented to person, place, and time.     Comments: Negative straight leg raising test Motor strength is 5/5 bilateral hip flexor knee extensor ankle dorsiflexors.  Psychiatric:        Mood and Affect: Mood normal.           Assessment & Plan:  #1.  Chronic lumbar pain lumbar spondylosis, patient was scheduled for lumbar medial branch blocks but did not feel like she can tolerate these injections without conscious sedation.  At this time she seems fairly comfortable just taking cyclobenzaprine 10 mg at night.  We went over some additional exercises she can try.  If her pain becomes more difficult to manage, may consider referral for medial branch blocks under conscious sedation. 2.  Right knee osteoarthritis approximately 4 months post Synvisc injection at earliest she can repeat injections in about 2 months.  Patient can call if her pain recurs around that time Physical  medicine rehab clinic follow-up in 6 months

## 2020-06-27 NOTE — Patient Instructions (Signed)
Please call if you need another knee gel  injection

## 2020-06-30 ENCOUNTER — Ambulatory Visit: Payer: Medicaid Other | Admitting: Family Medicine

## 2020-06-30 ENCOUNTER — Other Ambulatory Visit: Payer: Self-pay

## 2020-06-30 ENCOUNTER — Ambulatory Visit (INDEPENDENT_AMBULATORY_CARE_PROVIDER_SITE_OTHER): Payer: Medicaid Other

## 2020-06-30 ENCOUNTER — Ambulatory Visit (INDEPENDENT_AMBULATORY_CARE_PROVIDER_SITE_OTHER): Payer: Medicaid Other | Admitting: Family Medicine

## 2020-06-30 VITALS — BP 150/90 | HR 80 | Ht 75.0 in | Wt 266.4 lb

## 2020-06-30 DIAGNOSIS — Z23 Encounter for immunization: Secondary | ICD-10-CM

## 2020-06-30 DIAGNOSIS — M25569 Pain in unspecified knee: Secondary | ICD-10-CM | POA: Diagnosis not present

## 2020-06-30 MED ORDER — METHYLPREDNISOLONE ACETATE 80 MG/ML IJ SUSP
40.0000 mg | Freq: Once | INTRAMUSCULAR | Status: AC
  Administered 2020-06-30: 40 mg via INTRAMUSCULAR

## 2020-06-30 MED ORDER — METHYLPREDNISOLONE ACETATE 80 MG/ML IJ SUSP: 80.0000 mg | Freq: Once | INTRAMUSCULAR | Status: DC

## 2020-06-30 MED ORDER — METHYLPREDNISOLONE ACETATE 80 MG/ML IJ SUSP
40.0000 mg | Freq: Once | INTRAMUSCULAR | Status: AC
Start: 2020-06-30 — End: 2020-06-30
  Administered 2020-06-30: 40 mg via INTRAMUSCULAR

## 2020-06-30 NOTE — Progress Notes (Signed)
    SUBJECTIVE:  CHIEF COMPLAINT / HPI:   Patient presenting today for bilateral knee injections.  She has a past medical history significant for chronic knee pain secondary to knee osteo arthritis most recent imaging for her right knee was in September 2021 showing moderate to advanced tricompartmental degenerative osteoarthrosis that had worsened from previous image.  No recent left knee radiographs available.  She recently had Synvisc injections in December 2021 at [facility].  Her last steroid injection was in November 2021.  Patient reports that she is in severe pain in both knees and is overall not doing well.  PERTINENT  PMH / PSH: chronic back pain, chronic knee pain   OBJECTIVE:  BP (!) 150/90   Pulse 80   Ht 6\' 3"  (1.905 m)   Wt 266 lb 6 oz (120.8 kg)   SpO2 99%   BMI 33.29 kg/m   Knee:  Inspection - mild swelling, no erythema Palpation - No warmth. TTP of joint line bilaterally. No TTP of bony prominences. No palpable fluid collection bilaterally. ROM - Full active & passive ROM.  Normal gait.   Knee Injection: Written and verbal consent was obtained after discussing the risks and benefits of the procedure with the patient. The right anterior knee was cleansed in a sterile fashion with betadine. 40 mg Depo-medrol and 3 cc 1% Lidocaine was injected using an anterolateral approach using a 5 cc syringe and 25 gauge 11/2 in needle. The same was repeated on the left knee.  No complications were encountered. Minimal blood loss. A band aid was applied.   ASSESSMENT/PLAN:  Patient presenting for bilateral knee injections. See procedure notes.  Patient tolerated injections well.   Patient requesting flexeril refill. Dr. fills this for her. Recommended she reach out to his office as she see them for chronic back pain.   Wynn Banker, MD [facility]

## 2020-07-04 DIAGNOSIS — M5441 Lumbago with sciatica, right side: Secondary | ICD-10-CM | POA: Diagnosis not present

## 2020-07-05 DIAGNOSIS — M5441 Lumbago with sciatica, right side: Secondary | ICD-10-CM | POA: Diagnosis not present

## 2020-07-06 ENCOUNTER — Other Ambulatory Visit: Payer: Self-pay

## 2020-07-06 DIAGNOSIS — M5441 Lumbago with sciatica, right side: Secondary | ICD-10-CM | POA: Diagnosis not present

## 2020-07-06 MED ORDER — PANTOPRAZOLE SODIUM 40 MG PO TBEC: 40.0000 mg | DELAYED_RELEASE_TABLET | Freq: Every day | ORAL | 2 refills | Status: DC

## 2020-07-07 ENCOUNTER — Other Ambulatory Visit: Payer: Self-pay | Admitting: Family Medicine

## 2020-07-10 DIAGNOSIS — Z79891 Long term (current) use of opiate analgesic: Secondary | ICD-10-CM | POA: Diagnosis not present

## 2020-07-11 DIAGNOSIS — F3181 Bipolar II disorder: Secondary | ICD-10-CM | POA: Diagnosis not present

## 2020-07-12 DIAGNOSIS — M5441 Lumbago with sciatica, right side: Secondary | ICD-10-CM | POA: Diagnosis not present

## 2020-08-28 ENCOUNTER — Other Ambulatory Visit: Payer: Self-pay

## 2020-08-28 ENCOUNTER — Encounter: Payer: Self-pay | Admitting: Family Medicine

## 2020-08-28 ENCOUNTER — Ambulatory Visit (INDEPENDENT_AMBULATORY_CARE_PROVIDER_SITE_OTHER): Payer: Medicaid Other | Admitting: Family Medicine

## 2020-08-28 VITALS — BP 140/103 | HR 72 | Ht 75.0 in | Wt 258.0 lb

## 2020-08-28 DIAGNOSIS — K047 Periapical abscess without sinus: Secondary | ICD-10-CM

## 2020-08-28 MED ORDER — AMOXICILLIN 500 MG PO CAPS: 500.0000 mg | ORAL_CAPSULE | Freq: Three times a day (TID) | ORAL | 0 refills | Status: AC

## 2020-08-28 MED ORDER — NAPROXEN 500 MG PO TABS: 500.0000 mg | ORAL_TABLET | Freq: Two times a day (BID) | ORAL | 0 refills | Status: DC

## 2020-08-28 NOTE — Progress Notes (Signed)
    SUBJECTIVE:   CHIEF COMPLAINT / HPI:   [name] is a 55 year old with history of mood disorder and recurrent dental infections presenting today with a right-sided mouth lesion.  This is been present for 1 week.  She gets these approximately every 2 months.  She reports right-sided mouth pain and what she describes as an abscess in her tooth.  She reports she is eating and drinking well.  She denies fevers.  She is able to swallow easily and has no ear pain, eye pain, significant sinus pain no neck pain or throat pain.  She reports she gets these frequently and the only resolving factors typically amoxicillin.  She has tried peroxide numbing medication and neighbors Vicodin for her symptoms.  She reports only the Vicodin helped.  She does not have a dentist.  She reports an intolerance to Augmentin and is certain that amoxicillin will help with this.  The patient is elevated blood pressure today.  She reports her blood pressure is usually normal.  She reports this is due to pain.  She denies headaches vision changes or chest pain  PERTINENT  PMH / PSH/Family/Social History : Reviewed not updated  OBJECTIVE:   BP (!) 140/103   Pulse 72   Ht 6\' 3"  (1.905 m)   Wt 258 lb (117 kg)   SpO2 100%   BMI 32.25 kg/m   Today's weight:  Last Weight  Most recent update: 08/28/2020 10:46 AM   Weight  117 kg (258 lb)           Review of prior weights: Filed Weights   08/28/20 1045  Weight: 258 lb (117 kg)   Bilateral tympanic membranes are clear without effusions or erythema.  No tenderness to palpation along frontal or maxillary sinuses no TMJ pain.  Submandibular area is soft and nontender.  She has no cervical lymphadenopathy. She has multiple missing teeth on the bilateral upper and lower molars.  There is a 1 cm erythematous painful nodule just anterior to the first molar on the right.  Overlying tenderness to this area no drainage  ASSESSMENT/PLAN:   Dental Abscess discussed that  dental management of this would be best as I suspect this needs drained.  I gave her a list of Medicaid dentist to call today.  I also placed a referral to oral surgery per patient's request.  Using shared decision making I recommended Augmentin.  The patient declined this.  Amoxicillin prescribed per request I discussed that this may not work as well as she could have anaerobic bacteria in the abscess itself.  Discussed pain control naproxen prescribed the patient was agreeable to this all questions were answered.  I do not think she has complications of this such as Ludwig's angina or systemic infection. No evidence of progression to sinuses. Monitor closely, discussed strict return precautions with patient which she repeated including fevers, worsening pain, difficulty keeping down fluids, or pain in sinus/mandibular area.   Elevated Blood Pressure Monitor, recommend close follow-up of this.  She has no signs of endorgan damage today and I suspect this is due to her uncontrolled pain.  Reviewed reasons to call and return to care for this.  Healthcare maintenance Up to date on COVID-vaccine     10/28/20, MD  Family Medicine Teaching Service  [facility]

## 2020-08-28 NOTE — Patient Instructions (Signed)
It was wonderful to see you today.  Please bring ALL of your medications with you to every visit.   Today we talked about:  --Calling the dentist  -- Starting an antibiotic   -- Take Naproxen WITH FOOD  --Go to the ER if you have eye pain, fevers, worsening of symptoms    Thank you for choosing [facility].   Please call [phone] with any questions about today's appointment.  Please be sure to schedule follow up at the front  desk before you leave today.   Terisa Starr, MD  Family Medicine

## 2020-09-15 DIAGNOSIS — M5441 Lumbago with sciatica, right side: Secondary | ICD-10-CM | POA: Diagnosis not present

## 2020-09-18 DIAGNOSIS — M5441 Lumbago with sciatica, right side: Secondary | ICD-10-CM | POA: Diagnosis not present

## 2020-09-19 DIAGNOSIS — M5441 Lumbago with sciatica, right side: Secondary | ICD-10-CM | POA: Diagnosis not present

## 2020-09-20 DIAGNOSIS — M5441 Lumbago with sciatica, right side: Secondary | ICD-10-CM | POA: Diagnosis not present

## 2020-09-21 DIAGNOSIS — M5441 Lumbago with sciatica, right side: Secondary | ICD-10-CM | POA: Diagnosis not present

## 2020-09-22 DIAGNOSIS — M5441 Lumbago with sciatica, right side: Secondary | ICD-10-CM | POA: Diagnosis not present

## 2020-09-25 DIAGNOSIS — M5441 Lumbago with sciatica, right side: Secondary | ICD-10-CM | POA: Diagnosis not present

## 2020-09-26 DIAGNOSIS — M5441 Lumbago with sciatica, right side: Secondary | ICD-10-CM | POA: Diagnosis not present

## 2020-09-27 DIAGNOSIS — M5441 Lumbago with sciatica, right side: Secondary | ICD-10-CM | POA: Diagnosis not present

## 2020-09-28 DIAGNOSIS — Z79891 Long term (current) use of opiate analgesic: Secondary | ICD-10-CM | POA: Diagnosis not present

## 2020-09-28 DIAGNOSIS — M5441 Lumbago with sciatica, right side: Secondary | ICD-10-CM | POA: Diagnosis not present

## 2020-09-28 DIAGNOSIS — F3181 Bipolar II disorder: Secondary | ICD-10-CM | POA: Diagnosis not present

## 2020-09-29 DIAGNOSIS — M5441 Lumbago with sciatica, right side: Secondary | ICD-10-CM | POA: Diagnosis not present

## 2020-10-02 DIAGNOSIS — M5441 Lumbago with sciatica, right side: Secondary | ICD-10-CM | POA: Diagnosis not present

## 2020-10-03 ENCOUNTER — Other Ambulatory Visit: Payer: Self-pay | Admitting: Family Medicine

## 2020-10-03 DIAGNOSIS — M5441 Lumbago with sciatica, right side: Secondary | ICD-10-CM | POA: Diagnosis not present

## 2020-10-04 DIAGNOSIS — M5441 Lumbago with sciatica, right side: Secondary | ICD-10-CM | POA: Diagnosis not present

## 2020-10-05 DIAGNOSIS — M5441 Lumbago with sciatica, right side: Secondary | ICD-10-CM | POA: Diagnosis not present

## 2020-10-06 DIAGNOSIS — M5441 Lumbago with sciatica, right side: Secondary | ICD-10-CM | POA: Diagnosis not present

## 2020-10-09 DIAGNOSIS — M5441 Lumbago with sciatica, right side: Secondary | ICD-10-CM | POA: Diagnosis not present

## 2020-10-10 DIAGNOSIS — M5441 Lumbago with sciatica, right side: Secondary | ICD-10-CM | POA: Diagnosis not present

## 2020-10-11 DIAGNOSIS — M5441 Lumbago with sciatica, right side: Secondary | ICD-10-CM | POA: Diagnosis not present

## 2020-10-12 DIAGNOSIS — M5441 Lumbago with sciatica, right side: Secondary | ICD-10-CM | POA: Diagnosis not present

## 2020-10-13 DIAGNOSIS — M5441 Lumbago with sciatica, right side: Secondary | ICD-10-CM | POA: Diagnosis not present

## 2020-10-16 DIAGNOSIS — M5441 Lumbago with sciatica, right side: Secondary | ICD-10-CM | POA: Diagnosis not present

## 2020-10-17 DIAGNOSIS — M5441 Lumbago with sciatica, right side: Secondary | ICD-10-CM | POA: Diagnosis not present

## 2020-10-18 DIAGNOSIS — M5441 Lumbago with sciatica, right side: Secondary | ICD-10-CM | POA: Diagnosis not present

## 2020-10-19 DIAGNOSIS — M5441 Lumbago with sciatica, right side: Secondary | ICD-10-CM | POA: Diagnosis not present

## 2020-10-20 DIAGNOSIS — M5441 Lumbago with sciatica, right side: Secondary | ICD-10-CM | POA: Diagnosis not present

## 2020-10-23 DIAGNOSIS — M5441 Lumbago with sciatica, right side: Secondary | ICD-10-CM | POA: Diagnosis not present

## 2020-10-24 DIAGNOSIS — M5441 Lumbago with sciatica, right side: Secondary | ICD-10-CM | POA: Diagnosis not present

## 2020-10-25 DIAGNOSIS — M5441 Lumbago with sciatica, right side: Secondary | ICD-10-CM | POA: Diagnosis not present

## 2020-10-26 ENCOUNTER — Ambulatory Visit (INDEPENDENT_AMBULATORY_CARE_PROVIDER_SITE_OTHER): Payer: Medicaid Other | Admitting: Family Medicine

## 2020-10-26 ENCOUNTER — Other Ambulatory Visit: Payer: Self-pay

## 2020-10-26 ENCOUNTER — Encounter: Payer: Self-pay | Admitting: Student

## 2020-10-26 VITALS — BP 150/85 | HR 74 | Ht 75.0 in | Wt 256.6 lb

## 2020-10-26 DIAGNOSIS — M170 Bilateral primary osteoarthritis of knee: Secondary | ICD-10-CM | POA: Diagnosis not present

## 2020-10-26 DIAGNOSIS — R030 Elevated blood-pressure reading, without diagnosis of hypertension: Secondary | ICD-10-CM | POA: Diagnosis not present

## 2020-10-26 DIAGNOSIS — M5441 Lumbago with sciatica, right side: Secondary | ICD-10-CM | POA: Diagnosis not present

## 2020-10-26 MED ORDER — METHYLPREDNISOLONE ACETATE 40 MG/ML IJ SUSP
40.0000 mg | Freq: Once | INTRAMUSCULAR | Status: AC
  Administered 2020-10-26: 40 mg via INTRAMUSCULAR

## 2020-10-26 NOTE — Patient Instructions (Signed)
It was great to meet you!  Today we did injections in both your knees. You may have increased pain/soreness initially but that should subside over the next 2 days.  Please schedule an appointment with Dr. Clent Ridges to follow up on your blood pressure. In the meantime, work on reducing your sodium (salt) intake. Read nutrition labels and aim for no more than 2000mg  per day. No more salt shaker!   Take care and seek immediate care sooner if you develop any concerns.  Dr. Family Medicine

## 2020-10-26 NOTE — Progress Notes (Signed)
    SUBJECTIVE:   CHIEF COMPLAINT / HPI:   Knee Injections Patient presents for bilateral knee injections.  Has a history of chronic knee pain secondary to osteoarthritis.  Imaging has shown moderate to advanced degenerative OA.  Reports she is a candidate for bilateral knee replacements, but has not gotten them yet (working on her weight first, also has mold in her house which she worries will complicate her healing).  She receives injections somewhat regularly.  Last steroid injection was April 2022.  Prior to that had Synvisc injections in December 2021 and steroid injections in November 2021. Pain has been worsening so she feels she is due for repeat injections.  PERTINENT  PMH / PSH: Chronic knee pain, chronic back pain  OBJECTIVE:   BP (!) 150/85   Pulse 74   Ht 6\' 3"  (1.905 m)   Wt 256 lb 9.6 oz (116.4 kg)   SpO2 98%   BMI 32.07 kg/m   General: NAD, pleasant, able to participate in exam Respiratory: No respiratory distress Skin: warm and dry, no rashes noted Psych: Normal affect and mood Neuro: grossly intact MSK: mild swelling of bilateral knees, tenderness to palpation of joint line bilaterally no erythema or warmth, no palpable fluid collection, full ROM both passive and active.  Knee Injection: Written and verbal consent was obtained after discussing the risks and benefits of the procedure with the patient. Bilateral anterior knees were cleansed in a sterile fashion with betadine. 40 mg Depo-medrol and 3 cc 1% Lidocaine was injected in each knee using an anteromedial approach using a 5 cc syringe and 25 gauge 11/2 in needle. No complications were encountered. Minimal blood loss. A band aid was applied.   ASSESSMENT/PLAN:   Knee Injections Patient presented for bilateral knee injections due to osteoarthritis. See procedure note above. Patient tolerated the procedure well.   HTN Patient's blood pressure elevated to 150/85 today. Per chart review, it has been elevated  at prior visits as well. Patient is not amenable to starting medication for her blood pressure. She has a cuff at home and is going to keep a written log and follow up with her PCP. Counseled on reducing sodium intake in the meantime.   , MD [facility]

## 2020-10-27 DIAGNOSIS — M5441 Lumbago with sciatica, right side: Secondary | ICD-10-CM | POA: Diagnosis not present

## 2020-10-30 DIAGNOSIS — M5441 Lumbago with sciatica, right side: Secondary | ICD-10-CM | POA: Diagnosis not present

## 2020-10-31 DIAGNOSIS — M5441 Lumbago with sciatica, right side: Secondary | ICD-10-CM | POA: Diagnosis not present

## 2020-11-01 DIAGNOSIS — M5441 Lumbago with sciatica, right side: Secondary | ICD-10-CM | POA: Diagnosis not present

## 2020-11-02 DIAGNOSIS — M5441 Lumbago with sciatica, right side: Secondary | ICD-10-CM | POA: Diagnosis not present

## 2020-11-03 DIAGNOSIS — M5441 Lumbago with sciatica, right side: Secondary | ICD-10-CM | POA: Diagnosis not present

## 2020-11-06 DIAGNOSIS — M5441 Lumbago with sciatica, right side: Secondary | ICD-10-CM | POA: Diagnosis not present

## 2020-11-07 DIAGNOSIS — M5441 Lumbago with sciatica, right side: Secondary | ICD-10-CM | POA: Diagnosis not present

## 2020-11-08 DIAGNOSIS — M5441 Lumbago with sciatica, right side: Secondary | ICD-10-CM | POA: Diagnosis not present

## 2020-11-09 DIAGNOSIS — M5441 Lumbago with sciatica, right side: Secondary | ICD-10-CM | POA: Diagnosis not present

## 2020-11-10 DIAGNOSIS — M5441 Lumbago with sciatica, right side: Secondary | ICD-10-CM | POA: Diagnosis not present

## 2020-11-13 DIAGNOSIS — M5441 Lumbago with sciatica, right side: Secondary | ICD-10-CM | POA: Diagnosis not present

## 2020-11-14 DIAGNOSIS — M5441 Lumbago with sciatica, right side: Secondary | ICD-10-CM | POA: Diagnosis not present

## 2020-11-15 DIAGNOSIS — M5441 Lumbago with sciatica, right side: Secondary | ICD-10-CM | POA: Diagnosis not present

## 2020-11-16 DIAGNOSIS — M5441 Lumbago with sciatica, right side: Secondary | ICD-10-CM | POA: Diagnosis not present

## 2020-11-17 DIAGNOSIS — M5441 Lumbago with sciatica, right side: Secondary | ICD-10-CM | POA: Diagnosis not present

## 2020-11-20 DIAGNOSIS — M5441 Lumbago with sciatica, right side: Secondary | ICD-10-CM | POA: Diagnosis not present

## 2020-11-21 DIAGNOSIS — M5441 Lumbago with sciatica, right side: Secondary | ICD-10-CM | POA: Diagnosis not present

## 2020-11-22 DIAGNOSIS — M5441 Lumbago with sciatica, right side: Secondary | ICD-10-CM | POA: Diagnosis not present

## 2020-11-23 DIAGNOSIS — M5441 Lumbago with sciatica, right side: Secondary | ICD-10-CM | POA: Diagnosis not present

## 2020-11-24 DIAGNOSIS — M5441 Lumbago with sciatica, right side: Secondary | ICD-10-CM | POA: Diagnosis not present

## 2020-11-27 DIAGNOSIS — M5441 Lumbago with sciatica, right side: Secondary | ICD-10-CM | POA: Diagnosis not present

## 2020-11-28 DIAGNOSIS — M5441 Lumbago with sciatica, right side: Secondary | ICD-10-CM | POA: Diagnosis not present

## 2020-11-29 DIAGNOSIS — M5441 Lumbago with sciatica, right side: Secondary | ICD-10-CM | POA: Diagnosis not present

## 2020-11-30 DIAGNOSIS — M5441 Lumbago with sciatica, right side: Secondary | ICD-10-CM | POA: Diagnosis not present

## 2020-12-01 DIAGNOSIS — M5441 Lumbago with sciatica, right side: Secondary | ICD-10-CM | POA: Diagnosis not present

## 2020-12-04 DIAGNOSIS — M5441 Lumbago with sciatica, right side: Secondary | ICD-10-CM | POA: Diagnosis not present

## 2020-12-05 DIAGNOSIS — M5441 Lumbago with sciatica, right side: Secondary | ICD-10-CM | POA: Diagnosis not present

## 2020-12-06 DIAGNOSIS — M5441 Lumbago with sciatica, right side: Secondary | ICD-10-CM | POA: Diagnosis not present

## 2020-12-07 ENCOUNTER — Ambulatory Visit: Payer: Medicaid Other

## 2020-12-07 DIAGNOSIS — M5441 Lumbago with sciatica, right side: Secondary | ICD-10-CM | POA: Diagnosis not present

## 2020-12-08 DIAGNOSIS — M5441 Lumbago with sciatica, right side: Secondary | ICD-10-CM | POA: Diagnosis not present

## 2020-12-11 DIAGNOSIS — M5441 Lumbago with sciatica, right side: Secondary | ICD-10-CM | POA: Diagnosis not present

## 2020-12-12 DIAGNOSIS — M5441 Lumbago with sciatica, right side: Secondary | ICD-10-CM | POA: Diagnosis not present

## 2020-12-13 DIAGNOSIS — M5441 Lumbago with sciatica, right side: Secondary | ICD-10-CM | POA: Diagnosis not present

## 2020-12-14 DIAGNOSIS — M5441 Lumbago with sciatica, right side: Secondary | ICD-10-CM | POA: Diagnosis not present

## 2020-12-14 NOTE — Progress Notes (Deleted)
    SUBJECTIVE:   CHIEF COMPLAINT / HPI: Exposure to mold  Patient has been seen for this in the past. ***  PERTINENT  PMH / PSH: Anxiety, bipolar disorder  OBJECTIVE:   There were no vitals taken for this visit.  General: ***, NAD CV: RRR, no murmurs*** Pulm: CTAB, no wheezes or rales  ASSESSMENT/PLAN:   No problem-specific Assessment & Plan notes found for this encounter.     Littie Deeds, MD [facility]   {    This will disappear when note is signed, click to select method of visit    :1}

## 2020-12-15 ENCOUNTER — Ambulatory Visit: Payer: Medicaid Other

## 2020-12-15 DIAGNOSIS — M5441 Lumbago with sciatica, right side: Secondary | ICD-10-CM | POA: Diagnosis not present

## 2020-12-18 DIAGNOSIS — M5441 Lumbago with sciatica, right side: Secondary | ICD-10-CM | POA: Diagnosis not present

## 2020-12-19 DIAGNOSIS — M5441 Lumbago with sciatica, right side: Secondary | ICD-10-CM | POA: Diagnosis not present

## 2020-12-20 DIAGNOSIS — M5441 Lumbago with sciatica, right side: Secondary | ICD-10-CM | POA: Diagnosis not present

## 2020-12-21 ENCOUNTER — Ambulatory Visit (INDEPENDENT_AMBULATORY_CARE_PROVIDER_SITE_OTHER): Payer: Medicaid Other | Admitting: Family Medicine

## 2020-12-21 ENCOUNTER — Other Ambulatory Visit: Payer: Self-pay | Admitting: Family Medicine

## 2020-12-21 ENCOUNTER — Other Ambulatory Visit: Payer: Self-pay

## 2020-12-21 VITALS — BP 134/80 | HR 80 | Ht 75.0 in | Wt 253.2 lb

## 2020-12-21 DIAGNOSIS — Z77120 Contact with and (suspected) exposure to mold (toxic): Secondary | ICD-10-CM

## 2020-12-21 DIAGNOSIS — Z720 Tobacco use: Secondary | ICD-10-CM | POA: Diagnosis not present

## 2020-12-21 DIAGNOSIS — J4489 Other specified chronic obstructive pulmonary disease: Secondary | ICD-10-CM

## 2020-12-21 DIAGNOSIS — J449 Chronic obstructive pulmonary disease, unspecified: Secondary | ICD-10-CM

## 2020-12-21 DIAGNOSIS — M5441 Lumbago with sciatica, right side: Secondary | ICD-10-CM | POA: Diagnosis not present

## 2020-12-21 MED ORDER — DULERA 200-5 MCG/ACT IN AERO: 2.0000 | INHALATION_SPRAY | Freq: Two times a day (BID) | RESPIRATORY_TRACT | 0 refills | Status: DC

## 2020-12-21 NOTE — Patient Instructions (Addendum)
Thank you for coming to see me today. It was a pleasure.   Take Dulera 2 puffs twice a day  Good job on decreasing smoking to three cigarettes a day, lets see if we can go lower and eventually quit if possible  Please follow-up with PCP as needed  Have fun at the beach  If you have any questions or concerns, please do not hesitate to call the office at [phone].  Best,   Dana Allan, MD

## 2020-12-21 NOTE — Progress Notes (Signed)
    SUBJECTIVE:   CHIEF COMPLAINT / HPI: concern for exposure to mold  Has been ongoing for years.  Recently housing had been cleaning and noticed worsening shortness of breath requiring increase in [facility] inhaler.  Denies any fevers, chest pain, worsening exertional shortness of breath, lower extremity edema.  Has cut back in smoking. Uses can to help get around secondary to low back pain   PERTINENT  PMH / PSH:  Varicose veins Peeripheral neuropathy OA Tobacco use Anxiety Chronic pain  OBJECTIVE:   BP 134/80   Pulse 80   Ht 6\' 3"  (1.905 m)   Wt 253 lb 3.2 oz (114.9 kg)   SpO2 99%   BMI 31.65 kg/m    General: Alert, no acute distress Cardio: Normal S1 and S2, RRR, no r/m/g Pulm: CTAB, normal work of breathing  Extremities: No peripheral edema.    ASSESSMENT/PLAN:   Exposure to mold Reported exposure to mold and increase in [facility] inhaler.  No likely cardiac given no elevated JVD, clear lungs on exam and no lower extremity edema -Continue Dulera -Encourage smoking cessation -Driving Placard form completed -Follow up as needed     [facility], MD [facility]

## 2020-12-22 DIAGNOSIS — M5441 Lumbago with sciatica, right side: Secondary | ICD-10-CM | POA: Diagnosis not present

## 2020-12-25 ENCOUNTER — Encounter: Payer: Self-pay | Admitting: Family Medicine

## 2020-12-25 DIAGNOSIS — M5441 Lumbago with sciatica, right side: Secondary | ICD-10-CM | POA: Diagnosis not present

## 2020-12-25 NOTE — Assessment & Plan Note (Signed)
Reported exposure to mold and increase in [facility] inhaler.  No likely cardiac given no elevated JVD, clear lungs on exam and no lower extremity edema -Continue Dulera -Encourage smoking cessation -Driving Placard form completed -Follow up as needed

## 2020-12-26 DIAGNOSIS — M5441 Lumbago with sciatica, right side: Secondary | ICD-10-CM | POA: Diagnosis not present

## 2020-12-27 DIAGNOSIS — M5441 Lumbago with sciatica, right side: Secondary | ICD-10-CM | POA: Diagnosis not present

## 2020-12-28 DIAGNOSIS — M5441 Lumbago with sciatica, right side: Secondary | ICD-10-CM | POA: Diagnosis not present

## 2020-12-29 DIAGNOSIS — M5441 Lumbago with sciatica, right side: Secondary | ICD-10-CM | POA: Diagnosis not present

## 2021-01-01 DIAGNOSIS — M5441 Lumbago with sciatica, right side: Secondary | ICD-10-CM | POA: Diagnosis not present

## 2021-01-02 DIAGNOSIS — M5441 Lumbago with sciatica, right side: Secondary | ICD-10-CM | POA: Diagnosis not present

## 2021-01-03 ENCOUNTER — Telehealth: Payer: Self-pay

## 2021-01-03 DIAGNOSIS — M5441 Lumbago with sciatica, right side: Secondary | ICD-10-CM | POA: Diagnosis not present

## 2021-01-03 NOTE — Telephone Encounter (Signed)
Patient calls nurse line requesting medication for oral thrush. Patient reports that a few times she has used her dulera inhaler and did not rinse her mouth after use. Patient now reports white patches in mouth and on tongue. Of note, we do not have any appointments until next week.  Please advise.   Veronda Prude, RN

## 2021-01-04 DIAGNOSIS — M5441 Lumbago with sciatica, right side: Secondary | ICD-10-CM | POA: Diagnosis not present

## 2021-01-05 ENCOUNTER — Other Ambulatory Visit: Payer: Self-pay | Admitting: Family Medicine

## 2021-01-05 DIAGNOSIS — M5441 Lumbago with sciatica, right side: Secondary | ICD-10-CM | POA: Diagnosis not present

## 2021-01-05 MED ORDER — NYSTATIN 100000 UNIT/ML MT SUSP: 5.0000 mL | Freq: Four times a day (QID) | OROMUCOSAL | 0 refills | Status: AC

## 2021-01-05 NOTE — Telephone Encounter (Signed)
Prescription sent for Nystatin swish and swallow. To complete 5 day course. Thanks  Dana Allan, MD Family Medicine Residency

## 2021-01-08 DIAGNOSIS — M5441 Lumbago with sciatica, right side: Secondary | ICD-10-CM | POA: Diagnosis not present

## 2021-01-09 DIAGNOSIS — M5441 Lumbago with sciatica, right side: Secondary | ICD-10-CM | POA: Diagnosis not present

## 2021-01-10 ENCOUNTER — Ambulatory Visit: Payer: Medicaid Other

## 2021-01-10 DIAGNOSIS — M5441 Lumbago with sciatica, right side: Secondary | ICD-10-CM | POA: Diagnosis not present

## 2021-01-11 DIAGNOSIS — M5441 Lumbago with sciatica, right side: Secondary | ICD-10-CM | POA: Diagnosis not present

## 2021-01-12 ENCOUNTER — Ambulatory Visit: Payer: Medicaid Other | Admitting: Family Medicine

## 2021-01-12 ENCOUNTER — Other Ambulatory Visit: Payer: Self-pay

## 2021-01-12 ENCOUNTER — Telehealth: Payer: Self-pay | Admitting: *Deleted

## 2021-01-12 DIAGNOSIS — M5441 Lumbago with sciatica, right side: Secondary | ICD-10-CM | POA: Diagnosis not present

## 2021-01-12 NOTE — Telephone Encounter (Signed)
Contacted pt today after she left before she could be brought back due to [place] running behind. I apologized that we did not get to see her today but let her know that she was to early for a bilateral knee injections and asked her if she still needed to come in for her appointment on Monday and she said no so I am cancelling her appointment in ATC. Daysie Helf Zimmerman Rumple, CMA

## 2021-01-15 ENCOUNTER — Ambulatory Visit: Payer: Medicaid Other

## 2021-01-15 DIAGNOSIS — M5441 Lumbago with sciatica, right side: Secondary | ICD-10-CM | POA: Diagnosis not present

## 2021-01-16 DIAGNOSIS — M5441 Lumbago with sciatica, right side: Secondary | ICD-10-CM | POA: Diagnosis not present

## 2021-01-17 DIAGNOSIS — M5441 Lumbago with sciatica, right side: Secondary | ICD-10-CM | POA: Diagnosis not present

## 2021-01-18 DIAGNOSIS — M5441 Lumbago with sciatica, right side: Secondary | ICD-10-CM | POA: Diagnosis not present

## 2021-01-19 DIAGNOSIS — M5441 Lumbago with sciatica, right side: Secondary | ICD-10-CM | POA: Diagnosis not present

## 2021-01-22 DIAGNOSIS — M5441 Lumbago with sciatica, right side: Secondary | ICD-10-CM | POA: Diagnosis not present

## 2021-01-23 DIAGNOSIS — M5441 Lumbago with sciatica, right side: Secondary | ICD-10-CM | POA: Diagnosis not present

## 2021-01-23 DIAGNOSIS — F3181 Bipolar II disorder: Secondary | ICD-10-CM | POA: Diagnosis not present

## 2021-01-24 DIAGNOSIS — M5441 Lumbago with sciatica, right side: Secondary | ICD-10-CM | POA: Diagnosis not present

## 2021-01-25 DIAGNOSIS — M5441 Lumbago with sciatica, right side: Secondary | ICD-10-CM | POA: Diagnosis not present

## 2021-01-26 DIAGNOSIS — M5441 Lumbago with sciatica, right side: Secondary | ICD-10-CM | POA: Diagnosis not present

## 2021-01-29 DIAGNOSIS — M5441 Lumbago with sciatica, right side: Secondary | ICD-10-CM | POA: Diagnosis not present

## 2021-01-30 DIAGNOSIS — M5441 Lumbago with sciatica, right side: Secondary | ICD-10-CM | POA: Diagnosis not present

## 2021-01-31 DIAGNOSIS — M5441 Lumbago with sciatica, right side: Secondary | ICD-10-CM | POA: Diagnosis not present

## 2021-02-01 DIAGNOSIS — M5441 Lumbago with sciatica, right side: Secondary | ICD-10-CM | POA: Diagnosis not present

## 2021-02-02 DIAGNOSIS — M5441 Lumbago with sciatica, right side: Secondary | ICD-10-CM | POA: Diagnosis not present

## 2021-02-05 DIAGNOSIS — M5441 Lumbago with sciatica, right side: Secondary | ICD-10-CM | POA: Diagnosis not present

## 2021-02-06 DIAGNOSIS — M5441 Lumbago with sciatica, right side: Secondary | ICD-10-CM | POA: Diagnosis not present

## 2021-02-07 DIAGNOSIS — M5441 Lumbago with sciatica, right side: Secondary | ICD-10-CM | POA: Diagnosis not present

## 2021-02-08 DIAGNOSIS — M5441 Lumbago with sciatica, right side: Secondary | ICD-10-CM | POA: Diagnosis not present

## 2021-02-09 DIAGNOSIS — M5441 Lumbago with sciatica, right side: Secondary | ICD-10-CM | POA: Diagnosis not present

## 2021-02-12 ENCOUNTER — Ambulatory Visit (INDEPENDENT_AMBULATORY_CARE_PROVIDER_SITE_OTHER): Payer: Medicaid Other | Admitting: Family Medicine

## 2021-02-12 ENCOUNTER — Other Ambulatory Visit: Payer: Self-pay

## 2021-02-12 ENCOUNTER — Encounter: Payer: Self-pay | Admitting: Family Medicine

## 2021-02-12 VITALS — BP 142/80 | HR 80 | Wt 252.0 lb

## 2021-02-12 DIAGNOSIS — M170 Bilateral primary osteoarthritis of knee: Secondary | ICD-10-CM

## 2021-02-12 DIAGNOSIS — M5441 Lumbago with sciatica, right side: Secondary | ICD-10-CM | POA: Diagnosis not present

## 2021-02-12 MED ORDER — METHYLPREDNISOLONE ACETATE 40 MG/ML IJ SUSP
40.0000 mg | Freq: Once | INTRAMUSCULAR | Status: AC
  Administered 2021-02-12: 40 mg via INTRAMUSCULAR

## 2021-02-12 NOTE — Patient Instructions (Addendum)
It was nice seeing you today!  You had steroid injections for both of your knees today.  Stay well, Cathy Deeds, MD [facility] [phone]

## 2021-02-12 NOTE — Progress Notes (Signed)
    SUBJECTIVE:   CHIEF COMPLAINT / HPI:   Patient presents for worsening chronic bilateral knee pain requesting bilateral knee injections.  Most recent corticosteroid injection 10/26/2020.  States she had about 2 months of relief from that injection.  Prior imaging has shown moderate to advanced degenerative OA.  PERTINENT  PMH / PSH: OA  OBJECTIVE:   BP (!) 142/80   Pulse 80   Wt 252 lb (114.3 kg)   SpO2 97%   BMI 31.50 kg/m   General: Alert, NAD MSK: No obvious deformity or swelling, no tenderness to palpation.  Full ROM but does have pain elicited with extension.  ASSESSMENT/PLAN:   Knee Injection: Written and verbal consent was obtained after discussing the risks and benefits of the procedure with the patient. Bilateral anterior knees were cleansed in a sterile fashion with betadine. 40 mg Depo-medrol and 3 cc 1% Lidocaine was injected in each knee using an anteromedial approach using a 5 cc syringe and 25 gauge 11/2 in needle. No complications were encountered. Minimal blood loss. A band aid was applied.   Osteoarthritis of knee Bilateral knee injections performed as above.     Littie Deeds, MD [facility]

## 2021-02-12 NOTE — Assessment & Plan Note (Signed)
Bilateral knee injections performed as above.

## 2021-02-13 DIAGNOSIS — M5441 Lumbago with sciatica, right side: Secondary | ICD-10-CM | POA: Diagnosis not present

## 2021-02-14 DIAGNOSIS — M5441 Lumbago with sciatica, right side: Secondary | ICD-10-CM | POA: Diagnosis not present

## 2021-02-15 DIAGNOSIS — M5441 Lumbago with sciatica, right side: Secondary | ICD-10-CM | POA: Diagnosis not present

## 2021-02-16 DIAGNOSIS — M5441 Lumbago with sciatica, right side: Secondary | ICD-10-CM | POA: Diagnosis not present

## 2021-02-19 DIAGNOSIS — M5441 Lumbago with sciatica, right side: Secondary | ICD-10-CM | POA: Diagnosis not present

## 2021-02-20 DIAGNOSIS — M5441 Lumbago with sciatica, right side: Secondary | ICD-10-CM | POA: Diagnosis not present

## 2021-02-21 DIAGNOSIS — M5441 Lumbago with sciatica, right side: Secondary | ICD-10-CM | POA: Diagnosis not present

## 2021-02-22 DIAGNOSIS — M5441 Lumbago with sciatica, right side: Secondary | ICD-10-CM | POA: Diagnosis not present

## 2021-02-23 DIAGNOSIS — M5441 Lumbago with sciatica, right side: Secondary | ICD-10-CM | POA: Diagnosis not present

## 2021-02-26 DIAGNOSIS — M5441 Lumbago with sciatica, right side: Secondary | ICD-10-CM | POA: Diagnosis not present

## 2021-02-27 DIAGNOSIS — M5441 Lumbago with sciatica, right side: Secondary | ICD-10-CM | POA: Diagnosis not present

## 2021-02-28 DIAGNOSIS — M5441 Lumbago with sciatica, right side: Secondary | ICD-10-CM | POA: Diagnosis not present

## 2021-03-01 DIAGNOSIS — M5441 Lumbago with sciatica, right side: Secondary | ICD-10-CM | POA: Diagnosis not present

## 2021-03-02 DIAGNOSIS — M5441 Lumbago with sciatica, right side: Secondary | ICD-10-CM | POA: Diagnosis not present

## 2021-03-05 DIAGNOSIS — M5441 Lumbago with sciatica, right side: Secondary | ICD-10-CM | POA: Diagnosis not present

## 2021-03-06 DIAGNOSIS — M5441 Lumbago with sciatica, right side: Secondary | ICD-10-CM | POA: Diagnosis not present

## 2021-03-07 DIAGNOSIS — M5441 Lumbago with sciatica, right side: Secondary | ICD-10-CM | POA: Diagnosis not present

## 2021-03-08 DIAGNOSIS — M5441 Lumbago with sciatica, right side: Secondary | ICD-10-CM | POA: Diagnosis not present

## 2021-03-09 DIAGNOSIS — M5441 Lumbago with sciatica, right side: Secondary | ICD-10-CM | POA: Diagnosis not present

## 2021-03-12 DIAGNOSIS — M5441 Lumbago with sciatica, right side: Secondary | ICD-10-CM | POA: Diagnosis not present

## 2021-03-13 DIAGNOSIS — M5441 Lumbago with sciatica, right side: Secondary | ICD-10-CM | POA: Diagnosis not present

## 2021-03-14 ENCOUNTER — Other Ambulatory Visit: Payer: Self-pay | Admitting: Family Medicine

## 2021-03-14 DIAGNOSIS — M5441 Lumbago with sciatica, right side: Secondary | ICD-10-CM | POA: Diagnosis not present

## 2021-03-15 DIAGNOSIS — M5441 Lumbago with sciatica, right side: Secondary | ICD-10-CM | POA: Diagnosis not present

## 2021-03-16 DIAGNOSIS — M5441 Lumbago with sciatica, right side: Secondary | ICD-10-CM | POA: Diagnosis not present

## 2021-03-19 DIAGNOSIS — M5441 Lumbago with sciatica, right side: Secondary | ICD-10-CM | POA: Diagnosis not present

## 2021-03-20 DIAGNOSIS — M5441 Lumbago with sciatica, right side: Secondary | ICD-10-CM | POA: Diagnosis not present

## 2021-03-21 DIAGNOSIS — M5441 Lumbago with sciatica, right side: Secondary | ICD-10-CM | POA: Diagnosis not present

## 2021-03-22 DIAGNOSIS — M5441 Lumbago with sciatica, right side: Secondary | ICD-10-CM | POA: Diagnosis not present

## 2021-03-23 DIAGNOSIS — M5441 Lumbago with sciatica, right side: Secondary | ICD-10-CM | POA: Diagnosis not present

## 2021-03-26 DIAGNOSIS — M5441 Lumbago with sciatica, right side: Secondary | ICD-10-CM | POA: Diagnosis not present

## 2021-03-27 DIAGNOSIS — M5441 Lumbago with sciatica, right side: Secondary | ICD-10-CM | POA: Diagnosis not present

## 2021-03-28 DIAGNOSIS — M5441 Lumbago with sciatica, right side: Secondary | ICD-10-CM | POA: Diagnosis not present

## 2021-03-29 DIAGNOSIS — M5441 Lumbago with sciatica, right side: Secondary | ICD-10-CM | POA: Diagnosis not present

## 2021-03-30 DIAGNOSIS — M5441 Lumbago with sciatica, right side: Secondary | ICD-10-CM | POA: Diagnosis not present

## 2021-04-02 DIAGNOSIS — M5441 Lumbago with sciatica, right side: Secondary | ICD-10-CM | POA: Diagnosis not present

## 2021-04-03 DIAGNOSIS — M5441 Lumbago with sciatica, right side: Secondary | ICD-10-CM | POA: Diagnosis not present

## 2021-04-04 DIAGNOSIS — M5441 Lumbago with sciatica, right side: Secondary | ICD-10-CM | POA: Diagnosis not present

## 2021-04-05 DIAGNOSIS — M5441 Lumbago with sciatica, right side: Secondary | ICD-10-CM | POA: Diagnosis not present

## 2021-04-06 DIAGNOSIS — M5441 Lumbago with sciatica, right side: Secondary | ICD-10-CM | POA: Diagnosis not present

## 2021-04-09 DIAGNOSIS — M5441 Lumbago with sciatica, right side: Secondary | ICD-10-CM | POA: Diagnosis not present

## 2021-04-10 DIAGNOSIS — M5441 Lumbago with sciatica, right side: Secondary | ICD-10-CM | POA: Diagnosis not present

## 2021-04-11 DIAGNOSIS — M5441 Lumbago with sciatica, right side: Secondary | ICD-10-CM | POA: Diagnosis not present

## 2021-04-12 DIAGNOSIS — M5441 Lumbago with sciatica, right side: Secondary | ICD-10-CM | POA: Diagnosis not present

## 2021-04-13 DIAGNOSIS — M5441 Lumbago with sciatica, right side: Secondary | ICD-10-CM | POA: Diagnosis not present

## 2021-04-16 DIAGNOSIS — M5441 Lumbago with sciatica, right side: Secondary | ICD-10-CM | POA: Diagnosis not present

## 2021-04-17 DIAGNOSIS — M5441 Lumbago with sciatica, right side: Secondary | ICD-10-CM | POA: Diagnosis not present

## 2021-04-18 ENCOUNTER — Other Ambulatory Visit: Payer: Self-pay

## 2021-04-18 ENCOUNTER — Ambulatory Visit (INDEPENDENT_AMBULATORY_CARE_PROVIDER_SITE_OTHER): Payer: Medicaid Other | Admitting: Family Medicine

## 2021-04-18 ENCOUNTER — Encounter: Payer: Self-pay | Admitting: Family Medicine

## 2021-04-18 VITALS — BP 153/110 | HR 83 | Ht 75.0 in | Wt 254.6 lb

## 2021-04-18 DIAGNOSIS — M5441 Lumbago with sciatica, right side: Secondary | ICD-10-CM | POA: Diagnosis not present

## 2021-04-18 DIAGNOSIS — M170 Bilateral primary osteoarthritis of knee: Secondary | ICD-10-CM | POA: Diagnosis present

## 2021-04-18 MED ORDER — METHYLPREDNISOLONE ACETATE 40 MG/ML IJ SUSP
40.0000 mg | Freq: Once | INTRAMUSCULAR | Status: AC
  Administered 2021-04-18: 17:00:00 40 mg via INTRAMUSCULAR

## 2021-04-18 NOTE — Patient Instructions (Signed)
Thank you for coming to see me today. It was a pleasure.   You received a corticosteroid injection into your knee.  You may experience worsening pain in the first 24-48 hours but then the pain should improve. You can treat pain with Tylenol 500mg  every 6 hours as needed Capsaicin, aspercreme, or biofreeze topically up to four times a day may also help with pain. Some supplements that may help for arthritis: Boswellia extract, curcumin, pycnogenol Voltaren gel or pennsaid topically 4 times a day.  It's important that you continue to stay active.  Perform th following exercise: Straight leg raises, knee extensions 3 sets of 10 once a day (add ankle weight if these become too easy).  Consider physical therapy to strengthen muscles around the joint that hurts to take pressure off of the joint itself. Shoe inserts with good arch support may be helpful.  You can get these at good running stores like or [employer]. Heat or ice 15 minutes at a time 3-4 times a day as needed to help with pain. Water aerobics and cycling with low resistance are the best two types of exercise for arthritis though any exercise is ok as long as it doesn't worsen the pain.  Follow up if you develop redness, swelling, worsening pain after 48 hours, or fevers/chills.    If you have any questions or concerns, please do not hesitate to call the office at [phone].  Best,   [phone], MD

## 2021-04-18 NOTE — Progress Notes (Addendum)
° ° °  SUBJECTIVE:   CHIEF COMPLAINT / HPI: knee injections  Presents for bilateral knee injections.  Last injects in Nov/22 with mild relief. Has had multiple injections in past.  Continues to have pain in bilateral knees.  Would like referral to Ortho for discussion of knee replacement.  PERTINENT  PMH / PSH:  Bilateral knee osteoarthritis  OBJECTIVE:   BP (!) 153/110    Pulse 83    Ht 6\' 3"  (1.905 m)    Wt 254 lb 9.6 oz (115.5 kg)    SpO2 99%    BMI 31.82 kg/m    General: Alert, no acute distress Knee: - Inspection: No gross deformity.Mild swelling/effusion bilaterally.  No erythema or bruising. Skin intact - Palpation: no TTP - ROM: full active ROM with flexion and extension in knee and pain elicited with extension. - Strength: 5/5 strength - Neuro/vasc: NV intact  ASSESSMENT/PLAN:   Arthritis of knee, degenerative After informed written consent timeout was performed, patient was seated on exam table. Left knee was prepped with alcohol swab and utilizing anteromedial approach, patient's left knee was injected intraarticularly with 3:1 lidoicaine: depomedrol. Patient tolerated the procedure well without immediate complications.   After informed written consent timeout was performed, patient was seated on exam table. Right knee was prepped with alcohol swab and utilizing anteromedial approach, patient's right knee was injected intraarticularly with 3:1 lidocaine: depomedrol. Patient tolerated the procedure well without immediate complications.  Refer to Ortho, Dr Percell Miller, evaluation for bilateral knee replacement.  Follow up as needed     Carollee Leitz, MD [place]

## 2021-04-19 DIAGNOSIS — M5441 Lumbago with sciatica, right side: Secondary | ICD-10-CM | POA: Diagnosis not present

## 2021-04-20 DIAGNOSIS — M5441 Lumbago with sciatica, right side: Secondary | ICD-10-CM | POA: Diagnosis not present

## 2021-04-22 ENCOUNTER — Encounter: Payer: Self-pay | Admitting: Family Medicine

## 2021-04-22 NOTE — Assessment & Plan Note (Signed)
After informed written consent timeout was performed, patient was seated on exam table. Left knee was prepped with alcohol swab and utilizing anteromedial approach, patient's left knee was injected intraarticularly with 3:1 lidoicaine: depomedrol. Patient tolerated the procedure well without immediate complications.   After informed written consent timeout was performed, patient was seated on exam table. Right knee was prepped with alcohol swab and utilizing anteromedial approach, patient's right knee was injected intraarticularly with 3:1 lidocaine: depomedrol. Patient tolerated the procedure well without immediate complications.  Refer to Ortho, Dr Eulah Pont, evaluation for bilateral knee replacement.  Follow up as needed

## 2021-04-22 NOTE — Assessment & Plan Note (Signed)
>>  ASSESSMENT AND PLAN FOR ARTHRITIS OF KNEE, DEGENERATIVE WRITTEN ON 04/22/2021 10:50 AM BY Dana Allan, MD  After informed written consent timeout was performed, patient was seated on exam table. Left knee was prepped with alcohol swab and utilizing anteromedial approach, patient's left knee was injected intraarticularly with 3:1 lidoicaine: depomedrol. Patient tolerated the procedure well without immediate complications.   After informed written consent timeout was performed, patient was seated on exam table. Right knee was prepped with alcohol swab and utilizing anteromedial approach, patient's right knee was injected intraarticularly with 3:1 lidocaine: depomedrol. Patient tolerated the procedure well without immediate complications.  Refer to Ortho, Dr Eulah Pont, evaluation for bilateral knee replacement.  Follow up as needed

## 2021-04-22 NOTE — Assessment & Plan Note (Signed)
>>  ASSESSMENT AND PLAN FOR KNEE PAIN, RIGHT, CHRONIC WRITTEN ON 02/26/2022  5:39 PM BY [name], MD  >>ASSESSMENT AND PLAN FOR ARTHRITIS OF KNEE, DEGENERATIVE WRITTEN ON 04/22/2021 10:50 AM BY Dana Allan, MD  After informed written consent timeout was performed, patient was seated on exam table. Left knee was prepped with alcohol swab and utilizing anteromedial approach, patient's left knee was injected intraarticularly with 3:1 lidoicaine: depomedrol. Patient tolerated the procedure well without immediate complications.   After informed written consent timeout was performed, patient was seated on exam table. Right knee was prepped with alcohol swab and utilizing anteromedial approach, patient's right knee was injected intraarticularly with 3:1 lidocaine: depomedrol. Patient tolerated the procedure well without immediate complications.  Refer to Ortho, Dr Eulah Pont, evaluation for bilateral knee replacement.  Follow up as needed

## 2021-04-23 DIAGNOSIS — M5441 Lumbago with sciatica, right side: Secondary | ICD-10-CM | POA: Diagnosis not present

## 2021-04-24 DIAGNOSIS — M5441 Lumbago with sciatica, right side: Secondary | ICD-10-CM | POA: Diagnosis not present

## 2021-04-24 DIAGNOSIS — F3181 Bipolar II disorder: Secondary | ICD-10-CM | POA: Diagnosis not present

## 2021-04-25 DIAGNOSIS — M5441 Lumbago with sciatica, right side: Secondary | ICD-10-CM | POA: Diagnosis not present

## 2021-04-25 DIAGNOSIS — Z79891 Long term (current) use of opiate analgesic: Secondary | ICD-10-CM | POA: Diagnosis not present

## 2021-04-26 DIAGNOSIS — M5441 Lumbago with sciatica, right side: Secondary | ICD-10-CM | POA: Diagnosis not present

## 2021-04-27 DIAGNOSIS — M5441 Lumbago with sciatica, right side: Secondary | ICD-10-CM | POA: Diagnosis not present

## 2021-04-30 DIAGNOSIS — M5441 Lumbago with sciatica, right side: Secondary | ICD-10-CM | POA: Diagnosis not present

## 2021-05-01 DIAGNOSIS — M5441 Lumbago with sciatica, right side: Secondary | ICD-10-CM | POA: Diagnosis not present

## 2021-05-02 DIAGNOSIS — M5441 Lumbago with sciatica, right side: Secondary | ICD-10-CM | POA: Diagnosis not present

## 2021-05-03 DIAGNOSIS — M5441 Lumbago with sciatica, right side: Secondary | ICD-10-CM | POA: Diagnosis not present

## 2021-05-04 DIAGNOSIS — M5441 Lumbago with sciatica, right side: Secondary | ICD-10-CM | POA: Diagnosis not present

## 2021-05-07 DIAGNOSIS — M5441 Lumbago with sciatica, right side: Secondary | ICD-10-CM | POA: Diagnosis not present

## 2021-05-08 DIAGNOSIS — M5441 Lumbago with sciatica, right side: Secondary | ICD-10-CM | POA: Diagnosis not present

## 2021-05-09 DIAGNOSIS — M5441 Lumbago with sciatica, right side: Secondary | ICD-10-CM | POA: Diagnosis not present

## 2021-05-10 DIAGNOSIS — M5441 Lumbago with sciatica, right side: Secondary | ICD-10-CM | POA: Diagnosis not present

## 2021-05-11 DIAGNOSIS — M5441 Lumbago with sciatica, right side: Secondary | ICD-10-CM | POA: Diagnosis not present

## 2021-05-14 DIAGNOSIS — M5441 Lumbago with sciatica, right side: Secondary | ICD-10-CM | POA: Diagnosis not present

## 2021-05-15 DIAGNOSIS — M5441 Lumbago with sciatica, right side: Secondary | ICD-10-CM | POA: Diagnosis not present

## 2021-05-16 DIAGNOSIS — M5441 Lumbago with sciatica, right side: Secondary | ICD-10-CM | POA: Diagnosis not present

## 2021-05-17 DIAGNOSIS — M5441 Lumbago with sciatica, right side: Secondary | ICD-10-CM | POA: Diagnosis not present

## 2021-05-18 DIAGNOSIS — M5441 Lumbago with sciatica, right side: Secondary | ICD-10-CM | POA: Diagnosis not present

## 2021-05-21 DIAGNOSIS — M5441 Lumbago with sciatica, right side: Secondary | ICD-10-CM | POA: Diagnosis not present

## 2021-05-22 DIAGNOSIS — M5441 Lumbago with sciatica, right side: Secondary | ICD-10-CM | POA: Diagnosis not present

## 2021-05-23 DIAGNOSIS — M5441 Lumbago with sciatica, right side: Secondary | ICD-10-CM | POA: Diagnosis not present

## 2021-05-24 DIAGNOSIS — M5441 Lumbago with sciatica, right side: Secondary | ICD-10-CM | POA: Diagnosis not present

## 2021-05-25 DIAGNOSIS — M5441 Lumbago with sciatica, right side: Secondary | ICD-10-CM | POA: Diagnosis not present

## 2021-05-28 DIAGNOSIS — M5441 Lumbago with sciatica, right side: Secondary | ICD-10-CM | POA: Diagnosis not present

## 2021-05-29 DIAGNOSIS — M5441 Lumbago with sciatica, right side: Secondary | ICD-10-CM | POA: Diagnosis not present

## 2021-05-30 DIAGNOSIS — M5441 Lumbago with sciatica, right side: Secondary | ICD-10-CM | POA: Diagnosis not present

## 2021-05-31 DIAGNOSIS — M5441 Lumbago with sciatica, right side: Secondary | ICD-10-CM | POA: Diagnosis not present

## 2021-06-01 DIAGNOSIS — M5441 Lumbago with sciatica, right side: Secondary | ICD-10-CM | POA: Diagnosis not present

## 2021-06-03 ENCOUNTER — Other Ambulatory Visit: Payer: Self-pay | Admitting: Family Medicine

## 2021-06-04 DIAGNOSIS — M5441 Lumbago with sciatica, right side: Secondary | ICD-10-CM | POA: Diagnosis not present

## 2021-06-05 DIAGNOSIS — M5441 Lumbago with sciatica, right side: Secondary | ICD-10-CM | POA: Diagnosis not present

## 2021-06-06 ENCOUNTER — Telehealth: Payer: Self-pay

## 2021-06-06 DIAGNOSIS — M5441 Lumbago with sciatica, right side: Secondary | ICD-10-CM | POA: Diagnosis not present

## 2021-06-06 NOTE — Telephone Encounter (Signed)
[employer] case manager LVM on nurse line requesting a shower chair for patient.  ? ?Will forward to PCP. Please let me know when this order is placed.  ? ? ?

## 2021-06-07 ENCOUNTER — Other Ambulatory Visit: Payer: Self-pay | Admitting: Family Medicine

## 2021-06-07 DIAGNOSIS — M5441 Lumbago with sciatica, right side: Secondary | ICD-10-CM | POA: Diagnosis not present

## 2021-06-08 DIAGNOSIS — M5441 Lumbago with sciatica, right side: Secondary | ICD-10-CM | POA: Diagnosis not present
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
## 2021-06-11 DIAGNOSIS — M5441 Lumbago with sciatica, right side: Secondary | ICD-10-CM | POA: Diagnosis not present

## 2021-06-12 DIAGNOSIS — M5441 Lumbago with sciatica, right side: Secondary | ICD-10-CM | POA: Diagnosis not present

## 2021-06-13 DIAGNOSIS — M5441 Lumbago with sciatica, right side: Secondary | ICD-10-CM | POA: Diagnosis not present

## 2021-06-14 DIAGNOSIS — M5441 Lumbago with sciatica, right side: Secondary | ICD-10-CM | POA: Diagnosis not present

## 2021-06-15 DIAGNOSIS — M5441 Lumbago with sciatica, right side: Secondary | ICD-10-CM | POA: Diagnosis not present

## 2021-06-18 DIAGNOSIS — M5441 Lumbago with sciatica, right side: Secondary | ICD-10-CM | POA: Diagnosis not present

## 2021-06-19 DIAGNOSIS — M5441 Lumbago with sciatica, right side: Secondary | ICD-10-CM | POA: Diagnosis not present

## 2021-06-20 DIAGNOSIS — M5441 Lumbago with sciatica, right side: Secondary | ICD-10-CM | POA: Diagnosis not present

## 2021-06-21 DIAGNOSIS — M5441 Lumbago with sciatica, right side: Secondary | ICD-10-CM | POA: Diagnosis not present

## 2021-06-22 DIAGNOSIS — M5441 Lumbago with sciatica, right side: Secondary | ICD-10-CM | POA: Diagnosis not present

## 2021-06-25 DIAGNOSIS — M5441 Lumbago with sciatica, right side: Secondary | ICD-10-CM | POA: Diagnosis not present

## 2021-06-26 DIAGNOSIS — M5441 Lumbago with sciatica, right side: Secondary | ICD-10-CM | POA: Diagnosis not present

## 2021-06-27 DIAGNOSIS — M5441 Lumbago with sciatica, right side: Secondary | ICD-10-CM | POA: Diagnosis not present

## 2021-06-28 DIAGNOSIS — M5441 Lumbago with sciatica, right side: Secondary | ICD-10-CM | POA: Diagnosis not present

## 2021-06-28 NOTE — Telephone Encounter (Signed)
[name] confirms she received order.  They will process.  Jone Baseman, CMA ? ?

## 2021-06-28 NOTE — Telephone Encounter (Signed)
Insurance company calls back to check status.   ? ?Community message sent to [employer], [employer], [place] and Santina Evans @ [facility] to process DME order for shower chair.  ? ?Jone Baseman, CMA ? ?

## 2021-06-29 DIAGNOSIS — M5441 Lumbago with sciatica, right side: Secondary | ICD-10-CM | POA: Diagnosis not present

## 2021-06-29 DIAGNOSIS — M545 Low back pain, unspecified: Secondary | ICD-10-CM | POA: Diagnosis not present

## 2021-07-02 DIAGNOSIS — M5441 Lumbago with sciatica, right side: Secondary | ICD-10-CM | POA: Diagnosis not present

## 2021-07-03 DIAGNOSIS — M5441 Lumbago with sciatica, right side: Secondary | ICD-10-CM | POA: Diagnosis not present

## 2021-07-04 DIAGNOSIS — M5441 Lumbago with sciatica, right side: Secondary | ICD-10-CM | POA: Diagnosis not present

## 2021-07-05 DIAGNOSIS — M5441 Lumbago with sciatica, right side: Secondary | ICD-10-CM | POA: Diagnosis not present

## 2021-07-06 DIAGNOSIS — M5441 Lumbago with sciatica, right side: Secondary | ICD-10-CM | POA: Diagnosis not present

## 2021-07-09 DIAGNOSIS — M5441 Lumbago with sciatica, right side: Secondary | ICD-10-CM | POA: Diagnosis not present

## 2021-07-10 DIAGNOSIS — M5441 Lumbago with sciatica, right side: Secondary | ICD-10-CM | POA: Diagnosis not present

## 2021-07-11 DIAGNOSIS — M5441 Lumbago with sciatica, right side: Secondary | ICD-10-CM | POA: Diagnosis not present

## 2021-07-12 DIAGNOSIS — M5441 Lumbago with sciatica, right side: Secondary | ICD-10-CM | POA: Diagnosis not present

## 2021-07-13 DIAGNOSIS — M5441 Lumbago with sciatica, right side: Secondary | ICD-10-CM | POA: Diagnosis not present

## 2021-07-16 DIAGNOSIS — M5441 Lumbago with sciatica, right side: Secondary | ICD-10-CM | POA: Diagnosis not present

## 2021-07-17 DIAGNOSIS — M5441 Lumbago with sciatica, right side: Secondary | ICD-10-CM | POA: Diagnosis not present

## 2021-07-18 DIAGNOSIS — M5441 Lumbago with sciatica, right side: Secondary | ICD-10-CM | POA: Diagnosis not present

## 2021-07-19 DIAGNOSIS — F3181 Bipolar II disorder: Secondary | ICD-10-CM | POA: Diagnosis not present

## 2021-07-19 DIAGNOSIS — M5441 Lumbago with sciatica, right side: Secondary | ICD-10-CM | POA: Diagnosis not present

## 2021-07-19 DIAGNOSIS — Z79899 Other long term (current) drug therapy: Secondary | ICD-10-CM | POA: Diagnosis not present

## 2021-07-20 DIAGNOSIS — M5441 Lumbago with sciatica, right side: Secondary | ICD-10-CM | POA: Diagnosis not present

## 2021-07-23 DIAGNOSIS — M5441 Lumbago with sciatica, right side: Secondary | ICD-10-CM | POA: Diagnosis not present

## 2021-07-24 DIAGNOSIS — M5441 Lumbago with sciatica, right side: Secondary | ICD-10-CM | POA: Diagnosis not present

## 2021-07-25 DIAGNOSIS — M5441 Lumbago with sciatica, right side: Secondary | ICD-10-CM | POA: Diagnosis not present

## 2021-07-26 ENCOUNTER — Other Ambulatory Visit: Payer: Self-pay | Admitting: Physical Medicine & Rehabilitation

## 2021-07-26 DIAGNOSIS — M5441 Lumbago with sciatica, right side: Secondary | ICD-10-CM | POA: Diagnosis not present

## 2021-07-30 ENCOUNTER — Ambulatory Visit (INDEPENDENT_AMBULATORY_CARE_PROVIDER_SITE_OTHER): Payer: Medicaid Other | Admitting: Family Medicine

## 2021-07-30 ENCOUNTER — Encounter: Payer: Self-pay | Admitting: Family Medicine

## 2021-07-30 VITALS — BP 120/78 | HR 97 | Ht 75.0 in | Wt 255.2 lb

## 2021-07-30 DIAGNOSIS — R030 Elevated blood-pressure reading, without diagnosis of hypertension: Secondary | ICD-10-CM | POA: Diagnosis not present

## 2021-07-30 DIAGNOSIS — L732 Hidradenitis suppurativa: Secondary | ICD-10-CM

## 2021-07-30 MED ORDER — DOXYCYCLINE HYCLATE 100 MG PO TABS: 100.0000 mg | ORAL_TABLET | Freq: Two times a day (BID) | ORAL | 0 refills | Status: AC

## 2021-07-30 NOTE — Patient Instructions (Addendum)
Thank you for coming to see me today. It was a pleasure.  ? ?Warm compresses 3-4 4 times daily to keep the area open and draining ?Prescription sent for doxycycline 100 mg twice daily.  Take medication for 14 days ? ? ?Please follow-up with PCP in 3-4 weeks ? ?If you have any questions or concerns, please do not hesitate to call the office at [phone]. ? ?Best,  ? ?Dana Allan, MD   ? ?Hidradenitis Suppurativa ?Hidradenitis suppurativa is a long-term (chronic) skin disease. It is similar to a severe form of acne, but it affects areas of the body where acne would be unusual, especially areas of the body where skin rubs against skin and becomes moist. These include: ?Underarms. ?Groin. ?Genital area. ?Buttocks. ?Upper thighs. ?Breasts. ?Hidradenitis suppurativa may start out as small lumps or pimples caused by blocked sweat glands or hair follicles. Pimples may develop into deep sores that break open (rupture) and drain pus. Over time, affected areas of skin may thicken and become scarred. This condition is rare and does not spread from person to person (non-contagious). ?What are the causes? ?The exact cause of this condition is not known. It may be related to: ?Female and female hormones. ?An overactive disease-fighting system (immune system). The immune system may over-react to blocked hair follicles or sweat glands and cause swelling and pus-filled sores. ?What increases the risk? ?You are more likely to develop this condition if you: ?Are female. ?Are 43-77 years old. ?Have a family history of hidradenitis suppurativa. ?Have a personal history of acne. ?Are overweight. ?Smoke. ?Take the medicine lithium. ?What are the signs or symptoms? ?The first symptoms are usually painful bumps in the skin, similar to pimples. The condition may get worse over time (progress), or it may only cause mild symptoms. If the disease progresses, symptoms may include: ?Skin bumps getting bigger and growing deeper into the  skin. ?Bumps rupturing and draining pus. ?Itchy, infected skin. ?Skin getting thicker and scarred. ?Tunnels under the skin (fistulas) where pus drains from a bump. ?Pain during daily activities, such as pain during walking if your groin area is affected. ?Emotional problems, such as stress or depression. This condition may affect your appearance and your ability or willingness to wear certain clothes or do certain activities. ?How is this diagnosed? ?This condition is diagnosed by a health care provider who specializes in skin diseases (dermatologist). You may be diagnosed based on: ?Your symptoms and medical history. ?A physical exam. ?Testing a pus sample for infection. ?Blood tests. ?How is this treated? ?Your treatment will depend on how severe your symptoms are. The same treatment will not work for everybody with this condition. You may need to try several treatments to find what works best for you. Treatment may include: ?Cleaning and bandaging (dressing) your wounds as needed. ?Lifestyle changes, such as new skin care routines. ?Taking medicines, such as: ?Antibiotics. ?Acne medicines. ?Medicines to reduce the activity of the immune system. ?A diabetes medicine (metformin). ?Birth control pills, for women. ?Steroids to reduce swelling and pain. ?Working with a mental health care provider, if you experience emotional distress due to this condition. ?If you have severe symptoms that do not get better with medicine, you may need surgery. Surgery may involve: ?Using a laser to clear the skin and remove hair follicles. ?Opening and draining deep sores. ?Removing the areas of skin that are diseased and scarred. ?Follow these instructions at home: ?Medicines ? ?Take over-the-counter and prescription medicines only as told by your health  care provider. ?If you were prescribed an antibiotic medicine, take it as told by your health care provider. Do not stop taking the antibiotic even if your condition improves. ?Skin  care ?If you have open wounds, cover them with a clean dressing as told by your health care provider. Keep wounds clean by washing them gently with soap and water when you bathe. ?Do not shave the areas where you get hidradenitis suppurativa. ?Do not wear deodorant. ?Wear loose-fitting clothes. ?Try to avoid getting overheated or sweaty. If you get sweaty or wet, change into clean, dry clothes as soon as you can. ?To help relieve pain and itchiness, cover sore areas with a warm, clean washcloth (warm compress) for 5-10 minutes as often as needed. ?If told by your health care provider, take a bleach bath twice a week: ?Fill your bathtub halfway with water. ?Pour in ? cup of unscented household bleach. ?Soak in the tub for 5-10 minutes. ?Only soak from the neck down. Avoid water on your face and hair. ?Shower to rinse off the bleach from your skin. ?General instructions ?Learn as much as you can about your disease so that you have an active role in your treatment. Work closely with your health care provider to find treatments that work for you. ?If you are overweight, work with your health care provider to lose weight as recommended. ?Do not use any products that contain nicotine or tobacco, such as cigarettes and e-cigarettes. If you need help quitting, ask your health care provider. ?If you struggle with living with this condition, talk with your health care provider or work with a mental health care provider as recommended. ?Keep all follow-up visits as told by your health care provider. This is important. ?Where to find more information ?Hidradenitis Suppurativa Foundation, Inc.: https://www.hs-foundation.org/ ?American Academy of Dermatology: InstantFinish.fi ?Contact a health care provider if you have: ?A flare-up of hidradenitis suppurativa. ?A fever or chills. ?Trouble controlling your symptoms at home. ?Trouble doing your daily activities because of your symptoms. ?Trouble dealing with emotional problems  related to your condition. ?Summary ?Hidradenitis suppurativa is a long-term (chronic) skin disease. It is similar to a severe form of acne, but it affects areas of the body where acne would be unusual. ?The first symptoms are usually painful bumps in the skin, similar to pimples. The condition may only cause mild symptoms, or it may get worse over time (progress). ?If you have open wounds, cover them with a clean dressing as told by your health care provider. Keep wounds clean by washing them gently with soap and water when you bathe. ?Besides skin care, treatment may include medicines, laser treatment, and surgery. ?This information is not intended to replace advice given to you by your health care provider. Make sure you discuss any questions you have with your health care provider. ?Document Revised: 01/04/2020 Document Reviewed: 01/04/2020 ?Elsevier Patient Education ? 2023 Elsevier Inc. ? ?

## 2021-07-30 NOTE — Progress Notes (Signed)
    SUBJECTIVE:   CHIEF COMPLAINT / HPI: Boil under arm  Patient reports painful boil under right arm started draining yesterday evening.  Denies any fevers.  Has had similar symptoms in past which she has been on antibiotics and also had I&D's.  Runs in family.   PERTINENT  PMH / PSH: Hidradenitis suppurative Hypertension  OBJECTIVE:   BP 120/78   Pulse 97   Ht 6\' 3"  (1.905 m)   Wt 255 lb 3.2 oz (115.8 kg)   SpO2 100%   BMI 31.90 kg/m    General: Alert, no acute distress Cardio: Normal S1 and S2, RRR, no r/m/g Pulm: CTAB, normal work of breathing Right axilla: Visible purulent drainage noted.  Unable to palpate area to assess for fluctuation or size as patient refused.  Visible previous I&D scar noted.   ASSESSMENT/PLAN:   Hidradenitis suppurativa Doxycycline 100 mg twice daily x10 days Warm water compresses 4-5 times daily to allow for continued drainage Strict return precautions provided. Follow-up with PCP in 3 to 4 weeks, sooner if drainage ceases, hardening of area, development of fevers or worsening pain.  Elevated blood pressure reading Likely secondary to pain Repeat blood pressure within normal range Encourage healthy lifestyle  Recheck at next visit.     , MD [facility], [place]

## 2021-07-31 ENCOUNTER — Encounter: Payer: Self-pay | Admitting: Family Medicine

## 2021-07-31 DIAGNOSIS — I10 Essential (primary) hypertension: Secondary | ICD-10-CM | POA: Insufficient documentation

## 2021-07-31 DIAGNOSIS — R030 Elevated blood-pressure reading, without diagnosis of hypertension: Secondary | ICD-10-CM | POA: Insufficient documentation

## 2021-07-31 NOTE — Assessment & Plan Note (Signed)
Doxycycline 100 mg twice daily x10 days ?Warm water compresses 4-5 times daily to allow for continued drainage ?Strict return precautions provided. ?Follow-up with PCP in 3 to 4 weeks, sooner if drainage ceases, hardening of area, development of fevers or worsening pain. ?

## 2021-07-31 NOTE — Assessment & Plan Note (Signed)
>>  ASSESSMENT AND PLAN FOR ELEVATED BLOOD PRESSURE READING WRITTEN ON 07/31/2021  1:11 PM BY Dana Allan, MD  Likely secondary to pain Repeat blood pressure within normal range Encourage healthy lifestyle  Recheck at next visit.

## 2021-07-31 NOTE — Assessment & Plan Note (Addendum)
Likely secondary to pain ?Repeat blood pressure within normal range ?Encourage healthy lifestyle  ?Recheck at next visit. ?

## 2021-08-20 ENCOUNTER — Other Ambulatory Visit: Payer: Self-pay | Admitting: Family Medicine

## 2021-08-28 ENCOUNTER — Encounter: Payer: Self-pay | Admitting: *Deleted

## 2021-09-06 ENCOUNTER — Ambulatory Visit (INDEPENDENT_AMBULATORY_CARE_PROVIDER_SITE_OTHER): Payer: Medicaid Other | Admitting: Family Medicine

## 2021-09-06 ENCOUNTER — Encounter: Payer: Self-pay | Admitting: Family Medicine

## 2021-09-06 VITALS — BP 138/80 | HR 68 | Wt 260.0 lb

## 2021-09-06 DIAGNOSIS — M25561 Pain in right knee: Secondary | ICD-10-CM | POA: Diagnosis not present

## 2021-09-06 DIAGNOSIS — K1379 Other lesions of oral mucosa: Secondary | ICD-10-CM

## 2021-09-06 DIAGNOSIS — M170 Bilateral primary osteoarthritis of knee: Secondary | ICD-10-CM

## 2021-09-06 DIAGNOSIS — G8929 Other chronic pain: Secondary | ICD-10-CM | POA: Diagnosis not present

## 2021-09-06 DIAGNOSIS — M25562 Pain in left knee: Secondary | ICD-10-CM | POA: Diagnosis not present

## 2021-09-06 MED ORDER — AMOXICILLIN 500 MG PO TABS: 500.0000 mg | ORAL_TABLET | Freq: Two times a day (BID) | ORAL | 0 refills | Status: AC

## 2021-09-06 MED ORDER — METHYLPREDNISOLONE ACETATE 40 MG/ML IJ SUSP
40.0000 mg | Freq: Once | INTRAMUSCULAR | Status: AC
  Administered 2021-09-06: 40 mg via INTRAMUSCULAR

## 2021-09-06 NOTE — Progress Notes (Unsigned)
    SUBJECTIVE:   CHIEF COMPLAINT / HPI: knee injections  Patient presents to clinic for bilateral knee injections.  Last injections 04/18/2021.  Was referred to orthopedics at that time for possible bilateral knee surgery evaluation.  Patient reports has not been evaluated as she is going through a law suit currently.  Continues to have pain in both knees.  Denies any fevers.   Tooth pain Patient requesting antibiotics for tooth pain.  Has had history of dental caries and infections in the past.  Treated with antibiotics which resolved.  Patient reports taking ibuprofen without relief.  Has not seen dentist.  Denies any fevers.  PERTINENT  PMH / PSH:  Bilateral knee osteoarthritis  OBJECTIVE:   BP 138/80   Pulse 68   Wt 260 lb (117.9 kg)   SpO2 96%   BMI 32.50 kg/m    General: Alert, no acute distress HEENT: Dental caries noted throughout, tenderness over right upper molar area, mild gingival erythema, with white exudate. Cardio: Normal S1 and S2, RRR, no r/m/g Pulm: CTAB, normal work of breathing Bilateral knee exam: Inspection: No gross deformities, erythema, ecchymosis.  Mild effusion bilaterally.  Small healed abrasion to left anterior knee. ROM: Limited range of motion secondary to pain elicited on extension and flexion. Strength: 5/5 strength Neurovascular: Intact  ASSESSMENT/PLAN:   Arthritis of knee, degenerative After informed written consent timeout was performed, patient was seated on exam table. Left knee was prepped with alcohol swab and utilizing anteromedial approach, patient's left knee was injected intraarticularly with 3:1 bupivicaine: depomedrol. Patient tolerated the procedure well without immediate complications.   After informed written consent timeout was performed, patient was seated on exam table.  Right knee was prepped with alcohol swab and utilizing anteromedial approach, patient's left knee was injected intraarticularly with 3:1 bupivicaine:  depomedrol. Patient tolerated the procedure well without immediate complications.    Follow-up with PCP as needed. Strict return precautions provided.  Mouth pain Low suspicion for dental abscess but given gingival redness and exudate will treat with amoxicillin 500 mg 2 times daily x5 days today. Continue ibuprofen 200 mg 3 times daily for pain as needed. Recommended patient follow-up with dentist as soon as possible for evaluation.     Dana Allan, MD [facility]

## 2021-09-06 NOTE — Patient Instructions (Addendum)
Thank you for coming to see me today. It was a pleasure.   You had both of your knees injected today with steroid. If you develop any drainage, bleeding, fevers please return to clinic  Follow-up with orthopedics for your knee replacement when possible  Recommend you see a dentist for your dental infection.  Please follow-up with PCP as needed  If you have any questions or concerns, please do not hesitate to call the office at [phone].  Best,   Dana Allan, MD

## 2021-09-09 ENCOUNTER — Encounter: Payer: Self-pay | Admitting: Family Medicine

## 2021-09-09 NOTE — Assessment & Plan Note (Addendum)
Low suspicion for dental abscess but given gingival redness and exudate will treat with amoxicillin 500 mg 2 times daily x5 days today. Continue ibuprofen 200 mg 3 times daily for pain as needed. Recommended patient follow-up with dentist as soon as possible for evaluation.

## 2021-09-09 NOTE — Assessment & Plan Note (Signed)
>>  ASSESSMENT AND PLAN FOR KNEE PAIN, RIGHT, CHRONIC WRITTEN ON 02/26/2022  5:39 PM BY [name], MD  >>ASSESSMENT AND PLAN FOR ARTHRITIS OF KNEE, DEGENERATIVE WRITTEN ON 09/09/2021  4:50 PM BY Dana Allan, MD  After informed written consent timeout was performed, patient was seated on exam table. Left knee was prepped with alcohol swab and utilizing anteromedial approach, patient's left knee was injected intraarticularly with 3:1 bupivicaine: depomedrol. Patient tolerated the procedure well without immediate complications.   After informed written consent timeout was performed, patient was seated on exam table.  Right knee was prepped with alcohol swab and utilizing anteromedial approach, patient's left knee was injected intraarticularly with 3:1 bupivicaine: depomedrol. Patient tolerated the procedure well without immediate complications.    Follow-up with PCP as needed. Strict return precautions provided.

## 2021-09-09 NOTE — Assessment & Plan Note (Signed)
>>  ASSESSMENT AND PLAN FOR ARTHRITIS OF KNEE, DEGENERATIVE WRITTEN ON 09/09/2021  4:50 PM BY Dana Allan, MD  After informed written consent timeout was performed, patient was seated on exam table. Left knee was prepped with alcohol swab and utilizing anteromedial approach, patient's left knee was injected intraarticularly with 3:1 bupivicaine: depomedrol. Patient tolerated the procedure well without immediate complications.   After informed written consent timeout was performed, patient was seated on exam table.  Right knee was prepped with alcohol swab and utilizing anteromedial approach, patient's left knee was injected intraarticularly with 3:1 bupivicaine: depomedrol. Patient tolerated the procedure well without immediate complications.    Follow-up with PCP as needed. Strict return precautions provided.

## 2021-09-09 NOTE — Assessment & Plan Note (Signed)
After informed written consent timeout was performed, patient was seated on exam table. Left knee was prepped with alcohol swab and utilizing anteromedial approach, patient's left knee was injected intraarticularly with 3:1 bupivicaine: depomedrol. Patient tolerated the procedure well without immediate complications.   After informed written consent timeout was performed, patient was seated on exam table.  Right knee was prepped with alcohol swab and utilizing anteromedial approach, patient's left knee was injected intraarticularly with 3:1 bupivicaine: depomedrol. Patient tolerated the procedure well without immediate complications.    Follow-up with PCP as needed. Strict return precautions provided.

## 2021-10-15 ENCOUNTER — Other Ambulatory Visit: Payer: Self-pay | Admitting: *Deleted

## 2021-10-15 DIAGNOSIS — K047 Periapical abscess without sinus: Secondary | ICD-10-CM

## 2021-10-19 ENCOUNTER — Other Ambulatory Visit: Payer: Self-pay

## 2021-10-19 DIAGNOSIS — K047 Periapical abscess without sinus: Secondary | ICD-10-CM

## 2021-11-17 ENCOUNTER — Other Ambulatory Visit: Payer: Self-pay

## 2021-11-17 DIAGNOSIS — J449 Chronic obstructive pulmonary disease, unspecified: Secondary | ICD-10-CM

## 2021-11-17 DIAGNOSIS — Z720 Tobacco use: Secondary | ICD-10-CM

## 2021-11-17 MED ORDER — DULERA 200-5 MCG/ACT IN AERO: 2.0000 | INHALATION_SPRAY | Freq: Two times a day (BID) | RESPIRATORY_TRACT | 2 refills | Status: DC

## 2021-11-29 ENCOUNTER — Telehealth: Payer: Self-pay

## 2021-11-29 NOTE — Telephone Encounter (Signed)
Patient calls nurse line requesting to schedule appointment for issues with breathing. She reports that she has been having these issues since mold exposure in September of 2022. Mainly notices "breathing harder" when she is more active. She denies chest pain or SHOB at this time. She is able to speak in complete sentences. She was recently exposed to relative with cold symptoms. She has taken a home COVID test that was negative.   Scheduled patient for next available appointment with Dr. Anner Crete on 9/13. Provided with strict ED precautions.   Veronda Prude, RN

## 2021-12-05 ENCOUNTER — Ambulatory Visit (INDEPENDENT_AMBULATORY_CARE_PROVIDER_SITE_OTHER): Payer: Medicaid Other | Admitting: Family Medicine

## 2021-12-05 ENCOUNTER — Encounter: Payer: Self-pay | Admitting: Family Medicine

## 2021-12-05 VITALS — BP 150/82 | HR 69 | Ht 75.0 in | Wt 249.5 lb

## 2021-12-05 DIAGNOSIS — Z720 Tobacco use: Secondary | ICD-10-CM | POA: Diagnosis not present

## 2021-12-05 DIAGNOSIS — R062 Wheezing: Secondary | ICD-10-CM

## 2021-12-05 DIAGNOSIS — J449 Chronic obstructive pulmonary disease, unspecified: Secondary | ICD-10-CM | POA: Diagnosis not present

## 2021-12-05 DIAGNOSIS — F17200 Nicotine dependence, unspecified, uncomplicated: Secondary | ICD-10-CM

## 2021-12-05 DIAGNOSIS — R0981 Nasal congestion: Secondary | ICD-10-CM | POA: Diagnosis not present

## 2021-12-05 MED ORDER — DULERA 200-5 MCG/ACT IN AERO: 2.0000 | INHALATION_SPRAY | Freq: Two times a day (BID) | RESPIRATORY_TRACT | 2 refills | Status: DC

## 2021-12-05 MED ORDER — ALBUTEROL SULFATE HFA 108 (90 BASE) MCG/ACT IN AERS: 2.0000 | INHALATION_SPRAY | RESPIRATORY_TRACT | 0 refills | Status: DC | PRN

## 2021-12-05 NOTE — Progress Notes (Signed)
    SUBJECTIVE:   CHIEF COMPLAINT / HPI:   Cough -3 weeks ago developed slight runny nose and decreased taste (now resolved) -About 2 weeks ago developed cough -Productive of clear sputum -Also having shortness of breath with exertion and wheezing over the past 1 week -Cough is the most bothersome and is worse at night -Using Mucinex without relief -H/o asthma/COPD overlap -Has been out of her inhalers for quite some time. Previously taking albuterol (prescribed dulera but hadn't been taking consistently) -Smokes 4 cigarettes per day -No fever -No chest pain -+sick contacts  PERTINENT  PMH / PSH: anxiety, bipolar disorder, GERD  OBJECTIVE:   BP (!) 150/82   Pulse 69   Ht 6\' 3"  (1.905 m)   Wt 249 lb 8 oz (113.2 kg)   SpO2 100%   BMI 31.19 kg/m   Gen: NAD, pleasant, able to participate in exam HEENT: [place]/AT, PERRLA, nares patent bilaterally but mildly edematous turbinates, TM normal bilaterally, oropharynx unremarkable Neck: supple, no cervical or supraclavicular lymphadenopathy CV: RRR, normal S1/S2, no murmur Resp: Normal effort, mildly diminished breath sounds throughout, occasional end expiratory wheeze GI: abdomen soft, non-tender, non-distended Extremities: no edema or cyanosis Skin: warm and dry, no rashes noted Neuro: alert, no obvious focal deficits Psych: Normal affect and mood   ASSESSMENT/PLAN:   Asthma-COPD overlap syndrome (HCC) Proven on PFTs in the past. Currently in mild exacerbation. She prefers to avoid oral steroids if possible. -Resume inhalers, Dulera 2 puffs BID and Albuterol prn. Refills sent. Education/counseling provided. -Patient to contact our office if no improvement over the weekend and we will trial short course of prednisone/azithro -Smoking cessation as below  TOBACCO ABUSE Smoking 4 cigarettes daily. Quit successfully in the past with the help of Dr and Chantix. Wishes to speak with him again. Message sent to pharmacy team.     Raymondo Band, MD [facility]

## 2021-12-05 NOTE — Patient Instructions (Addendum)
It was great to see you!  You seem to be having a mild asthma/COPD exacerbation. We will get you back on your inhalers.  Dulera (mometasone-formoterol)-- this is a Psychiatric nurse inhaler. Take this TWICE DAILY. Albuterol- this is a rescue inhaler. Take it only AS NEEDED.  I will call you Monday to check in. If you're not improved we will do oral steroids (prednisone) for 5 days and an antibiotic.  QUIT SMOKING!!!! Dr Raymondo Band will reach out.  Take care, Dr Anner Crete

## 2021-12-06 NOTE — Assessment & Plan Note (Signed)
Proven on PFTs in the past. Currently in mild exacerbation. She prefers to avoid oral steroids if possible. -Resume inhalers, Dulera 2 puffs BID and Albuterol prn. Refills sent. Education/counseling provided. -Patient to contact our office if no improvement over the weekend and we will trial short course of prednisone/azithro -Smoking cessation as below

## 2021-12-06 NOTE — Assessment & Plan Note (Addendum)
Smoking 4 cigarettes daily. Quit successfully in the past with the help of Dr Raymondo Band and Chantix. Wishes to speak with him again. Message sent to pharmacy team.

## 2021-12-10 ENCOUNTER — Telehealth: Payer: Self-pay | Admitting: Family Medicine

## 2021-12-10 DIAGNOSIS — J449 Chronic obstructive pulmonary disease, unspecified: Secondary | ICD-10-CM

## 2021-12-10 NOTE — Telephone Encounter (Signed)
Called patient to check-in on her asthma/COPD exacerbation as a follow-up to our visit on 9/13. No answer. Left voicemail recommending she call the office if she's not improved so I can send Rx for prednisone/azithromycin.

## 2021-12-11 MED ORDER — PREDNISONE 20 MG PO TABS: 40.0000 mg | ORAL_TABLET | Freq: Every day | ORAL | 0 refills | Status: AC

## 2021-12-11 MED ORDER — AZITHROMYCIN 500 MG PO TABS: 500.0000 mg | ORAL_TABLET | Freq: Every day | ORAL | 0 refills | Status: AC

## 2021-12-11 NOTE — Telephone Encounter (Signed)
Patient returns call to nurse line.   Patient reports she would like to move forward with Prednisone/Azithromycin prescription.   Will forward to provider who saw patient.

## 2021-12-11 NOTE — Addendum Note (Signed)
Addended by: Alcus Dad on: 12/11/2021 12:58 PM   Modules accepted: Orders

## 2021-12-11 NOTE — Telephone Encounter (Signed)
Rx sent for prednisone 40mg  daily x5 days and azithromycin 500mg  daily x3 days. Patient informed via telephone.  Alcus Dad, MD PGY-3, [place]

## 2021-12-12 ENCOUNTER — Encounter: Payer: Self-pay | Admitting: Family Medicine

## 2021-12-12 ENCOUNTER — Other Ambulatory Visit: Payer: Self-pay | Admitting: Family Medicine

## 2021-12-13 ENCOUNTER — Ambulatory Visit (INDEPENDENT_AMBULATORY_CARE_PROVIDER_SITE_OTHER): Payer: Medicaid Other | Admitting: Family Medicine

## 2021-12-13 ENCOUNTER — Encounter: Payer: Self-pay | Admitting: Family Medicine

## 2021-12-13 ENCOUNTER — Other Ambulatory Visit: Payer: Self-pay | Admitting: Physical Medicine & Rehabilitation

## 2021-12-13 VITALS — BP 147/86 | HR 71 | Ht 75.0 in | Wt 245.0 lb

## 2021-12-13 DIAGNOSIS — G8929 Other chronic pain: Secondary | ICD-10-CM | POA: Diagnosis not present

## 2021-12-13 DIAGNOSIS — M170 Bilateral primary osteoarthritis of knee: Secondary | ICD-10-CM

## 2021-12-13 DIAGNOSIS — M545 Low back pain, unspecified: Secondary | ICD-10-CM

## 2021-12-13 MED ORDER — METHYLPREDNISOLONE ACETATE 40 MG/ML IJ SUSP
40.0000 mg | Freq: Once | INTRAMUSCULAR | Status: AC
  Administered 2021-12-13: 40 mg via INTRAMUSCULAR

## 2021-12-13 MED ORDER — CYCLOBENZAPRINE HCL 10 MG PO TABS: ORAL_TABLET | ORAL | 0 refills | Status: DC

## 2021-12-13 NOTE — Patient Instructions (Signed)

## 2021-12-14 NOTE — Progress Notes (Signed)
Procedure Diagnosis: Bilateral tricompartmental knee osteoarthritis, symptomatic and interfering with iADL function Operator: Sherren Mocha Sahiti Joswick, MD Procedure: Bilateral knee injection each with 40 mg triamcinolone with 4 ml lidocaine 1% with Epi Consent: Verbal after informing risks of infection and bleeding Site: Lateral approach knee joint on each knee Prep: Betadine and alcohol Material: 40 mg triamcinolone suspension with 4 mL Lidocaine 1% with Epi x 2, 1/2 inch needles Complications: none

## 2021-12-18 ENCOUNTER — Telehealth: Payer: Self-pay | Admitting: Pharmacist

## 2021-12-18 ENCOUNTER — Ambulatory Visit (INDEPENDENT_AMBULATORY_CARE_PROVIDER_SITE_OTHER): Payer: Medicaid Other | Admitting: Student

## 2021-12-18 VITALS — BP 124/86 | HR 65 | Ht 75.0 in | Wt 252.4 lb

## 2021-12-18 DIAGNOSIS — K047 Periapical abscess without sinus: Secondary | ICD-10-CM | POA: Diagnosis present

## 2021-12-18 DIAGNOSIS — F17200 Nicotine dependence, unspecified, uncomplicated: Secondary | ICD-10-CM

## 2021-12-18 MED ORDER — NAPROXEN 500 MG PO TABS: 500.0000 mg | ORAL_TABLET | Freq: Two times a day (BID) | ORAL | 0 refills | Status: DC

## 2021-12-18 MED ORDER — AMOXICILLIN 500 MG PO CAPS: 500.0000 mg | ORAL_CAPSULE | Freq: Three times a day (TID) | ORAL | 0 refills | Status: DC

## 2021-12-18 NOTE — Assessment & Plan Note (Signed)
Patient contacted for follow/up of tobacco intake reduction / cessation attempt.  Follow-up after request from [facility] Provider visit.   Since last contact > 1 year ago patient reports she has moved out of her "mold-infested" apartment.   She is smoking 3-4 cigs per day at this time and plans to quit soon.   Medications currently being used;  None.   Rates IMPORTANCE of quitting tobacco as high and plans to quit prior to her birthday (12/26/2021).   Most common triggers to use tobacco include; Stress - level currently high.

## 2021-12-18 NOTE — Telephone Encounter (Signed)
Noted and agree. 

## 2021-12-18 NOTE — Telephone Encounter (Signed)
-----   Message from Alcus Dad, MD sent at 12/06/2021  9:05 PM EDT ----- Frankey Shown worked with this patient in the past and she wishes to speak with you again for assistance with smoking cessation. Thanks!

## 2021-12-18 NOTE — Patient Instructions (Signed)
It was wonderful to meet you today. Thank you for allowing me to be a part of your care. Below is a short summary of what we discussed at your visit today:  I have sent in prescription of amoxicillin which you should take 3 times daily for at least 7 days for your dental abscess.  I have provided with list of dentist. Please call them to schedule an appointment as might need a dental procedure to avoid continuous reoccurrence of this abscess.   Please bring all of your medications to every appointment!  If you have any questions or concerns, please do not hesitate to contact us via phone or MyChart message.   Alen Bleacher, MD [facility]

## 2021-12-18 NOTE — Telephone Encounter (Signed)
Patient contacted for follow/up of tobacco intake reduction / cessation attempt.  Follow-up after request from [facility] Provider visit.   Since last contact > 1 year ago patient reports she has moved out of her "mold-infested" apartment.   She is smoking 3-4 cigs per day at this time and plans to quit soon.   Medications currently being used;  None.   Rates IMPORTANCE of quitting tobacco as high and plans to quit prior to her birthday (12/26/2021).   Most common triggers to use tobacco include; Stress - level currently high.    Motivation to quit: BREATHING Patient is participating in a Managed Medicaid Plan:  Yes  Total time with patient call and documentation of interaction: 12 minutes. Follow-up phone call planned: 3 weeks.   Patient requested new prescription for prednisone burst which helped her breathing tremendously AND also helped prevent her from smoking.   I shared that I would forward her request to Dr. Rock Nephew, who most recently saw patient and is aware of her breathing.   She states she is going out of town on 10/6-8 and will be at the beach.   She requested a mychart interaction or a return call to discuss.  There were no available visits with Dr. Rock Nephew or Dr. Marcina Millard prior to her trip.

## 2021-12-18 NOTE — Progress Notes (Cosign Needed Addendum)
    SUBJECTIVE:   CHIEF COMPLAINT / HPI:   56 year old female who presents today for concerns of tooth abscess. According to patient's was sleeping and when she woke up and noticed some swellings on her face. Patient endorses pain on her teeth Denies any fever, chills and headache. No recent dental work and report use of tobacco. Its been over a year since she's seen a dentist. Report prior dental abscess that resolved with amoxicillin   PERTINENT  PMH / PSH: Noncontributory  OBJECTIVE:   BP 124/86   Pulse 65   Wt 252 lb 6.4 oz (114.5 kg)   SpO2 98%   BMI 31.55 kg/m     Physical Exam General: Alert, well appearing, NAD HEENT: Normal tympanic membranes bilaterally, mandibular area is soft and nontender.  Has notable multiple missing teeth on the upper molars. 1 cm erythematous nodule just before the first molar on the right Cardiac: RRR, no murmurs    ASSESSMENT/PLAN:   Dental abscess Patient's history and physical exam are concerning for dental abscess.  No concerns for Ludwig angina or systemic infection in the presence of fever or hypotension. Recommend Augmentin however patient declines due to prior experience with diarrhea and success with amoxicllin -Rx amoxicillin 500 mg 3 times daily -Recommend close follow-up with dentist to avoid recurrence of dental abscess -Provided patient with list of Medicaid dentist  Alen Bleacher, MD [place]

## 2021-12-20 NOTE — Telephone Encounter (Signed)
Hey Dr. Rock Nephew!  I saw your note about considering steroid/azithro for this patient. Did you have idea on how long you wanted to treat? I can place an order for Pred 20 x 5d, wasn't sure if needed full course azithro.

## 2021-12-21 ENCOUNTER — Emergency Department (HOSPITAL_COMMUNITY): Payer: Medicaid Other

## 2021-12-21 ENCOUNTER — Emergency Department (HOSPITAL_COMMUNITY)
Admission: EM | Admit: 2021-12-21 | Discharge: 2021-12-22 | Discharge status: Against Medical Advice | Payer: Medicaid Other | Attending: Emergency Medicine | Admitting: Emergency Medicine

## 2021-12-21 ENCOUNTER — Other Ambulatory Visit: Payer: Self-pay

## 2021-12-21 ENCOUNTER — Encounter (HOSPITAL_COMMUNITY): Payer: Self-pay | Admitting: Emergency Medicine

## 2021-12-21 ENCOUNTER — Telehealth: Payer: Self-pay

## 2021-12-21 DIAGNOSIS — X58XXXA Exposure to other specified factors, initial encounter: Secondary | ICD-10-CM | POA: Insufficient documentation

## 2021-12-21 DIAGNOSIS — Z5321 Procedure and treatment not carried out due to patient leaving prior to being seen by health care provider: Secondary | ICD-10-CM | POA: Insufficient documentation

## 2021-12-21 DIAGNOSIS — S91019A Laceration without foreign body, unspecified ankle, initial encounter: Secondary | ICD-10-CM | POA: Diagnosis not present

## 2021-12-21 DIAGNOSIS — S99919A Unspecified injury of unspecified ankle, initial encounter: Secondary | ICD-10-CM | POA: Diagnosis present

## 2021-12-21 MED ORDER — FLUCONAZOLE 150 MG PO TABS: 150.0000 mg | ORAL_TABLET | Freq: Once | ORAL | 1 refills | Status: AC

## 2021-12-21 MED ORDER — PREDNISONE 20 MG PO TABS: 40.0000 mg | ORAL_TABLET | Freq: Every day | ORAL | 0 refills | Status: AC

## 2021-12-21 NOTE — Telephone Encounter (Signed)
Returned patient's call to discuss. States she frequently gets yeast infections with antibiotic use. Will sent Rx for diflucan.  Will also send prednisone 40mg  daily x5 days for asthma/COPD exacerbation as discussed at our most recent office visit. Emphasized risks of frequent steroid use and patient verbalizes understanding. Still feels she would benefit from an additional round of steroids, which is reasonable. She has good compliance with her Dulera and Albuterol prn.   Alcus Dad, MD PGY-3, [place]

## 2021-12-21 NOTE — ED Provider Triage Note (Signed)
Emergency Medicine Provider Triage Evaluation Note  [name] , a 56 y.o. female  was evaluated in triage.  Pt complains of patient was out celebrating her birthday when she looked down and her ankle was bleeding.  She has been drinking.  She states she can even feel it.  She also states "I have lost a lot of blood."  She is able to move her ankle without any abnormalities.  Review of Systems  Positive: Ankle laceration Negative: Weakness  Physical Exam  BP 100/68   Pulse 79   Temp 97.9 F (36.6 C) (Oral)   Resp 18   SpO2 96%  Gen:   Awake, no distress   Resp:  Normal effort  MSK:   Moves extremities without difficulty  Other:  Laceration on the lateral portion of the ankle.  Medical Decision Making  Medically screening exam initiated at 9:15 PM.  Appropriate orders placed.  [name] was informed that the remainder of the evaluation will be completed by another provider, this initial triage assessment does not replace that evaluation, and the importance of remaining in the ED until their evaluation is complete.  Work-up initiated Tdap up-to-date   Margarita Mail, Hershal Coria 12/21/21 2117

## 2021-12-21 NOTE — ED Triage Notes (Signed)
Pt here with laceration to left foot. Pt unsure how she injured it, reports tetanus up to date.

## 2021-12-21 NOTE — Telephone Encounter (Signed)
Patient calls nurse line requesting diflucan for yeast infection.   She reports that she is having vaginal discharge and irritation. She was recently seen for dental abscess and prescribed antibiotics.   She is also asking for a refill on prednisone as she felt that her symptoms have been improving but feels that she needs an additional five days of steroids. She is requesting to speak with Dr. Rock Nephew about this request.   Please advise.   Talbot Grumbling, RN

## 2021-12-22 ENCOUNTER — Encounter (HOSPITAL_COMMUNITY): Payer: Self-pay

## 2021-12-22 ENCOUNTER — Ambulatory Visit (HOSPITAL_COMMUNITY)
Admission: EM | Admit: 2021-12-22 | Discharge: 2021-12-22 | Disposition: A | Discharge status: Home / Self Care | Payer: Medicaid Other | Attending: Emergency Medicine | Admitting: Emergency Medicine

## 2021-12-22 DIAGNOSIS — S91012A Laceration without foreign body, left ankle, initial encounter: Secondary | ICD-10-CM

## 2021-12-22 MED ORDER — CEPHALEXIN 500 MG PO CAPS: 500.0000 mg | ORAL_CAPSULE | Freq: Two times a day (BID) | ORAL | 0 refills | Status: AC

## 2021-12-22 MED ORDER — TRAMADOL HCL 50 MG PO TABS: 50.0000 mg | ORAL_TABLET | Freq: Four times a day (QID) | ORAL | 0 refills | Status: AC | PRN

## 2021-12-22 NOTE — Discharge Instructions (Addendum)
Today we have decided to leave site open and allow it heal with time, you ill have a scar predent once it heals  Cleanse daily with diluted soap and water , pat dry and cover with non stick dressing until wound have closed  X-ray complete in emergency department negative for injury or presence of glass in skin   Take keflex every morning and every evening for 5 days,   May use tramadol every 6 hours for severe pain.   May follow up for any concerns of healing

## 2021-12-22 NOTE — ED Triage Notes (Signed)
Patient cut her foot on glass last night. States she went to the ED last night and they did x-rays. States there was no glass left in the foot. Wound has stopped bleeding.  Patient is not on any blood thinners.

## 2021-12-22 NOTE — ED Notes (Signed)
PT was called for vitals with no answer.

## 2021-12-22 NOTE — ED Provider Notes (Signed)
[facility]    CSN: [id number] Arrival date & time: 12/22/21  1003      History   Chief Complaint Chief Complaint  Patient presents with   Foot Injury   Laceration    HPI [name] is a 56 y.o. female.   Patient presents with laceration to the left ankle occurring 1 day ago between approximately 8 and 10 PM.  Endorses that she was out drinking and looked down and saw that her ankle was bleeding, believes she cut her ankle on glass, endorses that she bled profusely before resolution.  Range of motion is intact.  Able to bear weight.  Was evaluated in the emergency department overnight but left without being seen.  Wound was cleansed in emergency department and wrapped with gauze.  Up-to-date on tetanus.  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   Overview:  Grade II  Last Assessment & Plan:  Relevant Hx: Course: Daily Update: Today's Plan:    Congestion of nasal sinus 08/03/2018   Depression    ECZEMA, ATOPIC DERMATITIS 08/25/2007   Qualifier: Diagnosis of  By: Darylene Price MD, Elta Guadeloupe     GERD (gastroesophageal reflux disease)    Hypopigmented skin lesion 06/22/2019   Impingement syndrome of right shoulder 04/16/2019   IRREGULAR MENSES 11/03/2007   Qualifier: Diagnosis of  By: Nori Riis MD, Clarise Cruz     LOW BACK PAIN, CHRONIC 06/30/2006   Qualifier: Diagnosis of  By: Darylene Price MD, Mark     Osteoarthritis of knee 03/05/2012   OVARIAN CYST 08/12/2007   Qualifier: Diagnosis of  By: Darylene Price MD, Mark     Panic attacks    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   TOBACCO ABUSE 04/13/2008   VARICOSE VEINS, LOWER EXTREMITIES 06/30/2006   Qualifier: Diagnosis of  By: Darylene Price MD, Mark      Patient Active Problem List   Diagnosis Date Noted   Elevated blood pressure reading 07/31/2021   Hyperlipidemia 04/23/2020   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      Home Medications    Prior to Admission medications   Medication Sig Start Date End Date Taking? Authorizing Provider  ALPRAZolam Duanne Moron) 1 MG tablet Take 1 mg by mouth 4 (four) times daily as needed for anxiety. For anxiety   Yes [provider]  cephALEXin (KEFLEX) 500 MG capsule Take 1 capsule (500 mg total) by mouth 2 (two) times daily for 5 days. 12/22/21 12/27/21 Yes Kalianne Fetting R, NP  cyclobenzaprine (FLEXERIL) 10 MG tablet TAKE 1 TABLET(10 MG) BY MOUTH AT BEDTIME 12/13/21  Yes McDiarmid, Blane Ohara, MD  diazepam (VALIUM) 10 MG tablet Take 1 tablet 1/2 hour prior to procedure (MRI) 06/02/20  Yes Kirsteins, Luanna Salk, MD  diclofenac Sodium (VOLTAREN) 1 % GEL Apply 4 g topically 4 (four) times daily. 12/31/19  Yes Kinnie Feil, MD  divalproex (DEPAKOTE) 250 MG DR tablet Take 250 mg by mouth 3 (three) times daily.    Yes [provider]  gabapentin (NEURONTIN) 300 MG capsule TAKE 3 CAPSULES(900 MG) BY MOUTH THREE TIMES DAILY 05/19/20  Yes Carollee Leitz, MD  mometasone-formoterol [facility]) 200-5 MCG/ACT AERO Inhale 2 puffs into the lungs 2 (two) times daily. 12/05/21  Yes Alcus Dad, MD  naproxen (NAPROSYN) 500 MG tablet Take 1 tablet (500 mg total) by mouth 2 (two) times daily with a meal. 12/18/21  Yes Alen Bleacher, MD  pantoprazole (PROTONIX) 40 MG tablet TAKE 1 TABLET(40 MG) BY MOUTH  DAILY 08/21/21  Yes Carollee Leitz, MD  QUEtiapine (SEROQUEL) 100 MG tablet Take 100 mg by mouth at bedtime. 04/18/20  Yes [provider]  rosuvastatin (CRESTOR) 10 MG tablet Take 1 tablet (10 mg total) by mouth daily. 04/21/20  Yes Carollee Leitz, MD  traMADol (ULTRAM) 50 MG tablet Take 1 tablet (50 mg total) by mouth every 6 (six) hours as needed for up to 3 days. 12/22/21 12/25/21 Yes Amandeep Hogston R, NP  VRAYLAR 1.5 MG capsule Take 1.5 mg by mouth daily. 11/13/21  Yes [provider]  albuterol (VENTOLIN HFA) 108 (90 Base) MCG/ACT inhaler Inhale 2 puffs into the lungs every 4 (four) hours as needed for wheezing or shortness of breath. 12/05/21   Alcus Dad, MD  amoxicillin (AMOXIL) 500 MG capsule Take 1 capsule (500 mg total) by mouth 3 (three) times daily. 12/18/21   Alen Bleacher, MD  predniSONE (DELTASONE) 20 MG tablet Take 2 tablets (40 mg total) by mouth daily with breakfast for 5 days. 12/21/21 12/26/21  Alcus Dad, MD    Family History Family History  Problem Relation Age of Onset   Hypertension Mother    Diabetes Mother    Diabetes Father    Hypertension Father     Social History Social History   Tobacco Use   Smoking status: Some Days    Packs/day: 0.50    Years: 36.00    Total pack years: 18.00    Types: Cigarettes    Start date: 03/25/1981    Last attempt to quit: 10/07/2019    Years since quitting: 2.2    Passive exposure: Current   Smokeless tobacco: Never  Vaping Use   Vaping Use: Never used  Substance Use Topics   Alcohol use: Yes   Drug use: Yes    Types: Marijuana     Allergies   Cetirizine & related   Review of Systems Review of Systems  Constitutional: Negative.   Respiratory: Negative.    Cardiovascular: Negative.   Gastrointestinal: Negative.   Skin:  Positive for wound. Negative for color change, pallor and rash.  Neurological: Negative.      Physical Exam Triage Vital Signs ED Triage Vitals  Enc Vitals Group     BP  12/22/21 1023 (!) 154/83     Pulse Rate 12/22/21 1023 63     Resp 12/22/21 1023 16     Temp 12/22/21 1023 98.2 F (36.8 C)     Temp Source 12/22/21 1023 Oral     SpO2 12/22/21 1023 94 %     Weight --      Height --      Head Circumference --      Peak Flow --      Pain Score 12/22/21 1021  10     Pain Loc --      Pain Edu? --      Excl. in [place]? --    No data found.  Updated Vital Signs BP (!) 154/83 (BP Location: Left Arm)   Pulse 63   Temp 98.2 F (36.8 C) (Oral)   Resp 16   SpO2 94%   Visual Acuity Right Eye Distance:   Left Eye Distance:   Bilateral Distance:    Right Eye Near:   Left Eye Near:    Bilateral Near:     Physical Exam Constitutional:      Appearance: Normal appearance.  HENT:     Head: Normocephalic.  Eyes:     Extraocular Movements: Extraocular movements intact.  Pulmonary:     Effort: Pulmonary effort is normal.  Skin:    Comments: 3 x 1-1/2 cm laceration to the anterior lateral left ankle, able to bear weight, range of motion intact, 2+ dorsalis pedis pulse, sensation intact  Neurological:     Mental Status: She is alert and oriented to person, place, and time. Mental status is at baseline.  Psychiatric:        Mood and Affect: Mood normal.        Behavior: Behavior normal.       UC Treatments / Results  Labs (all labs ordered are listed, but only abnormal results are displayed) Labs Reviewed - No data to display  EKG   Radiology DG Ankle Complete Left  Result Date: 12/21/2021 CLINICAL DATA:  Laceration EXAM: LEFT ANKLE COMPLETE - 3+ VIEW COMPARISON:  None Available. FINDINGS: No fracture or malalignment. No radiopaque foreign body in the soft tissues. Ankle mortise is symmetric IMPRESSION: No acute osseous abnormality Electronically Signed   By: Donavan Foil M.D.   On: 12/21/2021 21:48    Procedures Procedures (including critical care time)  Medications Ordered in UC Medications - No data to display  Initial Impression /  Assessment and Plan / UC Course  I have reviewed the triage vital signs and the nursing notes.  Pertinent labs & imaging results that were available during my care of the patient were reviewed by me and considered in my medical decision making (see chart for details).  Laceration of left ankle, initial encounter  Wound cleansed and office by nursing staff with use of chlorhexidine, iodine, bacitracin applied, covered with a nonadherent, wound bed is wide, able to loosely approximate edges will not be able to do a close closure, discussed with patient, discussed scarring that will occur, in agreement to leave wound open and allow it to heal, Keflex prescribed prophylactically and tramadol prescribed for pain management, PDMP reviewed, low risk, advised daily cleansing with diluted soapy water, patting dry and covering with a nonadherent until wound has closed, recommended follow-up with urgent care for concerns of healing Final Clinical Impressions(s) / UC Diagnoses   Final diagnoses:  Laceration of left ankle, initial encounter     Discharge Instructions      Today we have decided to leave site open and allow it heal with time, you ill have a scar predent once it heals  Cleanse daily with diluted soap and water , pat dry and cover with non stick dressing until wound have closed  X-ray complete in emergency department negative for injury or presence of glass in skin   Take keflex every morning and every evening for 5 days,   May use tramadol every 6 hours for severe pain.   May  follow up for any concerns of healing   ED Prescriptions     Medication Sig Dispense Auth. Provider   cephALEXin (KEFLEX) 500 MG capsule Take 1 capsule (500 mg total) by mouth 2 (two) times daily for 5 days. 10 capsule Lillyan Hitson R, NP   traMADol (ULTRAM) 50 MG tablet Take 1 tablet (50 mg total) by mouth every 6 (six) hours as needed for up to 3 days. 12 tablet Khalia Gong, Leitha Schuller, NP      I have  reviewed the PDMP during this encounter.   Hans Eden, NP 12/22/21 1048

## 2021-12-27 ENCOUNTER — Telehealth: Payer: Self-pay

## 2021-12-27 NOTE — Telephone Encounter (Signed)
Cheryl patients Case Manager LVM on nurse in regards to 351 form.   She reports this was faxed over last week for patient to continue to received personal care services.   Form is in PCP box for review.

## 2021-12-28 NOTE — Telephone Encounter (Signed)
Called patient and she will discuss paperwork and follow up for laceration on ankle at same time.  She will check calendar and call back to schedule an appointment.  Ozella Almond, [place]

## 2021-12-28 NOTE — Telephone Encounter (Signed)
-----   Message from Holley Bouche, MD sent at 12/27/2021  4:18 PM EDT ----- Regarding: [place] paperwork Hey team!  Can we reach out to this patient and have them come in to discuss filling out paperwork for [place]. It looks like she needs to be seen to discuss this, so I can see exactly what she needs, and why she needs it.  Thank you!  -BS

## 2022-01-07 ENCOUNTER — Telehealth: Payer: Self-pay | Admitting: Pharmacist

## 2022-01-07 DIAGNOSIS — F17200 Nicotine dependence, unspecified, uncomplicated: Secondary | ICD-10-CM

## 2022-01-07 NOTE — Telephone Encounter (Signed)
-----   Message from Leavy Cella, [place] sent at 12/18/2021 10:15 AM EDT ----- Regarding: Tobacco Cessation F/u Quit 10/4 ?

## 2022-01-07 NOTE — Assessment & Plan Note (Signed)
Patient contacted for follow/up of tobacco intake reduction / cessation attempt.   Since last contact patient reports smoking ~ 2 cigarettes per day  Medications currently being used; None.  However, patient reports that her brother quit with the use of an orange pill.  I identified this as bupropion and agreed with her that this medication could help, however we would likely contact and share that thought with her mental health provider.  I encouraged her to focus on quitting as she is very close to complete cessation.   She shared that she would inquire if bupropion would be OK for her with her mental health prescriber.  She noted that she has an appointment with her PCP here in the [facility] later this week (Thursday).    Patient denies any significant side effects from tobacco cessation therapy.   Continues to rates IMPORTANCE of quitting tobacco as high.  Continues to rate CONFIDENCE of quitting tobacco as high.   Most comon triggers to use tobacco include; habit.

## 2022-01-07 NOTE — Telephone Encounter (Signed)
Noted and agree. 

## 2022-01-07 NOTE — Telephone Encounter (Signed)
Patient contacted for follow/up of tobacco intake reduction / cessation attempt.   Since last contact patient reports smoking ~ 2 cigarettes per day  Medications currently being used; None.  However, patient reports that her brother quit with the use of an orange pill.  I identified this as bupropion and agreed with her that this medication could help, however we would likely contact and share that thought with her mental health provider.  I encouraged her to focus on quitting as she is very close to complete cessation.   She shared that she would inquire if bupropion would be OK for her with her mental health prescriber.  She noted that she has an appointment with her PCP here in the [facility] later this week (Thursday).    Patient denies any significant side effects from tobacco cessation therapy.   Continues to rates IMPORTANCE of quitting tobacco as high.  Continues to rate CONFIDENCE of quitting tobacco as high.   Most comon triggers to use tobacco include; habit.   Motivation to quit: breathing Patient is participating in a Managed Medicaid Plan:  Yes  Total time with patient call and documentation of interaction: 9 minutes. Follow-up phone call planned: 2 weeks to assess for complete cessation.

## 2022-01-10 ENCOUNTER — Ambulatory Visit: Payer: Medicaid Other | Admitting: Student

## 2022-01-10 NOTE — Progress Notes (Deleted)
  SUBJECTIVE:   CHIEF COMPLAINT / HPI:   Personal Care Services/ Knee pain Patient has hx of chronic knee pain secondary to osteoarthritis.  Imaging has shown moderate to advanced tricompartmental degenerative osteoarthrosis, w/ multiple scattered intra-articular loose bodies on 12/18/19. Patient has recieved multiple knee injections with some frequency.    Foot Laceration Seen 12/22/21 for foot laceration, given ppx 5 days keflex and 12 pills tramadol.   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   Overview:  Grade II  Last Assessment & Plan:  Relevant Hx: Course: Daily Update: Today's Plan:    Congestion of nasal sinus 08/03/2018   Depression    ECZEMA, ATOPIC DERMATITIS 08/25/2007   Qualifier: Diagnosis of  By: Darylene Price MD, Mark     GERD (gastroesophageal reflux disease)    Hypopigmented skin lesion 06/22/2019   Impingement syndrome of right shoulder 04/16/2019   IRREGULAR MENSES 11/03/2007   Qualifier: Diagnosis of  By: Nori Riis MD, Clarise Cruz     LOW BACK PAIN, CHRONIC 06/30/2006   Qualifier: Diagnosis of  By: Darylene Price MD, Mark     Osteoarthritis of knee 03/05/2012   OVARIAN CYST 08/12/2007   Qualifier: Diagnosis of  By: Darylene Price MD, Mark     Panic attacks    Plica syndrome [id number]/8828   TOBACCO ABUSE 04/13/2008   VARICOSE VEINS, LOWER EXTREMITIES 06/30/2006   Qualifier: Diagnosis of  By: Darylene Price MD, Mark      OBJECTIVE:  There were no vitals taken for this visit. Physical Exam   ASSESSMENT/PLAN:  There are no diagnoses linked to this encounter. No follow-ups on file. Holley Bouche, MD 01/10/2022, 7:18 AM PGY-***, [facility] {    This will disappear when note is signed, click to select method of visit    :1}

## 2022-01-11 ENCOUNTER — Ambulatory Visit (INDEPENDENT_AMBULATORY_CARE_PROVIDER_SITE_OTHER): Payer: Medicaid Other | Admitting: Student

## 2022-01-11 VITALS — BP 152/85 | HR 70 | Ht 75.0 in | Wt 250.8 lb

## 2022-01-11 DIAGNOSIS — S91012D Laceration without foreign body, left ankle, subsequent encounter: Secondary | ICD-10-CM

## 2022-01-11 DIAGNOSIS — M25561 Pain in right knee: Secondary | ICD-10-CM | POA: Diagnosis not present

## 2022-01-11 DIAGNOSIS — S91012A Laceration without foreign body, left ankle, initial encounter: Secondary | ICD-10-CM | POA: Insufficient documentation

## 2022-01-11 NOTE — Progress Notes (Signed)
  SUBJECTIVE:   CHIEF COMPLAINT / HPI:   Left ankle laceration: Wound 12/22/21, seen in ED ang given 5 days of Keflex 500 and 12 pills tramadol.  Right Knee OA Patient with DG Knee from 2021 showing moderate to advanced tricompartmental degenerative osteoarthrosis with a few scattered intra-articular loose bodies, and seen with some frequency for knee injections. Patient repots issues falling, from pain and balance issues. Patient falling around 3 times a week and reports using a walker/seat. Patient also wanting knee replacement when she can get it. She's attempting to get through a legal battle first.    PERTINENT  PMH / PSH: OA    OBJECTIVE:  BP (!) 152/85   Pulse 70   Ht 6\' 3"  (1.905 m)   Wt 250 lb 12.8 oz (113.8 kg)   SpO2 100%   BMI 31.35 kg/m  Left ankle: open wound w/o erythema, pus or drainage, minnimal swelling and no tenderness. Pale base of wound.      ASSESSMENT/PLAN:  Laceration of left ankle, subsequent encounter Assessment & Plan: Patient with laceration of left ankle since 9/30. Laceration was not closed due to time patient presented for care. Patient waited a day to be seen, after leaving ED on initial encounter 2/2 to wait times. Patient wound is healing well, no signs of infection or tenderness. Patient providing wound care daily and instructed to keep Vaseline on wound with bandage. Told patient to seek care if wound worsens, drains pus, or pain/swelling.    Arthralgia of right lower leg Assessment & Plan: Patient continues to have knee pain, from her OA. She notes that she's also having balance issues from the knee instability. Patient wants to proceed with knee replacement but it awaiting a legal battle. Patient hopes to have surgery at begin of next year. Given patient's issues with knee pain, and stability, patient will continue to need personal care services.     No follow-ups on file. Holley Bouche, MD 01/11/2022, 1:04 PM PGY-2, [place]

## 2022-01-11 NOTE — Patient Instructions (Signed)
It was great to see you! Thank you for allowing me to participate in your care!  The wound on your foot looks like it's healing well. Continue to place Vaseline on wound before placing bandage. The paperwork has been filled out for your home services, for your knee arthritis.   -Please make an appointment to discuss your health maintenance, in the near future.   Take care and seek immediate care sooner if you develop any concerns.   Dr. Holley Bouche, MD [place]

## 2022-01-11 NOTE — Assessment & Plan Note (Addendum)
Patient continues to have knee pain, from her OA. She notes that she's also having balance issues from the knee instability. Patient wants to proceed with knee replacement but it awaiting a legal battle. Patient hopes to have surgery at begin of next year. Given patient's issues with knee pain, and stability, patient will continue to need personal care services.

## 2022-01-11 NOTE — Assessment & Plan Note (Addendum)
Patient with laceration of left ankle since 9/30. Laceration was not closed due to time patient presented for care. Patient waited a day to be seen, after leaving ED on initial encounter 2/2 to wait times. Patient wound is healing well, no signs of infection or tenderness. Patient providing wound care daily and instructed to keep Vaseline on wound with bandage. Told patient to seek care if wound worsens, drains pus, or pain/swelling.

## 2022-01-11 NOTE — Assessment & Plan Note (Signed)
>>  ASSESSMENT AND PLAN FOR KNEE PAIN, RIGHT, CHRONIC WRITTEN ON 01/11/2022  1:04 PM BY [name], MD  Patient continues to have knee pain, from her OA. She notes that she's also having balance issues from the knee instability. Patient wants to proceed with knee replacement but it awaiting a legal battle. Patient hopes to have surgery at begin of next year. Given patient's issues with knee pain, and stability, patient will continue to need personal care services.

## 2022-02-08 ENCOUNTER — Other Ambulatory Visit: Payer: Self-pay | Admitting: Pharmacist

## 2022-02-08 ENCOUNTER — Telehealth: Payer: Self-pay | Admitting: Pharmacist

## 2022-02-08 MED ORDER — VARENICLINE TARTRATE 1 MG PO TABS: 1.0000 mg | ORAL_TABLET | Freq: Two times a day (BID) | ORAL | 1 refills | Status: DC

## 2022-02-08 NOTE — Telephone Encounter (Signed)
-----   Message from Kathrin Ruddy, RPH-CPP sent at 01/07/2022  3:41 PM EDT ----- Regarding: Completely quit? Smoking only 2 per day on 01/07/2022

## 2022-02-08 NOTE — Telephone Encounter (Signed)
Patient contacted for follow/up of tobacco intake reduction / cessation attempt.   Since last contact patient reports smoking only 1 per day and is only smoking some days of the week.  She reports restarting Varenicline 1mg  tablet.   Medications currently being used;  Varenicline - 1mg  QOD  Patient denies any significant side effects from tobacco cessation therapy.   Continues to rates IMPORTANCE of quitting tobacco as high.  Rates CONFIDENCE of quitting tobacco by the end of the year (in 6 weeks) on 1-10 scale of 8.  Most common triggers to use tobacco include; STRESS   Motivation to quit: "Breathing" Patient is participating in a Managed Medicaid Plan:  Yes We agreed on a plan to increase varenicline dosing frequency (daily for the next two weeks).  Then we will consider dose frequency increase.  New prescription provided.    Total time with patient call and documentation of interaction: 14 minutes. Follow-up phone call planned: 2 weeks

## 2022-02-22 ENCOUNTER — Telehealth: Payer: Self-pay

## 2022-02-22 ENCOUNTER — Telehealth: Payer: Self-pay | Admitting: Student

## 2022-02-22 DIAGNOSIS — K047 Periapical abscess without sinus: Secondary | ICD-10-CM

## 2022-02-22 DIAGNOSIS — G8929 Other chronic pain: Secondary | ICD-10-CM

## 2022-02-22 MED ORDER — CYCLOBENZAPRINE HCL 10 MG PO TABS: ORAL_TABLET | ORAL | 0 refills | Status: DC

## 2022-02-22 MED ORDER — NAPROXEN 500 MG PO TABS: 500.0000 mg | ORAL_TABLET | Freq: Two times a day (BID) | ORAL | 0 refills | Status: DC

## 2022-02-22 NOTE — Telephone Encounter (Signed)
Patient calling complaining of worse knee pain and swelling behind knee. Patient also reports she's been falling from her knee instabillity. Patient has appointment next week Tuesday. Patient also complaining of worsening back pain.   Patient was requesting refill of muscle relaxer for back pain and NSAID for knee.   Told patient I'd give her a short rx to make it to the office appointment.  Orders placed with supply to make it to visit.

## 2022-02-22 NOTE — Telephone Encounter (Signed)
Patient calls nurse line requesting an apt for a knee injection.   Patients last knee injection was 9/21. Unsure if too soon for another one. I scheduled patient for 12/5 for possible injection or discuss pain management in the meantime.   Patient is requesting a muscle relaxer for the weekend.   Will forward to PCP.

## 2022-02-25 ENCOUNTER — Other Ambulatory Visit: Payer: Self-pay | Admitting: Pharmacist

## 2022-02-25 ENCOUNTER — Telehealth: Payer: Self-pay | Admitting: Family Medicine

## 2022-02-25 ENCOUNTER — Telehealth: Payer: Self-pay | Admitting: Pharmacist

## 2022-02-25 DIAGNOSIS — F17200 Nicotine dependence, unspecified, uncomplicated: Secondary | ICD-10-CM

## 2022-02-25 MED ORDER — VARENICLINE TARTRATE 1 MG PO TABS: 1.0000 mg | ORAL_TABLET | Freq: Two times a day (BID) | ORAL | 1 refills | Status: DC

## 2022-02-25 NOTE — Assessment & Plan Note (Signed)
Patient contacted for follow/up of tobacco intake reduction / cessation attempt.   Since last contact patient reports she has NOT smoked for ~ 1 week during her recent illness.  She also complains that the back of her knee is giving her pain.  She has an appointment for this to be evaluated.   Tobacco Cessaton Medications currently being used :  None She has used Varenicline in the past and requests more.   I agreed to help provide varenicline.  Reviewed need to take with food.    Patient denies any significant side effects from tobacco cessation therapy.   Continues to rates IMPORTANCE of quitting tobacco as high.  Rates CONFIDENCE of staying quit from tobacco on 1-10 scale of 8 for the next two weeks.   Most common triggers to use tobacco include; stress, pain.

## 2022-02-25 NOTE — Telephone Encounter (Signed)
-----   Message from Kathrin Ruddy, RPH-CPP sent at 02/08/2022 11:03 AM EST ----- Regarding: Quitting by end of year Now taking varenicline daily?

## 2022-02-25 NOTE — Telephone Encounter (Signed)
Patient contacted for follow/up of tobacco intake reduction / cessation attempt.   Since last contact patient reports she has NOT smoked for ~ 1 week during her recent illness.  She also complains that the back of her knee is giving her pain.  She has an appointment for this to be evaluated.   Tobacco Cessaton Medications currently being used :  None She has used Varenicline in the past and requests more.   I agreed to help provide varenicline.  Reviewed need to take with food.    Patient denies any significant side effects from tobacco cessation therapy.   Continues to rates IMPORTANCE of quitting tobacco as high.  Rates CONFIDENCE of staying quit from tobacco on 1-10 scale of 8 for the next two weeks.   Most common triggers to use tobacco include; stress, pain.   Motivation to quit: Breathing Patient is participating in a Managed Medicaid Plan:  Yes  Total time with patient call and documentation of interaction: 13 minutes. Follow-up phone call planned: 4 weeks

## 2022-02-25 NOTE — Telephone Encounter (Signed)
**  After Hours/ Emergency Line Call**  Patient calls after-hours emergency line requesting refill on her acid reflux medication (Protonix 40mg  daily).  Informed patient I would send message to PCP regarding this request.  , MD PGY-3, [facility] 02/25/2022 6:43 PM

## 2022-02-26 ENCOUNTER — Encounter: Payer: Self-pay | Admitting: Student

## 2022-02-26 ENCOUNTER — Ambulatory Visit (INDEPENDENT_AMBULATORY_CARE_PROVIDER_SITE_OTHER): Payer: Medicaid Other | Admitting: Student

## 2022-02-26 VITALS — BP 150/80 | HR 85 | Temp 98.5°F | Ht 75.0 in | Wt 247.8 lb

## 2022-02-26 DIAGNOSIS — M25561 Pain in right knee: Secondary | ICD-10-CM

## 2022-02-26 DIAGNOSIS — G8929 Other chronic pain: Secondary | ICD-10-CM

## 2022-02-26 DIAGNOSIS — M170 Bilateral primary osteoarthritis of knee: Secondary | ICD-10-CM | POA: Diagnosis not present

## 2022-02-26 DIAGNOSIS — M545 Low back pain, unspecified: Secondary | ICD-10-CM

## 2022-02-26 MED ORDER — PANTOPRAZOLE SODIUM 40 MG PO TBEC: DELAYED_RELEASE_TABLET | ORAL | 2 refills | Status: DC

## 2022-02-26 NOTE — Assessment & Plan Note (Signed)
Patient presents with knee pain that has been worse more recently w/o hx of trauma, but does notes she's been falling more 2/2 knee insatiability. Pain is sharp and constant, patient TTP, unable to fully straighten leg, or completely bend knee past 90 degrees. Patient has been using naproxen at night, w/o relief of symptoms. Patient wanting knee injection, but not eligible until end of the month. Informed patient about risk of kidney/stomach damage w/ naproxen. Patient planing to get knee surgery in January, but is unsure about getting it 2/2 concern for scar. Patient not wanting to get blood work today to evaluate kidney function, thus told patient to avoid using naproxen for risk of harm to kidneys. Patient wanting to f/u w/ [facility] for possible injection other than steroid. Patient also reporting multiple falls due to knee instability, patient has walker but was using intermittently. Report's that walker is to short for her.   -Tyl for pain -Avoid Naproxen -BMP to future, if renal function ok, can use naproxen -F/u [facility] for injection options -New walker for taller people  

## 2022-02-26 NOTE — Telephone Encounter (Signed)
Received call from [facility], RN case manager with [facility] regarding patient. Patient is requesting bariatric/heavy duty tub bench and a different walker.   Patient has appointment this afternoon. This will need to be addressed in office visit per insurance guidelines. If DME orders are appropriate, please place and route back to RN team.   Thanks.   Veronda Prude, RN

## 2022-02-26 NOTE — Assessment & Plan Note (Addendum)
Patient reports continued back pain intermittently. Patient reports flexeril works for pain. Had written short rx of flexeril to get to today's appointment, but informed patient that we will use it sparingly as I don't want her to develop a tolerance to medication. Patient agreeable.  -f/u as needed -Flexeril sparingly

## 2022-02-26 NOTE — Progress Notes (Addendum)
SUBJECTIVE:   CHIEF COMPLAINT / HPI:   Knee pain and swelling Rt knee Patient called nurse line for pain meds for knee pain. Given course of Flexeril and Naproxen to make it to this visit.   Unsure why the knee is swollen/hurting, no hx of trauma/injury, but does appreciate that she has been falling because knee has been giving out on her. Does use a walker, but it's too small for her, height wise. She is trying to get a taller walker through [employer]. Knee pain is constant and sharp, and can't bend knee very far. Also appreciates that knee get's warm. Also requesting a tub seat for bathroom. Planning on getting a knee a replacement in January. Willing to go back to [facility] to see if they can offer a different type of shot.   Back Pain Patient reports flexeril works well for back pain, and request refill. Provider had previously given enough to make to this appointment.     PERTINENT  PMH / PSH:     OBJECTIVE:  BP (!) 150/80   Pulse 85   Temp 98.5 F (36.9 C)   Ht 6\' 3"  (1.905 m)   Wt 247 lb 12.8 oz (112.4 kg)   SpO2 98%   BMI 30.97 kg/m  Physical Exam Musculoskeletal:     Right knee: Swelling and effusion present. No deformity, erythema, ecchymosis or lacerations. Decreased range of motion. Tenderness present over the medial joint line and lateral joint line.     Comments: Patient able to bear weight on knee, but limping and unsteady when standing.       ASSESSMENT/PLAN:  Arthralgia of right lower leg Assessment & Plan: Patient presents with knee pain that has been worse more recently w/o hx of trauma, but does notes she's been falling more 2/2 knee insatiability. Pain is sharp and constant, patient TTP, unable to fully straighten leg, or completely bend knee past 90 degrees. Patient has been using naproxen at night, w/o relief of symptoms. Patient wanting knee injection, but not eligible until end of the month. Informed patient about risk of kidney/stomach damage w/  naproxen. Patient planing to get knee surgery in January, but is unsure about getting it 2/2 concern for scar. Patient not wanting to get blood work today to evaluate kidney function, thus told patient to avoid using naproxen for risk of harm to kidneys. Patient wanting to f/u w/ [facility] for possible injection other than steroid. Patient also reporting multiple falls due to knee instability, patient has walker but was using intermittently. Report's that walker is to short for her.   -Tyl for pain -Avoid Naproxen -BMP to future, if renal function ok, can use naproxen -F/u [facility] for injection options -New walker for taller people   Orders: -     BASIC METABOLIC PANEL WITH GFR; Future -     For home use only DME 4 wheeled rolling walker with seat  Osteoarthritis of both knees, unspecified osteoarthritis type -     BASIC METABOLIC PANEL WITH GFR; Future -     For home use only DME 4 wheeled rolling walker with seat  Chronic low back pain without sciatica, unspecified back pain laterality Assessment & Plan: Patient reports continued back pain intermittently. Patient reports flexeril works for pain. Had written short rx of flexeril to get to today's appointment, but informed patient that we will use it sparingly as I don't want her to develop a tolerance to medication. Patient agreeable.  -f/u as  needed -Flexeril sparingly   Health Maintenance Patient needing:  Mammogram (reports her anxiety is too bad, and she doesn't want bad news/needs sedation Colonoscopy DTAP  No follow-ups on file. [name], MD 02/26/2022, 5:42 PM PGY-2, [facility]

## 2022-02-26 NOTE — Assessment & Plan Note (Signed)
>>  ASSESSMENT AND PLAN FOR KNEE PAIN, RIGHT, CHRONIC WRITTEN ON 02/26/2022  5:38 PM BY [name], MD  Patient presents with knee pain that has been worse more recently w/o hx of trauma, but does notes she's been falling more 2/2 knee insatiability. Pain is sharp and constant, patient TTP, unable to fully straighten leg, or completely bend knee past 90 degrees. Patient has been using naproxen at night, w/o relief of symptoms. Patient wanting knee injection, but not eligible until end of the month. Informed patient about risk of kidney/stomach damage w/ naproxen. Patient planing to get knee surgery in January, but is unsure about getting it 2/2 concern for scar. Patient not wanting to get blood work today to evaluate kidney function, thus told patient to avoid using naproxen for risk of harm to kidneys. Patient wanting to f/u w/ [facility] for possible injection other than steroid. Patient also reporting multiple falls due to knee instability, patient has walker but was using intermittently. Report's that walker is to short for her.   -Tyl for pain -Avoid Naproxen -BMP to future, if renal function ok, can use naproxen -F/u [facility] for injection options -New walker for taller people

## 2022-02-26 NOTE — Patient Instructions (Addendum)
It was great to see you! Thank you for allowing me to participate in your care!  It looks like your knee is swollen and irritated, you may have injured it, or it  might be an arthritis flare.  Our plans for today:   Knee Pain - Steroid Inj available at end of month - Go see [facility]  [facility]  [address] [place], [place], [place], [address]  [phone] See if [facility] can offer an injection for your knee, that is not a steroid    PRPP or hyaluronic acid -Tylenol for pain -Naproxen  Can be damaging to Kidneys and stomach. Avoid using, until we look at your kidneys with lab draw  Back Pain -Flexeril Want to use sparingly, I don't want you to become tolerant to the medicine and it loose it's effectiveness. I also want you to only use at night, as it makes you sleepy.   Health Maintenance: You are due for a  Colonoscopy  Mammogram  Tdap vaccine  Check up   Take care and seek immediate care sooner if you develop any concerns.   Dr. [name], MD [facility]

## 2022-02-26 NOTE — Assessment & Plan Note (Deleted)
Patient presents with knee pain that has been worse more recently w/o hx of trauma, but does notes she's been falling more 2/2 knee insatiability. Pain is sharp and constant, patient TTP, unable to fully straighten leg, or completely bend knee past 90 degrees. Patient has been using naproxen at night, w/o relief of symptoms. Patient wanting knee injection, but not eligible until end of the month. Informed patient about risk of kidney/stomach damage w/ naproxen. Patient planing to get knee surgery in January, but is unsure about getting it 2/2 concern for scar. Patient not wanting to get blood work today to evaluate kidney function, thus told patient to avoid using naproxen for risk of harm to kidneys. Patient wanting to f/u w/ [facility] for possible injection other than steroid. Patient also reporting multiple falls due to knee instability, patient has walker but was using intermittently. Report's that walker is to short for her.   -Tyl for pain -Avoid Naproxen -BMP to future, if renal function ok, can use naproxen -F/u [facility] for injection options -New walker for taller people

## 2022-03-08 ENCOUNTER — Other Ambulatory Visit: Payer: Self-pay | Admitting: Student

## 2022-03-08 DIAGNOSIS — G8929 Other chronic pain: Secondary | ICD-10-CM

## 2022-04-04 ENCOUNTER — Telehealth: Payer: Self-pay | Admitting: Pharmacist

## 2022-04-04 DIAGNOSIS — F17200 Nicotine dependence, unspecified, uncomplicated: Secondary | ICD-10-CM

## 2022-04-04 NOTE — Telephone Encounter (Signed)
-----   Message from Leavy Cella, [place] sent at 02/25/2022  3:20 PM EST ----- Regarding: Tobacco Cessation Quit for 1 week due to illness 02/25/2022 8/10 confident she could stay quit for 2 more weeks.

## 2022-04-04 NOTE — Assessment & Plan Note (Signed)
Patient contacted for follow/up of tobacco intake reduction / cessation attempt.   Since last contact patient reports complete cessation for more than 3 days  Medications currently being used;  Varenicline -  Patient denies any significant side effects from tobacco cessation therapy.   Continues to rate CONFIDENCE of quitting tobacco as high.   Most common triggers to use tobacco include; Stress  Motivation to quit: Breathing - discussed success and need for her lungs to avoid toxins to minimize progression to significantly worse lung.

## 2022-04-04 NOTE — Telephone Encounter (Signed)
Patient contacted for follow/up of tobacco intake reduction / cessation attempt.   Since last contact patient reports complete cessation for more than 3 days  Medications currently being used;  Varenicline  Patient denies any significant side effects from tobacco cessation therapy.   Continues to rate CONFIDENCE of quitting tobacco as high.   Most common triggers to use tobacco include; Stress  Motivation to quit: Breathing - discussed success and need for her lungs to avoid toxins to minimize progression to significantly worse lung.    Patient is participating in a Managed Medicaid Plan:  Yes  Total time with patient call and documentation of interaction: 12 minutes. Follow-up phone call planned: 2 weeks

## 2022-04-04 NOTE — Telephone Encounter (Signed)
Noted and agree. 

## 2022-04-07 ENCOUNTER — Other Ambulatory Visit: Payer: Self-pay | Admitting: Student

## 2022-04-12 ENCOUNTER — Telehealth: Payer: Self-pay

## 2022-04-12 NOTE — Addendum Note (Signed)
Addended by: Dorna Bloom on: 04/12/2022 03:55 PM   Modules accepted: Orders

## 2022-04-12 NOTE — Telephone Encounter (Signed)
Patient calls nurse line regarding issues with picking up refills on pantoprazole. She states that she has called pharmacy and they state that they do  not have any refills on file. Per chart review, medication was sent over for 30 day supply on 02/26/22 with 2 refills.   Attempted to call pharmacy. They report that their system is currently down and to try to call back in 30 minutes. Will attempt to call pharmacy back later this afternoon.   Talbot Grumbling, RN

## 2022-04-12 NOTE — Telephone Encounter (Signed)
I called [facility] and spoke with pharmacist.   They report they received a prescription on 12/5, however report there are no refills.   [facility] system is currently down and they are unable to accept new prescriptions at this time.   I called the patient and she requests the prescription to be sent to [facility] on [address].   Will forward to PCP.

## 2022-04-14 MED ORDER — PANTOPRAZOLE SODIUM 40 MG PO TBEC: DELAYED_RELEASE_TABLET | ORAL | 2 refills | Status: DC

## 2022-04-14 NOTE — Addendum Note (Signed)
Addended by: Holley Bouche T on: 04/14/2022 12:45 AM   Modules accepted: Orders

## 2022-04-19 ENCOUNTER — Telehealth: Payer: Self-pay | Admitting: Pharmacist

## 2022-04-19 DIAGNOSIS — F17200 Nicotine dependence, unspecified, uncomplicated: Secondary | ICD-10-CM

## 2022-04-19 NOTE — Telephone Encounter (Signed)
Patient contacted for follow/up of tobacco intake reduction / cessation attempt.   Since last contact patient reports NOT smoking at all.   Medications currently being used;  Varenicline - STOPPED - no longer taking.   Continues to rates IMPORTANCE of quitting tobacco as high.   Most common triggers to use tobacco include; "none" - "I don't even like talking about it"   Motivation to quit: Breathing.  Reports dyspnea with climbing 1 flight of stairs or walking long distances.   Encouraged continued abstinence.  Patient thanked me for the follow-up.   Patient is participating in a Managed Medicaid Plan:  Yes  Total time with patient call and documentation of interaction: 12 minutes. Follow-up phone call planned: 2 months (3 month quit date) Patient does NOT have any PCP visit scheduled at this time.  Possibly consider Triple inhaler at that time.

## 2022-04-19 NOTE — Telephone Encounter (Signed)
-----  Message from Leavy Cella, [place] sent at 04/04/2022  3:38 PM EST ----- Regarding: Quit smoking - still quit Quit for 3 days as of 04/04/2022

## 2022-04-19 NOTE — Assessment & Plan Note (Signed)
Patient contacted for follow/up of tobacco intake reduction / cessation attempt.   Since last contact patient reports NOT smoking at all.   Medications currently being used;  Varenicline - STOPPED - no longer taking.   Continues to rates IMPORTANCE of quitting tobacco as high.   Most common triggers to use tobacco include; "none" - "I don't even like talking about it"   Motivation to quit: Breathing.  Reports dyspnea with climbing 1 flight of stairs or walking long distances.   Encouraged continued abstinence.  Patient thanked me for the follow-up.   Patient is participating in a Managed Medicaid Plan:  Yes  Total time with patient call and documentation of interaction: 12 minutes. Follow-up phone call planned: 2 months (3 month quit date) Patient does NOT have any PCP visit scheduled at this time.  Possibly consider Triple inhaler at that time.  

## 2022-04-19 NOTE — Telephone Encounter (Signed)
Noted and agree. 

## 2022-04-26 ENCOUNTER — Ambulatory Visit: Payer: Self-pay

## 2022-04-26 ENCOUNTER — Ambulatory Visit: Payer: Medicaid Other | Admitting: Family Medicine

## 2022-04-26 VITALS — BP 152/80 | Ht 75.0 in | Wt 250.0 lb

## 2022-04-26 DIAGNOSIS — M25561 Pain in right knee: Secondary | ICD-10-CM | POA: Diagnosis present

## 2022-04-26 DIAGNOSIS — M79642 Pain in left hand: Secondary | ICD-10-CM

## 2022-04-26 MED ORDER — METHYLPREDNISOLONE ACETATE 40 MG/ML IJ SUSP
40.0000 mg | Freq: Once | INTRAMUSCULAR | Status: AC
  Administered 2022-04-26: 40 mg via INTRA_ARTICULAR
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
## 2022-04-26 NOTE — Progress Notes (Signed)
[facility]: Attending Note: I have examined the patient, reviewed the chart, discussed the assessment and plan with the Sports Medicine Fellow. I agree with assessment and treatment plan as detailed in the Fellow's note.  
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
## 2022-05-10 ENCOUNTER — Ambulatory Visit: Payer: Medicaid Other | Admitting: Family Medicine

## 2022-05-13 ENCOUNTER — Ambulatory Visit: Payer: Medicaid Other | Admitting: Family Medicine

## 2022-07-01 ENCOUNTER — Telehealth: Payer: Self-pay | Admitting: Pharmacist

## 2022-07-01 DIAGNOSIS — F17200 Nicotine dependence, unspecified, uncomplicated: Secondary | ICD-10-CM

## 2022-07-01 NOTE — Telephone Encounter (Signed)
-----   Message from Kathrin Ruddy, RPH-CPP sent at 04/19/2022 10:37 AM EST ----- Regarding: 3 month quit - Quit in early January 2024 3 month quit?

## 2022-07-01 NOTE — Telephone Encounter (Signed)
Patient contacted for follow/up of tobacco intake reduction / cessation attempt.   Since last contact patient reports smoking only 1 cigarette per week.   Medications currently being used; None  Continues to rates IMPORTANCE of quitting tobacco as high.   Most common triggers to use tobacco include; habit - discussed need to identify complete date for cessation and for her to make progress.   Motivation to quit: BREATHING   Total time with patient call and documentation of interaction: 11 minutes. Follow-up phone call planned: 1-2 months

## 2022-07-01 NOTE — Assessment & Plan Note (Signed)
Patient contacted for follow/up of tobacco intake reduction / cessation attempt.   Since last contact patient reports smoking only 1 cigarette per week.   Medications currently being used; None  Continues to rates IMPORTANCE of quitting tobacco as high.   Most common triggers to use tobacco include; habit - discussed need to identify complete date for cessation and for her to make progress.   Motivation to quit: BREATHING

## 2022-07-02 ENCOUNTER — Other Ambulatory Visit: Payer: Self-pay | Admitting: Student

## 2022-07-08 ENCOUNTER — Telehealth: Payer: Self-pay

## 2022-07-08 MED ORDER — PANTOPRAZOLE SODIUM 40 MG PO TBEC: DELAYED_RELEASE_TABLET | ORAL | 2 refills | Status: DC

## 2022-07-08 NOTE — Telephone Encounter (Signed)
Patient calls nurse line in regards to Protonix prescription.   She reports this prescription needs to go to [facility] on [address].   I called [facility] and cancelled prescription.   Prescription resent to [facility].

## 2022-08-07 ENCOUNTER — Other Ambulatory Visit: Payer: Self-pay | Admitting: Student

## 2022-08-07 ENCOUNTER — Encounter: Payer: Self-pay | Admitting: Student

## 2022-08-07 ENCOUNTER — Ambulatory Visit (INDEPENDENT_AMBULATORY_CARE_PROVIDER_SITE_OTHER): Payer: Medicaid Other | Admitting: Student

## 2022-08-07 ENCOUNTER — Other Ambulatory Visit: Payer: Self-pay

## 2022-08-07 VITALS — BP 161/100 | HR 65 | Wt 254.6 lb

## 2022-08-07 DIAGNOSIS — M25562 Pain in left knee: Secondary | ICD-10-CM | POA: Diagnosis not present

## 2022-08-07 DIAGNOSIS — M25561 Pain in right knee: Secondary | ICD-10-CM

## 2022-08-07 DIAGNOSIS — G8929 Other chronic pain: Secondary | ICD-10-CM | POA: Diagnosis not present

## 2022-08-07 DIAGNOSIS — I10 Essential (primary) hypertension: Secondary | ICD-10-CM | POA: Diagnosis not present

## 2022-08-07 MED ORDER — HYDROCHLOROTHIAZIDE 12.5 MG PO TABS: 12.5000 mg | ORAL_TABLET | Freq: Every day | ORAL | 1 refills | Status: DC

## 2022-08-07 MED ORDER — METHYLPREDNISOLONE ACETATE 40 MG/ML IJ SUSP
40.0000 mg | Freq: Once | INTRAMUSCULAR | Status: AC
  Administered 2022-08-07: 40 mg via INTRA_ARTICULAR

## 2022-08-07 NOTE — Patient Instructions (Addendum)
It was great to see you! Thank you for allowing me to participate in your care!  Today we saw you for bilateral knee injections. You can receive these every 3 months.   Our plans for today:   - Knee Pain Bilateral knee injections. You can get these once every 3 months Watch out for drainage of fluid, redness, swelling in knee/area where injections were given. Make appointment to be seen if any of these symptoms are noted   Get Knee X-Ray of left   [facility]   [address] [place], [place], [place] [address]   Phone: [phone]      - High Blood Pressure  Start HCTZ 12.5 mg and take daily   Make follow up appointment in 1 week  Bring log of blood pressures    Take care and seek immediate care sooner if you develop any concerns.   Dr. [name], MD [facility]

## 2022-08-07 NOTE — Progress Notes (Addendum)
SUBJECTIVE:   CHIEF COMPLAINT / HPI:   Knee Pain -Patient comes in for OA of knees wanting injection -Last seen 02/26/22 and had plans for surgery in January? -Seen by Sports Med 04/26/22 for knee steroid injection -Told to avoid naproxen for renal function (no new labs to check), using Tylenol  Knees have both been hurting since last seen in February. Wanting shots done today, is planning to go for beach trip the following week. Never got the walker for a taller person, but was supposed to have been ordered last time. Report's using her walker at home, but having to bend over for it. Knee swelling has been present since February and she is not wanting fluid drawn off.   Blood Pressure Patient notes blood pressure is high because of knee pain, and she checks it at home. It is often above 140/90, but does dip down below that at times as well.   PERTINENT  PMH / PSH: OA   Patient Care Team: [name], MD as PCP - General (Family Medicine) OBJECTIVE:  BP (!) 161/100   Pulse 65   Wt 254 lb 9.6 oz (115.5 kg)   SpO2 98%   BMI 31.82 kg/m  Physical Exam Musculoskeletal:     Right knee: Effusion and bony tenderness present. No swelling, deformity, erythema, ecchymosis, lacerations or crepitus. Normal range of motion. Tenderness present over the medial joint line and lateral joint line. Normal alignment.     Instability Tests: Anterior drawer test negative. Posterior drawer test negative.     Left knee: Bony tenderness present. No swelling, deformity, effusion, erythema, ecchymosis, lacerations or crepitus. Normal range of motion. Tenderness present over the medial joint line and lateral joint line. Normal alignment.     Instability Tests: Anterior drawer test negative. Posterior drawer test negative.      ASSESSMENT/PLAN:  Arthralgia of right lower leg Assessment & Plan: PROCEDURE: INJECTION: Patient was given informed consent, signed copy in the chart. Appropriate time out was  taken. Area prepped and draped in usual sterile fashion. A 21 gauge 1 1/2 inch needle was used. 1 cc of methylprednisolone 40 mg/ml plus  4 cc of 1% lidocaine without epinephrine was injected into the knee using a(n) anteromedial approach.   The patient tolerated the procedure well. There were no complications. Post procedure instructions were given.   Chronic pain of left knee Assessment & Plan: Patient notes chronic left knee pain, despite no imaging, and is requesting knee injection today. Will inject and obtain knee imaging. -DG Left Knee  PROCEDURE: INJECTION: Patient was given informed consent, signed copy in the chart. Appropriate time out was taken. Area prepped and draped in usual sterile fashion. A 21 gauge 1 1/2 inch needle was used.. 1 cc of methylprednisolone 40 mg/ml plus  4 cc of 1% lidocaine without epinephrine was injected into the knee using a(n) anteromedial approach.   The patient tolerated the procedure well. There were no complications. Post procedure instructions were given.  Orders: -     DG Knee Complete 4 Views Left; Future -     methylPREDNISolone Acetate  Hypertension, unspecified type Assessment & Plan: Patient noted to be hypertensive in clinic today, and for last 5 visits, going back to 9/23. Patient notes BP elevated 2/2 pain related to her OA. Will start patient on antihypertensive and have her f/u. Patient to take BP daily and bring log to next visit. -HCTZ 12.5 mg daily -F/u 1 wk for BP check -Patient to bring BP  log    No follow-ups on file. [name], MD 08/20/2022, 7:38 AM PGY-2, [facility]

## 2022-08-08 ENCOUNTER — Telehealth: Payer: Self-pay

## 2022-08-08 DIAGNOSIS — I10 Essential (primary) hypertension: Secondary | ICD-10-CM | POA: Insufficient documentation

## 2022-08-08 NOTE — Assessment & Plan Note (Addendum)
>>  ASSESSMENT AND PLAN FOR CHRONIC PAIN OF BOTH KNEES WRITTEN ON 12/09/2022 12:24 PM BY Dura Mccormack, DO  >>ASSESSMENT AND PLAN FOR LEFT KNEE PAIN WRITTEN ON 08/20/2022  7:37 AM BY [name], MD  Patient notes chronic left knee pain, despite no imaging, and is requesting knee injection today. Will inject and obtain knee imaging. -DG Left Knee  PROCEDURE: INJECTION: Patient was given informed consent, signed copy in the chart. Appropriate time out was taken. Area prepped and draped in usual sterile fashion. A 21 gauge 1 1/2 inch needle was used.. 1 cc of methylprednisolone 40 mg/ml plus  4 cc of 1% lidocaine without epinephrine was injected into the knee using a(n) anteromedial approach.   The patient tolerated the procedure well. There were no complications. Post procedure instructions were given.  >>ASSESSMENT AND PLAN FOR KNEE PAIN, RIGHT, CHRONIC WRITTEN ON 08/20/2022  7:38 AM BY [name], MD  PROCEDURE: INJECTION: Patient was given informed consent, signed copy in the chart. Appropriate time out was taken. Area prepped and draped in usual sterile fashion. A 21 gauge 1 1/2 inch needle was used. 1 cc of methylprednisolone 40 mg/ml plus  4 cc of 1% lidocaine without epinephrine was injected into the knee using a(n) anteromedial approach.   The patient tolerated the procedure well. There were no complications. Post procedure instructions were given.

## 2022-08-08 NOTE — Assessment & Plan Note (Addendum)
Patient notes chronic left knee pain, despite no imaging, and is requesting knee injection today. Will inject and obtain knee imaging. -DG Left Knee  PROCEDURE: INJECTION: Patient was given informed consent, signed copy in the chart. Appropriate time out was taken. Area prepped and draped in usual sterile fashion. A 21 gauge 1 1/2 inch needle was used.. 1 cc of methylprednisolone 40 mg/ml plus  4 cc of 1% lidocaine without epinephrine was injected into the knee using a(n) anteromedial approach.   The patient tolerated the procedure well. There were no complications. Post procedure instructions were given.

## 2022-08-08 NOTE — Telephone Encounter (Signed)
Patient calls nurse line requesting a letter of support for her rental.   She reports she has a ramp outside of her home, however they have plans to tear it down on Saturday.   She reports she needs a letter stating she is disabled and requires a ramp to get into her home easily.   Will forward to PCP.

## 2022-08-08 NOTE — Assessment & Plan Note (Addendum)
PROCEDURE: INJECTION: Patient was given informed consent, signed copy in the chart. Appropriate time out was taken. Area prepped and draped in usual sterile fashion. A 21 gauge 1 1/2 inch needle was used. 1 cc of methylprednisolone 40 mg/ml plus  4 cc of 1% lidocaine without epinephrine was injected into the knee using a(n) anteromedial approach.   The patient tolerated the procedure well. There were no complications. Post procedure instructions were given.

## 2022-08-08 NOTE — Assessment & Plan Note (Signed)
Patient noted to be hypertensive in clinic today, and for last 5 visits, going back to 9/23. Patient notes BP elevated 2/2 pain related to her OA. Will start patient on antihypertensive and have her f/u. Patient to take BP daily and bring log to next visit. -HCTZ 12.5 mg daily -F/u 1 wk for BP check -Patient to bring BP log

## 2022-08-22 ENCOUNTER — Telehealth: Payer: Self-pay | Admitting: Student

## 2022-08-22 ENCOUNTER — Telehealth: Payer: Self-pay | Admitting: Pharmacist

## 2022-08-22 DIAGNOSIS — F17200 Nicotine dependence, unspecified, uncomplicated: Secondary | ICD-10-CM

## 2022-08-22 NOTE — Assessment & Plan Note (Signed)
Patient contacted for follow-up of tobacco intake reduction / cessation attempt.   Since last contact patient reports smoking ~ 2 cigs per day  Medications currently being used;  NONE.  Requested refills on her inhalers.  Refills provided.   Patient denies any significant side effects from tobacco cessation therapy.   Continues to rates IMPORTANCE of quitting tobacco as high.  Continues to rate CONFIDENCE of quitting tobacco as high.   Most common triggers to use tobacco include; stress and boredom   Motivation to quit: health/breathing Desired to quit prior to grandson's birthday in early September.  Congratulated on progress with tobacco cessation attempts. Encouraged continued progress toward complete cessation.

## 2022-08-22 NOTE — Telephone Encounter (Signed)
Called to inquire about patient needing a letter for ramp at apartment. Provider cannot make the claim that she is disabled and that would have to be taken up with a disability lawyer.   Provider can provide letter saying patient has physical impairments and that a ramp would help facilitate her accessing apartment. This does not force the apartment to do anything about the ramp.   Please let provider know if she would like this letter.

## 2022-08-22 NOTE — Telephone Encounter (Signed)
Attempted to call patient, no answer and VM full.

## 2022-08-22 NOTE — Telephone Encounter (Signed)
-----   Message from Kathrin Ruddy, RPH-CPP sent at 07/01/2022  2:29 PM EDT ----- Regarding: Tobacco complete cessation

## 2022-08-22 NOTE — Telephone Encounter (Signed)
Patient contacted for follow-up of tobacco intake reduction / cessation attempt.   Since last contact patient reports smoking ~ 2 cigs per day  Medications currently being used;  NONE.  Requested refills on her inhalers.  Refills provided.   Patient denies any significant side effects from tobacco cessation therapy.   Continues to rates IMPORTANCE of quitting tobacco as high.  Continues to rate CONFIDENCE of quitting tobacco as high.   Most common triggers to use tobacco include; stress and boredom   Motivation to quit: health/breathing Desired to quit prior to grandson's birthday in early September.  Congratulated on progress with tobacco cessation attempts. Encouraged continued progress toward complete cessation.   Total time with patient call and documentation of interaction: 14 minutes.  Patient plans to follow-up in near future with PCP for BP monitoring and follow-up.  Follow-up phone call planned: 2 months

## 2022-08-23 NOTE — Telephone Encounter (Signed)
Reviewed and agree with Dr Koval's plan.   

## 2022-08-26 ENCOUNTER — Other Ambulatory Visit: Payer: Self-pay | Admitting: Student

## 2022-10-31 ENCOUNTER — Other Ambulatory Visit: Payer: Self-pay | Admitting: Student

## 2022-11-01 ENCOUNTER — Telehealth: Payer: Self-pay | Admitting: Pharmacist

## 2022-11-01 DIAGNOSIS — F17200 Nicotine dependence, unspecified, uncomplicated: Secondary | ICD-10-CM

## 2022-11-01 DIAGNOSIS — R062 Wheezing: Secondary | ICD-10-CM

## 2022-11-01 DIAGNOSIS — J4489 Other specified chronic obstructive pulmonary disease: Secondary | ICD-10-CM

## 2022-11-01 DIAGNOSIS — Z720 Tobacco use: Secondary | ICD-10-CM

## 2022-11-01 MED ORDER — ALBUTEROL SULFATE HFA 108 (90 BASE) MCG/ACT IN AERS: 2.0000 | INHALATION_SPRAY | RESPIRATORY_TRACT | 1 refills | Status: DC | PRN

## 2022-11-01 MED ORDER — DULERA 200-5 MCG/ACT IN AERO: 2.0000 | INHALATION_SPRAY | Freq: Two times a day (BID) | RESPIRATORY_TRACT | 6 refills | Status: DC

## 2022-11-01 NOTE — Telephone Encounter (Signed)
-----   Message from Madelon Lips sent at 08/22/2022  1:44 PM EDT ----- Regarding: Quit smoking? Smoking 2/day end of May 2024

## 2022-11-01 NOTE — Assessment & Plan Note (Signed)
Asthma/COPD overlap with severe obstruction - 2020 - lung age of 67. Refilled inhalers at her request.  Breathing stable.

## 2022-11-01 NOTE — Telephone Encounter (Signed)
Reviewed and agree with Dr Koval's plan.   

## 2022-11-01 NOTE — Telephone Encounter (Signed)
Patient contacted for follow/up of tobacco intake reduction maintenance and potential future cessation attempt.   Since last contact patient reports she continues to be under a great deal of stress.  She reports that she sleeps all day after she takes her Serqoquel (quetiapine) 400mg  +200mg  during the day. Then sleeps all day.  She reports her psychiatrist is working to help her reduce stress and sleeping all day helps.   Reports smoking 1 "Black and Mild" per WEEK.  She smokes only 2-3 pulls per use and use each morning then also when she awakens from her daylong sleep due to seroquel.   Continues to rates IMPORTANCE of quitting tobacco as high.  Continues to rate CONFIDENCE of quitting tobacco as possible by the end of the year.   Most common triggers to use tobacco include; stress,    I congratulated her on her continued minimal use of tobacco products during the stressful times she is experiencing.   Made appointment with PCP, [place] for visit in 3 weeks.   Asthma/COPD overlap with severe obstruction - 2020 - lung age of 68. Refilled inhalers at her request.  Breathing stable.    Total time with patient call and documentation of interaction: 19 minutes. Follow-up phone call planned: 2-3 months (goal is to be quit at that time)

## 2022-11-01 NOTE — Assessment & Plan Note (Signed)
Patient contacted for follow/up of tobacco intake reduction maintenance and potential future cessation attempt.   Since last contact patient reports she continues to be under a great deal of stress.  She reports that she sleeps all day after she takes her Serqoquel (quetiapine) 400mg  +200mg  during the day. Then sleeps all day.  She reports her psychiatrist is working to help her reduce stress and sleeping all day helps.   Reports smoking 1 "Black and Mild" per WEEK.  She smokes only 2-3 pulls per use and use each morning then also when she awakens from her daylong sleep due to seroquel.   Continues to rates IMPORTANCE of quitting tobacco as high.  Continues to rate CONFIDENCE of quitting tobacco as possible by the end of the year.   Most common triggers to use tobacco include; stress,    I congratulated her on her continued minimal use of tobacco products during the stressful times she is experiencing.

## 2022-11-04 ENCOUNTER — Telehealth: Payer: Self-pay

## 2022-11-04 ENCOUNTER — Other Ambulatory Visit: Payer: Self-pay | Admitting: Student

## 2022-11-04 MED ORDER — PANTOPRAZOLE SODIUM 40 MG PO TBEC: 40.0000 mg | DELAYED_RELEASE_TABLET | Freq: Two times a day (BID) | ORAL | 0 refills | Status: DC

## 2022-11-04 NOTE — Progress Notes (Signed)
Increased protonix to twice daily to help control symptoms until pt is able to come in for apt.

## 2022-11-04 NOTE — Telephone Encounter (Signed)
Patient has been informed of her acid reflux medication being increased, she said that tums does not work for her. I informed her that if the new increase does not work to schedule a sooner appointment. Penni Bombard CMA

## 2022-11-04 NOTE — Telephone Encounter (Signed)
Patient calls nurse line regarding protonix prescription.   She states that she needs provider to send a new prescription with updated directions for her insurance to cover medication.   Patient states that she is having to take 4 tablets per day for her acid reflux symptoms.   She has follow up with PCP scheduled on 11/27/22.   Please advise.   Veronda Prude, RN

## 2022-11-13 ENCOUNTER — Other Ambulatory Visit: Payer: Self-pay | Admitting: Student

## 2022-11-26 ENCOUNTER — Encounter: Payer: Self-pay | Admitting: Pharmacist

## 2022-11-27 ENCOUNTER — Ambulatory Visit: Payer: Medicaid Other | Admitting: Student

## 2022-11-27 NOTE — Progress Notes (Deleted)
  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  Last Assessment & Plan:  Relevant Hx: Course: Daily Update: Today's Plan:    Congestion of nasal sinus 08/03/2018   Depression    ECZEMA, ATOPIC DERMATITIS 08/25/2007   Qualifier: Diagnosis of  By: Seleta Rhymes MD, Mark     GERD (gastroesophageal reflux disease)    Hypopigmented skin lesion 06/22/2019   Impingement syndrome of right shoulder 04/16/2019   IRREGULAR MENSES 11/03/2007   Qualifier: Diagnosis of  By: Jennette Kettle MD, Huntley Dec     LOW BACK PAIN, CHRONIC 06/30/2006   Qualifier: Diagnosis of  By: Seleta Rhymes MD, Mark     Osteoarthritis of knee 03/05/2012   OVARIAN CYST 08/12/2007   Qualifier: Diagnosis of  By: Seleta Rhymes MD, Mark     Panic attacks    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 Physical Exam   ASSESSMENT/PLAN:  There are no diagnoses linked to this encounter. No follow-ups on file. [name], MD 11/27/2022, 8:01 AM PGY-***, [facility] {    This will disappear when note is signed, click to select method of visit    :1}

## 2022-11-29 ENCOUNTER — Ambulatory Visit: Payer: Medicaid Other | Admitting: Student

## 2022-11-29 NOTE — Progress Notes (Deleted)
  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  Last Assessment & Plan:  Relevant Hx: Course: Daily Update: Today's Plan:    Congestion of nasal sinus 08/03/2018   Depression    ECZEMA, ATOPIC DERMATITIS 08/25/2007   Qualifier: Diagnosis of  By: Seleta Rhymes MD, Mark     GERD (gastroesophageal reflux disease)    Hypopigmented skin lesion 06/22/2019   Impingement syndrome of right shoulder 04/16/2019   IRREGULAR MENSES 11/03/2007   Qualifier: Diagnosis of  By: Jennette Kettle MD, Huntley Dec     LOW BACK PAIN, CHRONIC 06/30/2006   Qualifier: Diagnosis of  By: Seleta Rhymes MD, Mark     Osteoarthritis of knee 03/05/2012   OVARIAN CYST 08/12/2007   Qualifier: Diagnosis of  By: Seleta Rhymes MD, Mark     Panic attacks    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 Physical Exam   ASSESSMENT/PLAN:  There are no diagnoses linked to this encounter. No follow-ups on file. [name], MD 11/29/2022, 7:52 AM PGY-***, [facility] {    This will disappear when note is signed, click to select method of visit    :1}

## 2022-12-02 ENCOUNTER — Ambulatory Visit (INDEPENDENT_AMBULATORY_CARE_PROVIDER_SITE_OTHER): Payer: Medicaid Other | Admitting: Student

## 2022-12-02 VITALS — BP 148/83 | HR 76 | Ht 75.0 in | Wt 256.4 lb

## 2022-12-02 DIAGNOSIS — M545 Low back pain, unspecified: Secondary | ICD-10-CM

## 2022-12-02 DIAGNOSIS — I10 Essential (primary) hypertension: Secondary | ICD-10-CM

## 2022-12-02 DIAGNOSIS — E785 Hyperlipidemia, unspecified: Secondary | ICD-10-CM

## 2022-12-02 DIAGNOSIS — G8929 Other chronic pain: Secondary | ICD-10-CM

## 2022-12-02 DIAGNOSIS — M25561 Pain in right knee: Secondary | ICD-10-CM

## 2022-12-02 DIAGNOSIS — R030 Elevated blood-pressure reading, without diagnosis of hypertension: Secondary | ICD-10-CM | POA: Diagnosis not present

## 2022-12-02 DIAGNOSIS — R7303 Prediabetes: Secondary | ICD-10-CM | POA: Diagnosis not present

## 2022-12-02 DIAGNOSIS — M25562 Pain in left knee: Secondary | ICD-10-CM | POA: Diagnosis not present

## 2022-12-02 MED ORDER — HYDROCHLOROTHIAZIDE 25 MG PO TABS: 25.0000 mg | ORAL_TABLET | Freq: Every day | ORAL | 0 refills | Status: DC

## 2022-12-02 MED ORDER — CYCLOBENZAPRINE HCL 10 MG PO TABS: ORAL_TABLET | ORAL | 0 refills | Status: DC

## 2022-12-02 NOTE — Patient Instructions (Signed)
I increased your blood pressure medication to 25 mg hydrochlorothiazide, you can take two tablets of the 12.5 mg dosage. Please keep a log of your pressures at home.   Blood Pressure Record Sheet To take your blood pressure, you will need a blood pressure machine. You can buy a blood pressure machine (blood pressure monitor) at your clinic, drug store, or online. When choosing one, consider: An automatic monitor that has an arm cuff. A cuff that wraps snugly around your upper arm. You should be able to fit only one finger between your arm and the cuff. A device that stores blood pressure reading results. Do not choose a monitor that measures your blood pressure from your wrist or finger. Follow your health care provider's instructions for how to take your blood pressure. To use this form: Take your blood pressure medications every day These measurements should be taken when you have been at rest for at least 10-15 min Take at least 2 readings with each blood pressure check. This makes sure the results are correct. Wait 1-2 minutes between measurements. Write down the results in the spaces on this form. Keep in mind it should always be recorded systolic over diastolic. Both numbers are important.  Repeat this every day for 2-3 weeks, or as told by your health care provider.  Make a follow-up appointment with your health care provider to discuss the results.  Blood Pressure Log Date Medications taken? (Y/N) Blood Pressure Time of Day                                                                                                         I've scheduled you for bilateral knee injections as below.   Future Appointments  Date Time Provider Department Center  12/09/2022 10:50 AM Shelby Mattocks, DO [facility]    Please arrive 15 minutes before your appointment to ensure smooth check in process.    Please call the clinic at [phone] if your symptoms worsen or you have  any concerns.  Thank you for allowing me to participate in your care, Dr. Glendale Chard [facility]

## 2022-12-02 NOTE — Assessment & Plan Note (Signed)
Increased HCTZ to 25 mg.  Ordered BMP.  At next visit please recheck blood pressure.  Provided BP log for patient to fill out.

## 2022-12-02 NOTE — Assessment & Plan Note (Signed)
Recheck A1c today 

## 2022-12-02 NOTE — Assessment & Plan Note (Signed)
Recheck lipid panel 

## 2022-12-02 NOTE — Assessment & Plan Note (Signed)
>>  ASSESSMENT AND PLAN FOR KNEE PAIN, RIGHT, CHRONIC WRITTEN ON 12/02/2022 11:54 AM BY MILLER, EMILY, DO  Severe osteoarthritis documented.  Unfortunately due to time constraints was unable to complete steroid injections today.  Rescheduled with different provider next week.  Unfortunately due to patient transportation issues she can only, Monday mornings.  Sent message to referring provider about scheduling.

## 2022-12-02 NOTE — Progress Notes (Signed)
    SUBJECTIVE:   CHIEF COMPLAINT / HPI:   [name] is a 57 y.o. female  presenting for blood pressure follow-up.  Currently she is only on HCTZ 12.5 mg.  She reports that this blood pressure medication makes her feel tired.  She reports checking her pressures at home with normally her systolics being in the 130s.  She reports good compliance to her medication.  Patient would also like to have bilateral knee injections done today.  She has previously had them done at sports medicine with good symptomatic relief.  If we are unable to do injections today she was requesting muscle relaxers.  HLD: Reports compliance to Crestor 10 mg daily  Prediabetic: Does not check blood sugars at home, currently asymptomatic.  PERTINENT  PMH / PSH: Reviewed and updated   OBJECTIVE:   BP (!) 148/83   Pulse 76   Ht 6\' 3"  (1.905 m)   Wt 256 lb 6.4 oz (116.3 kg)   SpO2 99%   BMI 32.05 kg/m   Will-appearing, no acute distress Cardio: Regular rate, regular rhythm, no murmurs on exam. Pulm: Clear, mild diffuse expiratory wheezing, no crackles. No increased work of breathing Extremities: no peripheral edema  Psych:  Cognition and judgment appear intact. Alert, communicative  and cooperative with normal attention span and concentration. No apparent delusions, illusions, hallucinations      12/02/2022    9:35 AM 08/07/2022   11:50 AM 02/26/2022    4:33 PM  PHQ9 SCORE ONLY  PHQ-9 Total Score 13 10 16       ASSESSMENT/PLAN:   Elevated blood pressure reading Increased HCTZ to 25 mg.  Ordered BMP.  At next visit please recheck blood pressure.  Provided BP log for patient to fill out.  Hyperlipidemia Recheck lipid panel   Prediabetes Recheck A1c today  KNEE PAIN, RIGHT, CHRONIC Severe osteoarthritis documented.  Unfortunately due to time constraints was unable to complete steroid injections today.  Rescheduled with different provider next week.  Unfortunately due to patient transportation  issues she can only, Monday mornings.  Sent message to referring provider about scheduling.     Glendale Chard, DO [facility]

## 2022-12-02 NOTE — Assessment & Plan Note (Signed)
>>  ASSESSMENT AND PLAN FOR ELEVATED BLOOD PRESSURE READING WRITTEN ON 12/02/2022 11:52 AM BY MILLER, EMILY, DO  Increased HCTZ to 25 mg.  Ordered BMP.  At next visit please recheck blood pressure.  Provided BP log for patient to fill out.

## 2022-12-02 NOTE — Assessment & Plan Note (Signed)
Severe osteoarthritis documented.  Unfortunately due to time constraints was unable to complete steroid injections today.  Rescheduled with different provider next week.  Unfortunately due to patient transportation issues she can only, Monday mornings.  Sent message to referring provider about scheduling.

## 2022-12-03 LAB — BASIC METABOLIC PANEL
BUN/Creatinine Ratio: 10 (ref 9–23)
BUN: 12 mg/dL (ref 6–24)
CO2: 22 mmol/L (ref 20–29)
Calcium: 9.7 mg/dL (ref 8.7–10.2)
Chloride: 102 mmol/L (ref 96–106)
Creatinine, Ser: 1.24 mg/dL — ABNORMAL HIGH (ref 0.57–1.00)
Glucose: 83 mg/dL (ref 70–99)
Potassium: 4.2 mmol/L (ref 3.5–5.2)
Sodium: 140 mmol/L (ref 134–144)
eGFR: 51 mL/min/{1.73_m2} — ABNORMAL LOW (ref >59)

## 2022-12-03 LAB — LIPID PANEL
Chol/HDL Ratio: 4.3 ratio (ref 0.0–4.4)
Cholesterol, Total: 295 mg/dL — ABNORMAL HIGH (ref 100–199)
HDL: 69 mg/dL (ref >39)
LDL Chol Calc (NIH): 190 mg/dL — ABNORMAL HIGH (ref 0–99)
Triglycerides: 194 mg/dL — ABNORMAL HIGH (ref 0–149)
VLDL Cholesterol Cal: 36 mg/dL (ref 5–40)

## 2022-12-03 LAB — HEMOGLOBIN A1C
Est. average glucose Bld gHb Est-mCnc: 128 mg/dL
Hgb A1c MFr Bld: 6.1 % — ABNORMAL HIGH (ref 4.8–5.6)

## 2022-12-07 NOTE — Progress Notes (Unsigned)
  SUBJECTIVE:   CHIEF COMPLAINT / HPI:   Patient presents today requesting steroid injection in both knees.  Of note, last right knee XR showed moderate to advanced tricompartmental degenerative osteoarthrosis.  She does not have x-ray findings since 2009 for left knee.  Patient endorses that she does have frequent falls related to the right knee giving out.  The steroid injections do help with this.  She does not have a regular pain regimen for her knee pain.  She does follow-up with an orthopedic surgeon.  PERTINENT  PMH / PSH: HTN, HLD, prediabetes, bilateral knee OA, bipolar disorder  OBJECTIVE:  BP 138/83   Pulse 100   Ht 6\' 3"  (1.905 m)   Wt 248 lb 6.4 oz (112.7 kg)   SpO2 100%   BMI 31.05 kg/m  General: Well-appearing, NAD MSK, knees: No appreciable effusions or erythema  ASSESSMENT/PLAN:   Assessment & Plan Chronic pain of both knees Severe OA and right knee which is also notable on prior x-rays.  She is to get left knee x-ray today, although I suspect this will also show chronic changes.  Counseled on conservative pain regimen of Tylenol 1000 mg every 6 hours as needed and ibuprofen 600 mg every 8 hours as needed and Voltaren gel.  Both knees injected today, see procedure below.  I would recommend against further injections given she is having falls from right knee weakness and while injections may be helping this, she may need right knee replacement regardless.  She does follow with orthopedic surgery and I have advised her to return to see them.  Written and verbal consent was obtained after discussing the risks and benefits of the procedure with the patient. A timeout was performed and the correct site and side was identified and confirmed.  The left and right knee was cleaned in sterile fashion betadine and alcohol pad. 1cc 40 mg Depo-medrol and 4 cc 2% Lidocaine was injected using a anterolateral approach using a 5 cc syringe and 25 gauge 1 and 1/2 in needle. No complications  were encountered. Minimal blood loss. A band aid was applied.   Hyperlipidemia, unspecified hyperlipidemia type Reviewed lipid panel, significantly elevated LDL.  She has now been taking her medications so I have advised she take her rosuvastatin and sent this to pharmacy for her. Hypertension, unspecified type BP: 138/83 today. Well controlled, consider further control for goal of <130/80. Medication regimen: HCTZ 25 mg daily   Return in about 3 weeks (around 12/30/2022) for Hyperlipidemia and healthcare maintenance. Shelby Mattocks, DO 12/09/2022, 12:29 PM PGY-3, [facility]

## 2022-12-09 ENCOUNTER — Ambulatory Visit: Payer: Medicaid Other | Admitting: Student

## 2022-12-09 VITALS — BP 138/83 | HR 100 | Ht 75.0 in | Wt 248.4 lb

## 2022-12-09 DIAGNOSIS — M25562 Pain in left knee: Secondary | ICD-10-CM

## 2022-12-09 DIAGNOSIS — M25561 Pain in right knee: Secondary | ICD-10-CM | POA: Diagnosis present

## 2022-12-09 DIAGNOSIS — G8929 Other chronic pain: Secondary | ICD-10-CM

## 2022-12-09 DIAGNOSIS — I10 Essential (primary) hypertension: Secondary | ICD-10-CM | POA: Diagnosis not present

## 2022-12-09 DIAGNOSIS — E785 Hyperlipidemia, unspecified: Secondary | ICD-10-CM | POA: Diagnosis not present

## 2022-12-09 MED ORDER — METHYLPREDNISOLONE ACETATE 40 MG/ML IJ SUSP
40.0000 mg | Freq: Once | INTRAMUSCULAR | Status: AC
  Administered 2022-12-09: 40 mg via INTRAMUSCULAR

## 2022-12-09 MED ORDER — ROSUVASTATIN CALCIUM 10 MG PO TABS: 10.0000 mg | ORAL_TABLET | Freq: Every day | ORAL | 3 refills | Status: AC

## 2022-12-09 NOTE — Assessment & Plan Note (Addendum)
Severe OA and right knee which is also notable on prior x-rays.  She is to get left knee x-ray today, although I suspect this will also show chronic changes.  Counseled on conservative pain regimen of Tylenol 1000 mg every 6 hours as needed and ibuprofen 600 mg every 8 hours as needed and Voltaren gel.  Both knees injected today, see procedure below.  I would recommend against further injections given she is having falls from right knee weakness and while injections may be helping this, she may need right knee replacement regardless.  She does follow with orthopedic surgery and I have advised her to return to see them.  Written and verbal consent was obtained after discussing the risks and benefits of the procedure with the patient. A timeout was performed and the correct site and side was identified and confirmed.  The left and right knee was cleaned in sterile fashion betadine and alcohol pad. 1cc 40 mg Depo-medrol and 4 cc 2% Lidocaine was injected using a anterolateral approach using a 5 cc syringe and 25 gauge 1 and 1/2 in needle. No complications were encountered. Minimal blood loss. A band aid was applied.

## 2022-12-09 NOTE — Patient Instructions (Addendum)
It was great to see you today! Thank you for choosing [facility] for your primary care.  Today we addressed: We injected both knees today.  Keep in mind regular injections every 3 months with long-acting steroids can have negative effects.  I recommend you optimize other pain control with Tylenol 1000 mg every 6 hours as needed and ibuprofen 600 mg every 8 hours as needed.  You may also use Voltaren gel.  Should you develop any kind of significant swelling or pain where the knee injection was, please let us know. Please return to see your PCP for healthcare maintenance needs and cholesterol.  I would also recommend discussing your sleep if that is another concern. Please go get your left knee x-ray today.  And make an appointment with your orthopedic surgeon for knee replacement. Your blood pressure is better controlled.  I have sent in your cholesterol medication, please take this daily.  If you haven't already, sign up for My Chart to have easy access to your labs results, and communication with your primary care physician.  Return in about 3 weeks (around 12/30/2022) for Hyperlipidemia and healthcare maintenance. Please arrive 15 minutes before your appointment to ensure smooth check in process.  We appreciate your efforts in making this happen.  Thank you for allowing me to participate in your care, Shelby Mattocks, DO 12/09/2022, 11:27 AM PGY-3, [facility]

## 2022-12-09 NOTE — Assessment & Plan Note (Addendum)
BP: 138/83 today. Well controlled, consider further control for goal of <130/80. Medication regimen: HCTZ 25 mg daily

## 2022-12-09 NOTE — Assessment & Plan Note (Addendum)
Reviewed lipid panel, significantly elevated LDL.  She has now been taking her medications so I have advised she take her rosuvastatin and sent this to pharmacy for her.

## 2022-12-10 ENCOUNTER — Telehealth: Payer: Self-pay | Admitting: Student

## 2022-12-10 NOTE — Telephone Encounter (Signed)
Sue Lush from [facility] called to check on the status of the forms that was faxed on 9/3 and also on 9/13. They are needing the forms as soon as possible.   Please Advise.   Thanks!

## 2022-12-13 ENCOUNTER — Telehealth: Payer: Self-pay | Admitting: Student

## 2022-12-13 NOTE — Telephone Encounter (Signed)
Called and  spoke with patient about paperwork. She note's it the paperwork she needs to continue receiving her personal care services. She has someone come to the house to assist her in the home.   She notes that because of her knees, the pain, and the falls she continues to have, that she needs extra support with things (bathing, cleaning, cooking). She know's that she needs knee replacements bilaterally, but expresses a lot of fear about staying in the hospital. She's concerned she may not make it home, or die there, and thinks about how her mother suddenly died and the effect that had on her.   We discussed need for surgery, no longer receiving knee injections, risk for falls/broken bones/head injuries, and plan for f/u visit in clinic 10/7 for check up/physical.

## 2022-12-13 NOTE — Telephone Encounter (Signed)
Forms completed and placed in the "To Be Faxed" Pile.

## 2022-12-30 ENCOUNTER — Ambulatory Visit: Payer: Medicaid Other | Admitting: Student

## 2023-01-06 ENCOUNTER — Ambulatory Visit: Payer: Medicaid Other | Admitting: Student

## 2023-01-06 NOTE — Progress Notes (Deleted)
    SUBJECTIVE:   Chief compliant/HPI: annual examination  [name] is a 57 y.o. who presents today for an annual exam.   HTN Meds: hydrochlorothiazide 25 mg   History tabs reviewed and updated ***.   Review of systems form reviewed and notable for ***.   OBJECTIVE:   There were no vitals taken for this visit.  ***  ASSESSMENT/PLAN:   No problem-specific Assessment & Plan notes found for this encounter.    Annual Examination  See AVS for age appropriate recommendations  PHQ score ***, reviewed and discussed.  BP reviewed and at goal ***.  Asked about intimate partner violence and resources given as appropriate  Advance directives discussion ***  Considered the following items based upon USPSTF recommendations: Diabetes screening: {discussed/ordered:14545} Screening for elevated cholesterol: {discussed/ordered:14545} HIV testing: {discussed/ordered:14545} Hepatitis C: {discussed/ordered:14545} Neg 03/03/20 Hepatitis B: {discussed/ordered:14545} Syphilis if at high risk: {discussed/ordered:14545} GC/CT {GC/CT screening :23818} Osteoporosis screening considered based upon risk of fracture from [facility] calculator. Major osteoporotic fracture risk is 0.5%. DEXA not ordered.  Reviewed risk factors for latent tuberculosis and {not indicated/requested/declined:14582}   Discussed family history, BRCA testing {not indicated/requested/declined:14582}. Tool used to risk stratify was ***.  Cervical cancer screening: {PAPTYPE:23819} Breast cancer screening: {mammoscreen:23820} Colorectal cancer screening: {crcscreen:23821::"discussed, colonoscopy ordered"} Lung cancer screening: {discussed/declined/written info:19698}. See documentation below regarding indications/risks/benefits.  Vaccinations ***.   Follow up in 1 *** year or sooner if indicated.    Cathy Kinds, MD [facility]

## 2023-02-10 ENCOUNTER — Telehealth: Payer: Self-pay | Admitting: Pharmacist

## 2023-02-10 DIAGNOSIS — F17200 Nicotine dependence, unspecified, uncomplicated: Secondary | ICD-10-CM

## 2023-02-10 NOTE — Telephone Encounter (Signed)
Patient contacted for follow/up of tobacco intake reduction / cessation attempt.   Since last contact in August (3 months ago) patient reports increased stress and increase in smoking cigarettes again. She reports smoking 4-5  cigarettes per day.  She is not interested in working on intake reduction currently however she was will for me to follow-up in ~ 2 months.   Total time with patient call and documentation of interaction: 11 minutes. Follow-up phone call planned: 2 months

## 2023-02-10 NOTE — Assessment & Plan Note (Signed)
Patient contacted for follow/up of tobacco intake reduction / cessation attempt.   Since last contact in August (3 months ago) patient reports increased stress and increase in smoking cigarettes again. She reports smoking 4-5  cigarettes per day.  She is not interested in working on intake reduction currently however she was will for me to follow-up in ~ 2 months.

## 2023-02-10 NOTE — Telephone Encounter (Signed)
Reviewed and agree with Dr Koval's plan.   

## 2023-02-15 ENCOUNTER — Other Ambulatory Visit: Payer: Self-pay | Admitting: Student

## 2023-02-17 ENCOUNTER — Other Ambulatory Visit: Payer: Self-pay | Admitting: Student

## 2023-02-17 DIAGNOSIS — I10 Essential (primary) hypertension: Secondary | ICD-10-CM

## 2023-02-17 MED ORDER — HYDROCHLOROTHIAZIDE 25 MG PO TABS: 25.0000 mg | ORAL_TABLET | Freq: Every day | ORAL | 2 refills | Status: AC

## 2023-03-04 ENCOUNTER — Ambulatory Visit: Payer: Medicaid Other | Admitting: Student

## 2023-03-07 ENCOUNTER — Other Ambulatory Visit: Payer: Self-pay | Admitting: Student

## 2023-03-08 ENCOUNTER — Other Ambulatory Visit: Payer: Self-pay | Admitting: Student

## 2023-03-08 DIAGNOSIS — K219 Gastro-esophageal reflux disease without esophagitis: Secondary | ICD-10-CM

## 2023-03-08 MED ORDER — PANTOPRAZOLE SODIUM 40 MG PO TBEC: 40.0000 mg | DELAYED_RELEASE_TABLET | Freq: Two times a day (BID) | ORAL | 0 refills | Status: DC

## 2023-03-08 NOTE — Progress Notes (Signed)
Patient called regarding refill of pantoprazole stating she has been out for the last couple days and has been greatly uncomfortable and having reflux-like symptoms because of this.  She reach out to the pharmacy for refill however they stated that she needed a prescription for this.  There is a pended prescription from pharmacy to our system approximately 1 hour ago.  I have granted requested patient to refill this for 1 month, verified pharmacy.  I have counseled her on use of emergency after-hours line for medication refills and to please be vigilant of shortage of medication several days prior to running out so she is able to get her refill request on time.  She was thankful for call back.

## 2023-03-10 ENCOUNTER — Other Ambulatory Visit: Payer: Self-pay | Admitting: Student

## 2023-03-10 DIAGNOSIS — K219 Gastro-esophageal reflux disease without esophagitis: Secondary | ICD-10-CM

## 2023-03-17 ENCOUNTER — Ambulatory Visit: Payer: Medicaid Other | Admitting: Family Medicine

## 2023-03-31 ENCOUNTER — Ambulatory Visit: Payer: Medicaid Other | Admitting: Family Medicine

## 2023-05-05 ENCOUNTER — Ambulatory Visit: Payer: Medicaid Other | Admitting: Student

## 2023-05-05 NOTE — Progress Notes (Deleted)
  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  Last Assessment & Plan:  Relevant Hx: Course: Daily Update: Today's Plan:    Congestion of nasal sinus 08/03/2018   Depression    ECZEMA, ATOPIC DERMATITIS 08/25/2007   Qualifier: Diagnosis of  By: Seleta Rhymes MD, Mark     GERD (gastroesophageal reflux disease)    Hypopigmented skin lesion 06/22/2019   Impingement syndrome of right shoulder 04/16/2019   IRREGULAR MENSES 11/03/2007   Qualifier: Diagnosis of  By: Jennette Kettle MD, Huntley Dec     LOW BACK PAIN, CHRONIC 06/30/2006   Qualifier: Diagnosis of  By: Seleta Rhymes MD, Mark     Osteoarthritis of knee 03/05/2012   OVARIAN CYST 08/12/2007   Qualifier: Diagnosis of  By: Seleta Rhymes MD, Mark     Panic attacks    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 Physical Exam   ASSESSMENT/PLAN:   Assessment & Plan  No follow-ups on file. [name], MD 05/05/2023, 6:37 AM PGY-***, [facility] {    This will disappear when note is signed, click to select method of visit    :1}

## 2023-05-12 ENCOUNTER — Ambulatory Visit: Payer: Medicaid Other | Admitting: Student

## 2023-05-12 ENCOUNTER — Telehealth: Payer: Self-pay | Admitting: Pharmacist

## 2023-05-12 ENCOUNTER — Encounter: Payer: Self-pay | Admitting: Pharmacist

## 2023-05-12 DIAGNOSIS — F17200 Nicotine dependence, unspecified, uncomplicated: Secondary | ICD-10-CM

## 2023-05-12 NOTE — Progress Notes (Deleted)
    SUBJECTIVE:   Chief compliant/HPI: annual examination  [name] is a 57 y.o. who presents today for an annual exam.    History tabs reviewed and updated ***.   Review of systems form reviewed and notable for ***.   OBJECTIVE:   There were no vitals taken for this visit.  ***  ASSESSMENT/PLAN:   No problem-specific Assessment & Plan notes found for this encounter.    Annual Examination  See AVS for age appropriate recommendations  PHQ score ***, reviewed and discussed.  BP reviewed and at goal ***.  Asked about intimate partner violence and resources given as appropriate  Advance directives discussion ***  Considered the following items based upon USPSTF recommendations: Diabetes screening: discussed and ordered Screening for elevated cholesterol: discussed and ordered HIV testing: {discussed/ordered:14545} Hepatitis C: {discussed/ordered:14545} Neg 03/03/20 Hepatitis B: discussed and ordered Syphilis if at high risk: {discussed/ordered:14545} GC/CT {GC/CT screening :23818} Osteoporosis screening considered based upon risk of fracture from [facility] calculator. Major osteoporotic fracture risk is ***%. DEXA {ordered not order:23822}.  Reviewed risk factors for latent tuberculosis and {not indicated/requested/declined:14582}   Discussed family history, BRCA testing {not indicated/requested/declined:14582}. Tool used to risk stratify was ***.  Cervical cancer screening: prior Pap reviewed, repeat due in 1.5 years Breast cancer screening: discussed potential benefits, risks including overdiagnosis and biopsy, elected proceed with mammogram Colorectal cancer screening: {crcscreen:23821::"discussed, colonoscopy ordered"} Lung cancer screening: {discussed/declined/written info:19698}. See documentation below regarding indications/risks/benefits.  Vaccinations Due for Tdap, Covid, Pneumococcal, shingrix.   Follow up in 1 *** year or sooner if indicated.    [name], MD [facility]

## 2023-05-12 NOTE — Assessment & Plan Note (Signed)
Patient contacted for follow/up of tobacco cessation - QUIT X 3 weeks!!!  Since last contact patient reports she has quit smoking for three weeks.  She reports that her smoking aggravated her neck pain/swelling and she was concerned.  She has appointment for evaluation here at [facility] in 1 week.   Medications currently being used; None currently  Patient denies any significant side effects from tobacco cessation therapy.   Rates CONFIDENCE in remaining quit from tobacco as high. Changed tobacco substance use history to former.

## 2023-05-12 NOTE — Patient Instructions (Incomplete)
It was great to see you! Thank you for allowing me to participate in your care!  I recommend that you always bring your medications to each appointment as this makes it easy to ensure we are on the correct medications and helps [place] not miss when refills are needed.  Our plans for today:  - Check Up  Screening for Cholesterol, diabetes, Hepatitis B  - Health Maintenance   Due for:   Colonoscopy for colon cancer   Mammogram for breast cancer   CT Chest for lung cancer  - Vaccinations   Due for: Tdap, Covid, Shingles, Pneumococcal  We are checking some labs today, I will call you if they are abnormal will send you a MyChart message or a letter if they are normal.  If you do not hear about your labs in the next 2 weeks please let us know.***  Take care and seek immediate care sooner if you develop any concerns.   Dr. [name], MD [facility]

## 2023-05-12 NOTE — Telephone Encounter (Signed)
Patient contacted for follow/up of tobacco cessation - QUIT X 3 weeks!!!  Since last contact patient reports she has quit smoking for three weeks.  She reports that her smoking aggravated her neck pain/swelling and she was concerned.  She has appointment for evaluation here at [facility] in 1 week.   Medications currently being used; None currently  Patient denies any significant side effects from tobacco cessation therapy.   Rates CONFIDENCE in remaining quit from tobacco as high. Changed tobacco substance use history to former.     Patient is participating in a Managed Medicaid Plan:  Yes  Total time with patient call and documentation of interaction: 9 minutes. Follow-up phone call planned: NONE

## 2023-05-13 NOTE — Telephone Encounter (Signed)
 Reviewed and agree with Dr Macky Lower plan.

## 2023-05-19 ENCOUNTER — Encounter: Payer: Self-pay | Admitting: Student

## 2023-05-19 ENCOUNTER — Ambulatory Visit (INDEPENDENT_AMBULATORY_CARE_PROVIDER_SITE_OTHER): Payer: Medicaid Other | Admitting: Student

## 2023-05-19 VITALS — BP 150/84 | HR 67 | Ht 75.0 in | Wt 249.4 lb

## 2023-05-19 DIAGNOSIS — M545 Low back pain, unspecified: Secondary | ICD-10-CM

## 2023-05-19 DIAGNOSIS — G8929 Other chronic pain: Secondary | ICD-10-CM | POA: Diagnosis not present

## 2023-05-19 DIAGNOSIS — L732 Hidradenitis suppurativa: Secondary | ICD-10-CM

## 2023-05-19 MED ORDER — TIZANIDINE HCL 4 MG PO TABS: 4.0000 mg | ORAL_TABLET | Freq: Three times a day (TID) | ORAL | 0 refills | Status: DC | PRN

## 2023-05-19 MED ORDER — DOXYCYCLINE HYCLATE 100 MG PO TABS: 100.0000 mg | ORAL_TABLET | Freq: Two times a day (BID) | ORAL | 0 refills | Status: AC

## 2023-05-19 MED ORDER — CHLORHEXIDINE GLUCONATE 4 % EX SOLN: Freq: Every day | CUTANEOUS | 2 refills | Status: AC | PRN

## 2023-05-19 NOTE — Patient Instructions (Addendum)
 Ms. Mones,  It is great to meet you. Let's treat this with antibiotics. I am sending in a 2 week course of antibiotics for you. Come back to see [place] if this does not take care things and we may need a longer duration. I am also sending some antiseptic wash that you can use once you finish the antibiotics to prevent future flares.  I do not think you are to the point where the cost and risks of the shots makes sense, but if we have difficulty getting things under control with antibiotics, we can consider going down this route.   Lifestyle Modifications:  Clothing: Wear loose-fitting, breathable, and absorbent fabrics to reduce friction and irritation.  Hygiene: Use antiseptic washes like chlorhexidine or benzoyl peroxide to reduce bacterial colonization  Weight Management: Achieve and maintain a healthy weight to reduce mechanical stress on affected areas.  Smoking Cessation: Quit smoking to decrease the severity and frequency of HS flares.   Eliezer Mccoy, MD

## 2023-05-19 NOTE — Progress Notes (Unsigned)
    SUBJECTIVE:   CHIEF COMPLAINT / HPI:   Hidradenitis Suppurativa Carries a diagnosis of hidradenitis.  Sounds like she has relatively infrequent flares.  She is unsure when last time that she needed antibiotics was.  Her lesions are primarily in the groin, though she also occasionally gets lesions in her armpits.  Does not really get lesions under her breasts.  She tries to prevent flares by avoiding shaving and wearing loosefitting clothing when she is around the house.  The present lesions have been there for about 10 days now.  Some are draining spontaneously and others are not yet doing so. Her daughter also has hidradenitis and takes Humira for it.  She is wondering if this would be an option for her as well.  HTN BP above goal x2 today. She tells me that she did not take her meds this morning.  Pertinent PMH Bipolar disorder, HTN, Hidradenitis Suppurativa  OBJECTIVE:   BP (!) 150/84   Pulse 67   Ht 6\' 3"  (1.905 m)   Wt 249 lb 6.4 oz (113.1 kg)   SpO2 100%   BMI 31.17 kg/m     ASSESSMENT/PLAN:   No problem-specific Assessment & Plan notes found for this encounter.     Eliezer Mccoy, MD [facility]

## 2023-05-20 NOTE — Assessment & Plan Note (Signed)
 It seems likely she will need to be on some form of chronic therapy going forward. Hesitate to remain on flexeril or narcotic agents as she ages and is at increased risk for adverse drug events. - Tizanidine 4mg  q8h PRN

## 2023-05-20 NOTE — Assessment & Plan Note (Signed)
 HS flare. Thankfully these are somewhat infrequent for her. She cannot remember the last time she needed systemic antibiotics.  - Doxycycline 100mg  BID x2 weeks. May need a longer course, she will return to care if things do not improve as expected  - Chlorhexidine Rinse sent in to help prevent future flares

## 2023-06-19 ENCOUNTER — Other Ambulatory Visit: Payer: Self-pay | Admitting: Student

## 2023-06-19 MED ORDER — FLUCONAZOLE 150 MG PO TABS: 150.0000 mg | ORAL_TABLET | Freq: Once | ORAL | 0 refills | Status: AC

## 2023-06-19 NOTE — Progress Notes (Signed)
 Pt comes in w/ daughter complaining of yeast infection following Abx for HS 05/20/23. Patient noticing vaginal discharge and irritation and reports it's simmilar to previous yeast infections she's had.   Will send in 1 time dose of diflucan. Patient notes she is not sexually active and was informed about risk to baby if she was.

## 2023-07-01 ENCOUNTER — Ambulatory Visit

## 2023-07-10 ENCOUNTER — Other Ambulatory Visit: Payer: Self-pay | Admitting: Student

## 2023-07-10 DIAGNOSIS — G8929 Other chronic pain: Secondary | ICD-10-CM

## 2023-07-10 MED ORDER — TIZANIDINE HCL 4 MG PO TABS: 4.0000 mg | ORAL_TABLET | Freq: Three times a day (TID) | ORAL | 0 refills | Status: DC | PRN

## 2023-07-10 NOTE — Progress Notes (Signed)
 Called and spoke with patient daughter, who was with patient's mother [name]. She notes significant back pain and needing a refill on her muscle relaxer from February.   I told patient we would need her to come in and see about her back, but that I would send a short course of muscle relaxer for her.   Patient agreed to come in.   Refilled tizanidine for 15 pills.

## 2023-07-30 ENCOUNTER — Other Ambulatory Visit: Payer: Self-pay | Admitting: Student

## 2023-07-30 DIAGNOSIS — K219 Gastro-esophageal reflux disease without esophagitis: Secondary | ICD-10-CM

## 2023-09-02 ENCOUNTER — Encounter: Payer: Self-pay | Admitting: *Deleted

## 2023-10-01 ENCOUNTER — Other Ambulatory Visit: Payer: Self-pay | Admitting: Student

## 2023-10-01 DIAGNOSIS — G8929 Other chronic pain: Secondary | ICD-10-CM

## 2023-10-03 ENCOUNTER — Ambulatory Visit (HOSPITAL_COMMUNITY)
Admission: EM | Admit: 2023-10-03 | Discharge: 2023-10-04 | Disposition: A | Discharge status: Home / Self Care | Attending: Internal Medicine | Admitting: Internal Medicine

## 2023-10-03 ENCOUNTER — Ambulatory Visit (INDEPENDENT_AMBULATORY_CARE_PROVIDER_SITE_OTHER)

## 2023-10-03 DIAGNOSIS — M545 Low back pain, unspecified: Secondary | ICD-10-CM

## 2023-10-03 DIAGNOSIS — M25552 Pain in left hip: Secondary | ICD-10-CM

## 2023-10-03 MED ORDER — CYCLOBENZAPRINE HCL 10 MG PO TABS: 10.0000 mg | ORAL_TABLET | Freq: Three times a day (TID) | ORAL | 0 refills | Status: DC | PRN

## 2023-10-03 MED ORDER — KETOROLAC TROMETHAMINE 30 MG/ML IJ SOLN
INTRAMUSCULAR | Status: AC
  Filled 2023-10-03: qty 1

## 2023-10-03 MED ORDER — KETOROLAC TROMETHAMINE 30 MG/ML IJ SOLN
30.0000 mg | Freq: Once | INTRAMUSCULAR | Status: AC
  Administered 2023-10-03: 30 mg via INTRAMUSCULAR

## 2023-10-03 MED ORDER — PREDNISONE 20 MG PO TABS: 40.0000 mg | ORAL_TABLET | Freq: Every day | ORAL | 0 refills | Status: AC

## 2023-10-03 NOTE — ED Triage Notes (Signed)
 Pt c/o pain and swelling to L hip/thigh s/p near fall yesterday. States she tripped over something at the store, causing her body to twist. Pain worse today.

## 2023-10-03 NOTE — Discharge Instructions (Addendum)
 X-ray of the left hip and lumbar back done today.  Final evaluation by the radiologist shows no acute fractures but there is some arthritic changes that likely is causing inflammation. We will treat with the following:  Toradol  injection given today. This is a medication to help with pain. This is not a narcotic.  Start tomorrow: Prednisone  40 mg (2 tablets) once daily for 5 days. Take this in the morning.  This is a steroid to help with inflammation and pain. Flexeril  10 mg every 8 hours as needed for muscle spasms.  Use caution as this medication can cause drowsiness.  Ice the area 2-3 times daily for 10-15 minutes to help with pain and swelling. Do not apply ice directly to the skin.  May try heat as well to help with the symptoms.   If symptoms fail to improve then recommend following up with orthopedics May return to urgent care as needed

## 2023-10-03 NOTE — ED Provider Notes (Addendum)
 [facility]    CSN: [id number] Arrival date & time: 10/03/23  1938      History   Chief Complaint Chief Complaint  Patient presents with   Hip Pain    HPI [name] is a 58 y.o. female.   58 year old female who presents urgent care with complaints of left lower back and left hip pain.  She reports that yesterday she was at the store and tripped over a bag of close from a homeless person.  She had to twist to catch herself and keep herself from falling completely down and felt pain in the left lower back and left hip.  She reports that this morning the pain was much worse and she had swelling down into her lower leg.  She denies any locking or giving out.  She uses a cane to ambulate due to chronic lower back pain but reports it has been more difficult to ambulate since this.  She reports that through the night the pain was so severe that she took three 300 mg gabapentin 's and Zanaflex .  This did not really help much.  She has continued to have significant pain today.  She denies any bowel or bladder incontinence   Hip Pain Pertinent negatives include no chest pain, no abdominal pain and no shortness of breath.    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   Overview:  Grade II  Last Assessment & Plan:  Relevant Hx: Course: Daily Update: Today's Plan:    Congestion of nasal sinus 08/03/2018   Depression    ECZEMA, ATOPIC DERMATITIS 08/25/2007   Qualifier: Diagnosis of  By: Xavier MD, Oneil     GERD (gastroesophageal reflux disease)    Hypopigmented skin lesion 06/22/2019   Impingement syndrome of right shoulder 04/16/2019   IRREGULAR MENSES 11/03/2007   Qualifier: Diagnosis of  By: Rosalynn MD, Camie     LOW BACK PAIN, CHRONIC 06/30/2006   Qualifier: Diagnosis of  By: Xavier MD, Mark     Osteoarthritis of knee 03/05/2012   OVARIAN CYST 08/12/2007    Qualifier: Diagnosis of  By: Xavier MD, Mark     Panic attacks    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   TOBACCO ABUSE 04/13/2008   VARICOSE VEINS, LOWER EXTREMITIES 06/30/2006   Qualifier: Diagnosis of  By: Xavier MD, Mark      Patient Active Problem List   Diagnosis Date Noted   Prediabetes 12/02/2022   Left hand pain 04/26/2022   Laceration of left ankle 01/11/2022   Hypertension 07/31/2021   Hyperlipidemia 04/23/2020   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      Home Medications    Prior to Admission medications   Medication Sig Start Date End Date Taking? Authorizing Provider  cyclobenzaprine  (FLEXERIL )  10 MG tablet Take 1 tablet (10 mg total) by mouth every 8 (eight) hours as needed for muscle spasms. 10/03/23  Yes Lenord Fralix A, PA-C  predniSONE  (DELTASONE ) 20 MG tablet Take 2 tablets (40 mg total) by mouth daily with breakfast for 5 days. 10/03/23 10/08/23 Yes Roshanda Balazs A, PA-C  albuterol  (VENTOLIN  HFA) 108 (90 Base) MCG/ACT inhaler Inhale 2 puffs into the lungs every 4 (four) hours as needed for wheezing or shortness of breath. 11/01/22   McDiarmid, Todd D, MD  ALPRAZolam (XANAX) 1 MG tablet Take 1 mg by mouth 4 (four) times daily as needed for anxiety. For anxiety    [provider]  chlorhexidine  (HIBICLENS ) 4 % external liquid Apply topically daily as needed. 05/19/23   Marlee Lynwood NOVAK, MD  diclofenac   Sodium (VOLTAREN ) 1 % GEL Apply 4 g topically 4 (four) times daily. 12/31/19   Anders Otto DASEN, MD  divalproex (DEPAKOTE) 250 MG DR tablet Take 250 mg by mouth 3 (three) times daily.     [provider]  gabapentin  (NEURONTIN ) 300 MG capsule TAKE 3 CAPSULES(900 MG) BY MOUTH THREE TIMES DAILY 05/19/20   Hope Merle, MD  hydrochlorothiazide  (HYDRODIURIL ) 25 MG tablet Take 1 tablet (25 mg total) by mouth daily. 02/17/23   Sowell, Brandon, MD  mometasone-formoterol (DULERA ) 200-5 MCG/ACT AERO Inhale 2 puffs into the lungs 2 (two) times daily. 11/01/22   McDiarmid, Krystal BIRCH, MD  pantoprazole  (PROTONIX ) 40 MG tablet TAKE 1 TABLET(40 MG) BY MOUTH TWICE DAILY 07/31/23   Jennelle Riis, MD  QUEtiapine (SEROQUEL) 100 MG tablet Take 100 mg by mouth at bedtime. 04/18/20   [provider]  rosuvastatin  (CRESTOR ) 10 MG tablet Take 1 tablet (10 mg total) by mouth daily. 12/09/22   Orlando Pond, DO  VRAYLAR 1.5 MG capsule Take 1.5 mg by mouth daily. 11/13/21   [provider]    Family History Family History  Problem Relation Age of Onset   Hypertension Mother    Diabetes Mother    Diabetes Father    Hypertension Father     Social History Social History   Tobacco Use   Smoking status: Former    Current packs/day: 0.00    Average packs/day: 0.5 packs/day for 38.5 years (19.3 ttl pk-yrs)    Types: Cigarettes    Start date: 03/25/1981    Quit date: 10/07/2019    Years since quitting: 3.9    Passive exposure: Current   Smokeless tobacco: Never  Vaping Use   Vaping status: Never Used  Substance Use Topics   Alcohol use: Yes   Drug use: Yes    Types: Marijuana     Allergies   Cetirizine  & related and Hyaluronic acid [sodium hyaluronate (avian)]   Review of Systems Review of Systems  Constitutional:  Negative for chills and fever.  HENT:  Negative for ear pain and sore throat.   Eyes:  Negative for pain and visual disturbance.  Respiratory:  Negative for cough and  shortness of breath.   Cardiovascular:  Negative for chest pain and palpitations.  Gastrointestinal:  Negative for abdominal pain and vomiting.  Genitourinary:  Negative for dysuria and hematuria.  Musculoskeletal:  Positive for back pain. Negative for arthralgias.       Left hip pain, left leg swelling  Skin:  Negative for color change and rash.  Neurological:  Negative for seizures and syncope.  All other systems reviewed and are negative.    Physical Exam Triage Vital Signs ED Triage  Vitals [10/03/23 1955]  Encounter Vitals Group     BP (!) 171/92     Girls Systolic BP Percentile      Girls Diastolic BP Percentile      Boys Systolic BP Percentile      Boys Diastolic BP Percentile      Pulse Rate 71     Resp 18     Temp 98 F (36.7 C)     Temp Source Oral     SpO2 95 %     Weight      Height      Head Circumference      Peak Flow      Pain Score      Pain Loc      Pain Education      Exclude from Growth Chart    No data found.  Updated Vital Signs BP (!) 171/92   Pulse 71   Temp 98 F (36.7 C) (Oral)   Resp 18   SpO2 95%   Visual Acuity Right Eye Distance:   Left Eye Distance:   Bilateral Distance:    Right Eye Near:   Left Eye Near:    Bilateral Near:     Physical Exam Vitals and nursing note reviewed.  Constitutional:      General: She is not in acute distress.    Appearance: She is well-developed.  HENT:     Head: Normocephalic and atraumatic.  Eyes:     Conjunctiva/sclera: Conjunctivae normal.  Cardiovascular:     Rate and Rhythm: Normal rate and regular rhythm.     Heart sounds: No murmur heard. Pulmonary:     Effort: Pulmonary effort is normal. No respiratory distress.     Breath sounds: Normal breath sounds.  Abdominal:     Palpations: Abdomen is soft.     Tenderness: There is no abdominal tenderness.  Musculoskeletal:        General: No swelling.     Cervical back: Neck supple.       Back:     Left hip: Tenderness present. No  deformity or bony tenderness. Decreased range of motion. Normal strength.     Left knee: Swelling present. Decreased range of motion (chronic).     Left foot: Normal pulse.  Skin:    General: Skin is warm and dry.     Capillary Refill: Capillary refill takes less than 2 seconds.  Neurological:     Mental Status: She is alert.  Psychiatric:        Mood and Affect: Mood normal.      UC Treatments / Results  Labs (all labs ordered are listed, but only abnormal results are displayed) Labs Reviewed - No data to display  EKG   Radiology DG Lumbar Spine Complete Result Date: 10/03/2023 CLINICAL DATA:  Fall with lower back pain EXAM: LUMBAR SPINE - COMPLETE 4+ VIEW COMPARISON:  CT lumbar spine 05/24/2021 FINDINGS: No evidence of acute fracture or traumatic listhesis. No significant intervertebral disc space height loss. Advanced facet arthropathy at L4-L5 and L5-S1. IMPRESSION: 1. No acute fracture or traumatic listhesis. 2. Advanced facet arthropathy at L4-L5 and L5-S1. Electronically Signed   By: Norman Gatlin M.D.   On: 10/03/2023 20:39   DG Hip Unilat With Pelvis 2-3 Views Left Result Date: 10/03/2023 CLINICAL DATA:  Left hip pain EXAM: DG HIP (WITH OR WITHOUT PELVIS) 2-3V LEFT COMPARISON:  None are available FINDINGS: No acute fracture or dislocation. Degenerative changes pubic symphysis, both hips, SI  joints and lower lumbar spine. IMPRESSION: No acute fracture or dislocation. Electronically Signed   By: Norman Gatlin M.D.   On: 10/03/2023 20:38    Procedures Procedures (including critical care time)  Medications Ordered in UC Medications  ketorolac  (TORADOL ) 30 MG/ML injection 30 mg (30 mg Intramuscular Given 10/03/23 2025)    Initial Impression / Assessment and Plan / UC Course  I have reviewed the triage vital signs and the nursing notes.  Pertinent labs & imaging results that were available during my care of the patient were reviewed by me and considered in my medical  decision making (see chart for details).     Left hip pain - Plan: DG Hip Unilat With Pelvis 2-3 Views Left, DG Hip Unilat With Pelvis 2-3 Views Left  Lumbar back pain - Plan: DG Lumbar Spine Complete, DG Lumbar Spine Complete   X-ray of the left hip and lumbar back done today.  Final evaluation by the radiologist shows no acute fractures but there is some arthritic changes that likely is causing inflammation. We will treat with the following:  Toradol  injection given today. This is a medication to help with pain. This is not a narcotic.  Start tomorrow: Prednisone  40 mg (2 tablets) once daily for 5 days. Take this in the morning.  This is a steroid to help with inflammation and pain. Flexeril  10 mg every 8 hours as needed for muscle spasms.  Use caution as this medication can cause drowsiness.  Ice the area 2-3 times daily for 10-15 minutes to help with pain and swelling. Do not apply ice directly to the skin.  May try heat as well to help with the symptoms.   If symptoms fail to improve then recommend following up with orthopedics May return to urgent care as needed  Final Clinical Impressions(s) / UC Diagnoses   Final diagnoses:  Left hip pain  Lumbar back pain     Discharge Instructions      X-ray of the left hip and lumbar back done today.  Final evaluation by the radiologist shows no acute fractures but there is some arthritic changes that likely is causing inflammation. We will treat with the following:  Toradol  injection given today. This is a medication to help with pain. This is not a narcotic.  Start tomorrow: Prednisone  40 mg (2 tablets) once daily for 5 days. Take this in the morning.  This is a steroid to help with inflammation and pain. Flexeril  10 mg every 8 hours as needed for muscle spasms.  Use caution as this medication can cause drowsiness.  Ice the area 2-3 times daily for 10-15 minutes to help with pain and swelling. Do not apply ice directly to the skin.  May  try heat as well to help with the symptoms.   If symptoms fail to improve then recommend following up with orthopedics May return to urgent care as needed     ED Prescriptions     Medication Sig Dispense Auth. Provider   predniSONE  (DELTASONE ) 20 MG tablet Take 2 tablets (40 mg total) by mouth daily with breakfast for 5 days. 10 tablet Teresa Norris A, PA-C   cyclobenzaprine  (FLEXERIL ) 10 MG tablet Take 1 tablet (10 mg total) by mouth every 8 (eight) hours as needed for muscle spasms. 30 tablet Teresa Norris LABOR, [place]      PDMP not reviewed this encounter.   Teresa Norris LABOR [place] 10/03/23 2047    Teresa Norris LABOR, PA-C 10/03/23 2048

## 2023-10-06 ENCOUNTER — Telehealth: Payer: Self-pay

## 2023-10-06 NOTE — Telephone Encounter (Signed)
 [name] with [facility] calls nurse line in regards to personal care services.  She reports the patient currently has PCS, however she is requesting additional hours.   Patient has not been seen since February 2025. Patient will need an office visit to support PCS hours increase.   [name] reports she will inform patient she needs an apt.   Nothing further needed at this time.

## 2023-10-08 ENCOUNTER — Ambulatory Visit (INDEPENDENT_AMBULATORY_CARE_PROVIDER_SITE_OTHER): Admitting: Student

## 2023-10-08 ENCOUNTER — Encounter: Payer: Self-pay | Admitting: Student

## 2023-10-08 VITALS — BP 159/89 | HR 73 | Ht 75.0 in | Wt 246.0 lb

## 2023-10-08 DIAGNOSIS — M25552 Pain in left hip: Secondary | ICD-10-CM | POA: Insufficient documentation

## 2023-10-08 DIAGNOSIS — I10 Essential (primary) hypertension: Secondary | ICD-10-CM

## 2023-10-08 NOTE — Patient Instructions (Signed)
 It was great to see you! Thank you for allowing me to participate in your care!  I recommend that you always bring your medications to each appointment as this makes it easy to ensure we are on the correct medications and helps us  not miss when refills are needed.  Our plans for today:  - Hip Pain  Continue muscle relaxer and steroid. Can take 1-2 weeks for pain to improve  Follow up if pain not improving, or getting worse. May send you to Physical Therapy  -  Urinary Incontinence I recommend you practice Kegel exercises which will strengthen your pelvic floor and the muscles around your urethra, so you have less episodes. If this does not improve things, make follow up appointment, we may need to send you to a specialist.   - High Blood Pressure Make follow up for blood pressure recheck, and we will see if you need to be on any medication.  Take care and seek immediate care sooner if you develop any concerns.   Dr. Penne Rhein, MD [facility]

## 2023-10-08 NOTE — Progress Notes (Signed)
  SUBJECTIVE:   CHIEF COMPLAINT / HPI:   Hip Pain Fell last week while at the store and landed on left hip/leg. Went to UC and had X-ray lumbar/hip that was normal. Was given muscle relaxer and steroid. Still in pain, difficult to get up from seated.   HTN Meds: hydrochlorothiazide  25 mg Today: Notes BP elevated 2/2 to pain, she does not take her BP medicine.  Urinary incontinence No burning or frequent urination. Notes that she has accidents at night. Will try to get to the bathroom when she feels she has to pee, and sometimes will have an accident. Been an issue for 2 months. No fevers or chills, normal state of health.    PERTINENT  PMH / PSH:   OBJECTIVE:  BP (!) 164/84   Pulse 73   Ht 6' 3 (1.905 m)   Wt 246 lb (111.6 kg)   SpO2 100%   BMI 30.75 kg/m  Physical Exam Musculoskeletal:     Lumbar back: Tenderness and bony tenderness present. No swelling, edema, deformity, signs of trauma, lacerations or spasms. Decreased range of motion. Negative left straight leg raise test. No scoliosis.      ASSESSMENT/PLAN:   Assessment & Plan Left hip pain Patient comes in for follow up of left hip pain after fall.  Patient fell last week while at grocery store, tripped over something in the aisle, fell on her left side.  Began having left-sided hip pain and back pain in the lumbar area.  Patient went to urgent care, had negative lumbar/hip x-ray, was given muscle relaxer and steroid.  Patient appreciates she continues to have pain, is tender to palpation midline, and in the lumbar, and partially on the hip.  She has restricted range of motion secondary to pain.  Discussed that she would likely be sore for the next 1 to 2 weeks before things improve.  Patient in understanding.  If pain not improved, will send to physical therapy. - Continue muscle relaxer/steroid - Follow-up as needed - Consider PT if pain not improving Hypertension, unspecified type Patient comes in for follow-up of  her blood pressure.  Patient reports she has not taken her blood pressure medicine.  Patient believes her blood pressure is usually well-controlled, but is high today because she is in pain.  Will encourage patient to take medication, and follow-up for BP recheck - Restart HCTZ 25 mg - Follow-up for BP recheck No follow-ups on file. Penne Rhein, MD 10/08/2023, 10:55 AM PGY-3, [facility]

## 2023-10-08 NOTE — Assessment & Plan Note (Addendum)
 Patient comes in for follow-up of her blood pressure.  Patient reports she has not taken her blood pressure medicine.  Patient believes her blood pressure is usually well-controlled, but is high today because she is in pain.  Will encourage patient to take medication, and follow-up for BP recheck - Restart HCTZ 25 mg - Follow-up for BP recheck

## 2023-10-08 NOTE — Assessment & Plan Note (Addendum)
 Patient comes in for follow up of left hip pain after fall.  Patient fell last week while at grocery store, tripped over something in the aisle, fell on her left side.  Began having left-sided hip pain and back pain in the lumbar area.  Patient went to urgent care, had negative lumbar/hip x-ray, was given muscle relaxer and steroid.  Patient appreciates she continues to have pain, is tender to palpation midline, and in the lumbar, and partially on the hip.  She has restricted range of motion secondary to pain.  Discussed that she would likely be sore for the next 1 to 2 weeks before things improve.  Patient in understanding.  If pain not improved, will send to physical therapy. - Continue muscle relaxer/steroid - Follow-up as needed - Consider PT if pain not improving

## 2023-10-16 ENCOUNTER — Telehealth: Payer: Self-pay | Admitting: Pharmacist

## 2023-10-16 MED ORDER — VARENICLINE TARTRATE 1 MG PO TABS: 1.0000 mg | ORAL_TABLET | Freq: Two times a day (BID) | ORAL | 1 refills | Status: AC

## 2023-10-16 NOTE — Telephone Encounter (Signed)
 Patient contacted for follow/up of tobacco intake reduction / cessation attempt.   Since last contact patient reports death of her brother in 10/22/2023 and relapsed.  Reports currently smoking 2-3 cigs per day.   Medications currently being used;  None but request restart of Varenicline  - 1mg  BID Prescription provided.   Patient educated on purpose, proper use and potential adverse effects of GI.    Most common triggers to use tobacco include; Grief   Total time with patient call and documentation of interaction: 12 minutes. Follow-up phone call planned: 6 weeks

## 2023-10-16 NOTE — Telephone Encounter (Signed)
-----   Message from Maude Lagos sent at 05/12/2023  4:09 PM EST ----- Regarding: Tobaco Cessation long-term quit? Quit January - 3rd week 2025  Still quit?

## 2023-10-17 NOTE — Telephone Encounter (Signed)
 Reviewed and agree with Dr Macky Lower plan.

## 2023-10-28 ENCOUNTER — Encounter: Payer: Self-pay | Admitting: Family Medicine

## 2023-10-28 ENCOUNTER — Ambulatory Visit: Payer: Self-pay | Admitting: *Deleted

## 2023-10-28 ENCOUNTER — Ambulatory Visit (INDEPENDENT_AMBULATORY_CARE_PROVIDER_SITE_OTHER): Admitting: Family Medicine

## 2023-10-28 VITALS — BP 136/85 | HR 73 | Temp 97.9°F | Wt 245.6 lb

## 2023-10-28 DIAGNOSIS — J441 Chronic obstructive pulmonary disease with (acute) exacerbation: Secondary | ICD-10-CM | POA: Diagnosis present

## 2023-10-28 DIAGNOSIS — B9689 Other specified bacterial agents as the cause of diseases classified elsewhere: Secondary | ICD-10-CM | POA: Diagnosis not present

## 2023-10-28 DIAGNOSIS — S61012A Laceration without foreign body of left thumb without damage to nail, initial encounter: Secondary | ICD-10-CM

## 2023-10-28 DIAGNOSIS — Z716 Tobacco abuse counseling: Secondary | ICD-10-CM | POA: Diagnosis not present

## 2023-10-28 DIAGNOSIS — J4489 Other specified chronic obstructive pulmonary disease: Secondary | ICD-10-CM | POA: Diagnosis not present

## 2023-10-28 DIAGNOSIS — N760 Acute vaginitis: Secondary | ICD-10-CM | POA: Diagnosis not present

## 2023-10-28 DIAGNOSIS — Z23 Encounter for immunization: Secondary | ICD-10-CM | POA: Diagnosis present

## 2023-10-28 DIAGNOSIS — K219 Gastro-esophageal reflux disease without esophagitis: Secondary | ICD-10-CM

## 2023-10-28 MED ORDER — PREDNISONE 20 MG PO TABS: 20.0000 mg | ORAL_TABLET | Freq: Every day | ORAL | 0 refills | Status: AC

## 2023-10-28 MED ORDER — PANTOPRAZOLE SODIUM 40 MG PO TBEC
40.0000 mg | DELAYED_RELEASE_TABLET | Freq: Every day | ORAL | 0 refills | Status: DC
Start: 2023-10-28 — End: 2024-01-15

## 2023-10-28 MED ORDER — AZITHROMYCIN 500 MG PO TABS
500.0000 mg | ORAL_TABLET | Freq: Every day | ORAL | 0 refills | Status: AC
Start: 2023-10-28 — End: ?

## 2023-10-28 MED ORDER — FLUCONAZOLE 150 MG PO TABS: 150.0000 mg | ORAL_TABLET | Freq: Once | ORAL | 0 refills | Status: AC

## 2023-10-28 NOTE — Patient Instructions (Addendum)
 It was great to see you today! Thank you for choosing [facility] for your primary care.  Today we addressed: 1. Asthma-COPD overlap syndrome/COPD exacerbation Please use your Dulera  for 2 puffs twice daily, every day. I am sending in 5 more days of steroids (prednisone ), take these once daily. I am also sending 3 days of azithromycin  (an antibiotic).  Take this once daily. Thank you for getting the pneumonia vaccine today.   2. Left thumb cut The goal is to keep your cut dry and clean.  You can use Bandaids to keep it clean.  IF you get it wet, dry it off.  If you get it dirty, you can clean with iodine solution or alcohol.  If the area become hot, swollen, inflamed, please get seen right away.  You should return to our clinic in 1 week for follow up.  Thank you for coming to see us  at [facility] and for the opportunity to care for you! Toma, Delma Drone, MD 10/28/2023, 10:41 AM

## 2023-10-28 NOTE — Assessment & Plan Note (Signed)
 Do not suspect contributing to recurrent cough, patient requests refills. - Refilled pantoprazole  40 mg daily

## 2023-10-28 NOTE — Assessment & Plan Note (Addendum)
 Suspect COPD exacerbation given mild wheeze, productive cough, increased inhaler use.  Saturating well on room air without fevers, no evidence of pneumonia and indication for hospitalization. - Extend prednisone  course with 20 mg daily x5 days - Azithromycin  500 mg x3 days - Instructed on Dulera  use twice daily 2 puffs minimum, additional puffs as needed during exacerbation for shortness of breath - Return precautions if worsening dyspnea, fevers - Sent fluconazole  150 mg x1 dose for BV prevention given history of yeast infections with antibiotic use - Follow-up in 1 week to ensure resolution

## 2023-10-28 NOTE — Progress Notes (Cosign Needed Addendum)
 SUBJECTIVE:   CHIEF COMPLAINT / HPI:  [name] is a 58 y.o. female with a pertinent past medical history of asthma/COPD overlap syndrome, tobacco use disorder and GERD presenting to the clinic for 4 days of persistent productive cough.  Cough Coughing since Saturday, cough is persistent and unrelenting. Productive of yellow to white sputum. Took Mucinex, that did not help. Denies running nose. Does have COPD, last smoked a week ago. Endorses mild dyspnea and has been using inhaler more often, feels like she has to sleep with a fan at night. Has been taking Dulera  only once daily, but has been using her albuterol  inhaler multiple times a day for dyspnea. Has been taking prednisone  40 mg occasionally since 7/11 for hip pain/suspected sciatica from urgent care.  However, has been taking this inconsistently.  L thumb laceration Cut thumb while cooking 3 days ago. Bleeding took a while to stop, but did stop. Has been using alcohol and iodine on it to keep it clean. Has been using Band-Aid to keep it clean when going out.  Smoking cessation Taking Chantix , following with Dr. Koval. Has quit for a week because of her cough. Motivated to stay quit.   PERTINENT PMH / PSH: Asthma/COPD overlap syndrome Tobacco use GERD HTN, HLD Bipolar disorder  *Remainder reviewed in problem list.   OBJECTIVE:   BP 136/85   Pulse 73   Temp 97.9 F (36.6 C)   Wt 245 lb 9.6 oz (111.4 kg)   SpO2 99%   BMI 30.70 kg/m   General: Age-appropriate, resting comfortably in chair, NAD, alert and at baseline. HEENT:  Head: Normocephalic, atraumatic. No tenderness to percussion over sinuses. Eyes: PERRLA. No conjunctival erythema or scleral injections. Nose: Mildly erythematous turbinates.  Mild rhinorrhea. Mouth/Oral: Clear, no tonsillar exudate. MMM. Neck: Supple. No LAD. Cardiovascular: Regular rate and rhythm. Normal S1/S2. No murmurs, rubs, or gallops appreciated. 2+ radial  pulses. Pulmonary: Globally somewhat diminished breath sounds, mild expiratory wheeze heard throughout.  No crackles or rhonchi. Normal WOB on room air. No accessory muscle use. Skin: Left thumb laceration without bleeding with granulation tissue, healing well.  See photo below. Extremities: No peripheral edema bilaterally. Capillary refill <2 seconds.    ASSESSMENT/PLAN:   Assessment & Plan COPD exacerbation (HCC) Asthma-COPD overlap syndrome (HCC) Suspect COPD exacerbation given mild wheeze, productive cough, increased inhaler use.  Saturating well on room air without fevers, no evidence of pneumonia and indication for hospitalization. - Extend prednisone  course with 20 mg daily x5 days - Azithromycin  500 mg x3 days - Instructed on Dulera  use twice daily 2 puffs minimum, additional puffs as needed during exacerbation for shortness of breath - Return precautions if worsening dyspnea, fevers - Sent fluconazole  150 mg x1 dose for BV prevention given history of yeast infections with antibiotic use - Follow-up in 1 week to ensure resolution Encounter for smoking cessation counseling Patient has quit as of approximately 1 week ago. - Congratulated patient, reiterated that this is one of the best that she can do for her COPD - Continue Chantix  1 mg daily - Continue follow-ups with Dr. Koval Encounter for immunization - PCV 20 given, patient is high risk for pneumonia and respiratory complications - Declined Tdap and COVID, reviewed next visit Laceration of left thumb without foreign body without damage to nail, initial encounter Healing well, not bleeding, no signs of infection. - Steri-strip placed - Counseled on keeping area clean and dry, avoiding excess alcohol as infection or keloid Gastroesophageal reflux disease  without esophagitis Do not suspect contributing to recurrent cough, patient requests refills. - Refilled pantoprazole  40 mg daily  Return in about 1 week (around  11/04/2023) for COPD follow-up, health maintenance.  Lyndsay Talamante Toma, MD [facility]

## 2023-10-28 NOTE — Telephone Encounter (Signed)
 Copied from CRM #[id number]. Topic: Clinical - Red Word Triage >> Oct 28, 2023  8:20 AM [name] ORN wrote: Red Word that prompted transfer to Nurse Triage: Patient states she's coughing up yellowish mucus, having cold sweats that's causing her to sweat profusely even with air on & has a fever but has not taken temperature. Patient states she's been taking Mucinex and nothing is working. She states this has been ongoing since Saturday. Wants to see a provider today. Answer Assessment - Initial Assessment Questions 1. ONSET: When did the cough begin?      Saturday       3. SPUTUM: Describe the color of your sputum (e.g., none, dry cough; clear, white, yellow, green)     White/yellow  Patient declines to answer triage questions- just wants appointment- she has disconnected the call.We do not schedule for this office.  Protocols used: Cough - Acute Productive-A-AH

## 2023-10-28 NOTE — Addendum Note (Signed)
 Addended by: TOMA MATAS on: 10/28/2023 12:28 PM   Modules accepted: Orders

## 2023-11-04 ENCOUNTER — Telehealth: Payer: Self-pay | Admitting: Family Medicine

## 2023-11-04 ENCOUNTER — Ambulatory Visit: Admitting: Student

## 2023-11-04 DIAGNOSIS — Z720 Tobacco use: Secondary | ICD-10-CM

## 2023-11-04 DIAGNOSIS — R062 Wheezing: Secondary | ICD-10-CM

## 2023-11-04 DIAGNOSIS — J4489 Other specified chronic obstructive pulmonary disease: Secondary | ICD-10-CM

## 2023-11-04 MED ORDER — ALBUTEROL SULFATE HFA 108 (90 BASE) MCG/ACT IN AERS: 2.0000 | INHALATION_SPRAY | RESPIRATORY_TRACT | 3 refills | Status: AC | PRN

## 2023-11-04 MED ORDER — DULERA 200-5 MCG/ACT IN AERO: 2.0000 | INHALATION_SPRAY | Freq: Two times a day (BID) | RESPIRATORY_TRACT | 6 refills | Status: AC

## 2023-11-04 NOTE — Telephone Encounter (Signed)
 Patient came into the office requesting refill on her inhaler!  Preferred pharmacy [facility], [place]

## 2023-11-04 NOTE — Telephone Encounter (Signed)
 Reordered inhalers as requested per patient.   Kathrine Melena, DO 11/04/23 10:59 AM

## 2023-11-12 ENCOUNTER — Encounter (HOSPITAL_COMMUNITY): Payer: Self-pay

## 2023-11-12 ENCOUNTER — Ambulatory Visit (HOSPITAL_COMMUNITY)
Admission: RE | Admit: 2023-11-12 | Discharge: 2023-11-12 | Disposition: A | Discharge status: Home / Self Care | Payer: Self-pay | Source: Ambulatory Visit | Attending: Family Medicine | Admitting: Family Medicine

## 2023-11-12 VITALS — BP 192/91 | HR 58 | Temp 98.4°F | Resp 20

## 2023-11-12 DIAGNOSIS — I10 Essential (primary) hypertension: Secondary | ICD-10-CM

## 2023-11-12 DIAGNOSIS — G8929 Other chronic pain: Secondary | ICD-10-CM

## 2023-11-12 DIAGNOSIS — M5442 Lumbago with sciatica, left side: Secondary | ICD-10-CM

## 2023-11-12 MED ORDER — HYDROCODONE-ACETAMINOPHEN 5-325 MG PO TABS
ORAL_TABLET | ORAL | Status: AC
  Filled 2023-11-12: qty 1

## 2023-11-12 MED ORDER — HYDROCODONE-ACETAMINOPHEN 5-325 MG PO TABS: 1.0000 | ORAL_TABLET | Freq: Four times a day (QID) | ORAL | 0 refills | Status: AC | PRN

## 2023-11-12 MED ORDER — HYDROCODONE-ACETAMINOPHEN 5-325 MG PO TABS
1.0000 | ORAL_TABLET | Freq: Once | ORAL | Status: AC
  Administered 2023-11-12: 1 via ORAL

## 2023-11-12 NOTE — ED Triage Notes (Signed)
 Pt reports was seen here 7/11after fall. Pt reports when got up couple days ago having back pain that radiates down her left leg down to her left knee. Denies any new injury, lifting etc. Reports has swollen area on back and fever. Reports can't sleep over the past two days due to pain. Using heating pad which helps make feel better.

## 2023-11-12 NOTE — Discharge Instructions (Signed)
 Be aware, you have been prescribed pain medications that may cause drowsiness. While taking this medication, do not take any other medications containing acetaminophen  (Tylenol ). Do not combine with alcohol or recreational drugs. Please do not drive, operate heavy machinery, or take part in activities that require making important decisions while on this medication as your judgement may be clouded.  Your blood pressure was noted to be elevated during your visit today. If you are currently taking medication for high blood pressure, please ensure you are taking this as directed. If you do not have a history of high blood pressure and your blood pressure remains persistently elevated, you may need to begin taking a medication at some point. You may return here within the next few days to recheck if unable to see your primary care provider or if you do not have a one.  BP (!) 192/91 (BP Location: Right Arm)   Pulse (!) 58   Temp 98.4 F (36.9 C) (Oral)   Resp 20   LMP  (Approximate)   SpO2 98%   BP Readings from Last 3 Encounters:  11/12/23 (!) 192/91  10/28/23 136/85  10/08/23 (!) 159/89

## 2023-11-12 NOTE — ED Provider Notes (Signed)
 [facility]   [id number] 11/12/23 Arrival Time: 1616  ASSESSMENT & PLAN:  1. Chronic left-sided low back pain with left-sided sciatica   2. Elevated blood pressure reading in office with diagnosis of hypertension   Acute exacerbation.  May need MRI; discussed. Able to ambulate here and hemodynamically stable.  Meds ordered this encounter  Medications   HYDROcodone -acetaminophen  (NORCO/VICODIN) 5-325 MG per tablet 1 tablet    Refill:  0   HYDROcodone -acetaminophen  (NORCO/VICODIN) 5-325 MG tablet    Sig: Take 1 tablet by mouth every 6 (six) hours as needed for moderate pain (pain score 4-6) or severe pain (pain score 7-10).    Dispense:  12 tablet    Refill:  0   Medication sedation precautions given. Encourage ROM/movement as tolerated.  Recommend:  Follow-up Information     Schedule an appointment as soon as possible for a visit  with Toma, Dimitry, MD.   Specialty: Family Medicine Why: To discuss your low back pain and to recheck your blood pressure. Contact information: [address] [place] [address] [phone]         [facility] Emergency Department at [facility].   Specialty: Emergency Medicine Why: If symptoms worsen in any way. Contact information: [address] [place]  [address] [phone]                 Discharge Instructions      Be aware, you have been prescribed pain medications that may cause drowsiness. While taking this medication, do not take any other medications containing acetaminophen  (Tylenol ). Do not combine with alcohol or recreational drugs. Please do not drive, operate heavy machinery, or take part in activities that require making important decisions while on this medication as your judgement may be clouded.  Your blood pressure was noted to be elevated during your visit today. If you are currently taking medication for high blood pressure, please ensure you are taking  this as directed. If you do not have a history of high blood pressure and your blood pressure remains persistently elevated, you may need to begin taking a medication at some point. You may return here within the next few days to recheck if unable to see your primary care provider or if you do not have a one.  BP (!) 192/91 (BP Location: Right Arm)   Pulse (!) 58   Temp 98.4 F (36.9 C) (Oral)   Resp 20   LMP  (Approximate)   SpO2 98%   BP Readings from Last 3 Encounters:  11/12/23 (!) 192/91  10/28/23 136/85  10/08/23 (!) 159/89         Reviewed expectations re: course of current medical issues. Questions answered. Outlined signs and symptoms indicating need for more acute intervention. Patient verbalized understanding. After Visit Summary given.   SUBJECTIVE: History from: patient.  [name] is a 58 y.o. female who presents with complaint of fairly persistent acute on chronic LBP; with L sciatica. Pt reports was seen here 7/11after fall. Pt reports when got up couple days ago having back pain that radiates down her left leg down to her left knee. Denies any new injury, lifting etc. Reports has swollen area on back and fever. Reports can't sleep over the past two days due to pain. Using heating pad which helps make feel better.  Normal bowel/bladder habits.   OBJECTIVE:  Vitals:   11/12/23 1701  BP: (!) 192/91  Pulse: (!) 58  Resp: 20  Temp:  98.4 F (36.9 C)  TempSrc: Oral  SpO2: 98%    General appearance: alert; no distress HEENT: [place]; AT3 Back vague but significant pain described with exam/palpation of L lower paraspinal lumbar musculature extending over SI joint; FROM at waist; bruising: none; without midline tenderness Extremities: symmetrical without gross deformities; normal ROM of bilateral LE Skin: warm and dry Neurologic: normal gait; normal sensation and strength of bilateral LE Psychological: alert and cooperative; normal mood and  affect  Labs:  Labs Reviewed - No data to display  Imaging: No results found.  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  Last Assessment & Plan:  Relevant Hx: Course: Daily Update: Today's Plan:    Congestion of nasal sinus 08/03/2018   Depression    ECZEMA, ATOPIC DERMATITIS 08/25/2007   Qualifier: Diagnosis of  By: Xavier MD, Mark     GERD (gastroesophageal reflux disease)    Hypopigmented skin lesion 06/22/2019   Impingement syndrome of right shoulder 04/16/2019   IRREGULAR MENSES 11/03/2007   Qualifier: Diagnosis of  By: Rosalynn MD, Camie     LOW BACK PAIN, CHRONIC 06/30/2006   Qualifier: Diagnosis of  By: Xavier MD, Mark     Osteoarthritis of knee 03/05/2012   OVARIAN CYST 08/12/2007   Qualifier: Diagnosis of  By: Xavier MD, Mark     Panic attacks    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   TOBACCO ABUSE 04/13/2008   VARICOSE VEINS, LOWER EXTREMITIES 06/30/2006   Qualifier: Diagnosis of  By: Xavier MD, Oneil     Social History   Socioeconomic History   Marital status: Married    Spouse name: Not on file   Number of children: Not on file   Years of education: Not on file   Highest education level: Not on file  Occupational History   Not on file  Tobacco Use   Smoking status: Former    Current packs/day: 0.00    Average packs/day: 0.5 packs/day for 42.6 years (21.3 ttl pk-yrs)    Types: Cigarettes    Start date: 03/25/1981    Quit date: 10/23/2023    Years since quitting: 0.0    Passive exposure: Current   Smokeless tobacco: Never  Vaping Use   Vaping status: Never Used  Substance and Sexual Activity    Alcohol use: Yes   Drug use: Yes    Types: Marijuana   Sexual activity: Yes    Birth control/protection: None  Other Topics Concern   Not on file  Social History Narrative   3 children (2 daughters and 1 son). Engaged, not working she is disabled since 2009 (anxiety, Bipolar). Finished 11th grade.    Social Drivers of Corporate investment banker Strain: Not on file  Food Insecurity: Not on file  Transportation Needs: Not on file  Physical Activity: Not on file  Stress: Not on file  Social Connections: Not on file  Intimate Partner Violence: Not on file   Family History  Problem Relation Age of Onset   Hypertension Mother    Diabetes Mother    Diabetes  Father    Hypertension Father    Past Surgical History:  Procedure Laterality Date   KNEE CARTILAGE SURGERY  2013   right       Rolinda Rogue, MD 11/15/23 1002

## 2023-11-17 ENCOUNTER — Telehealth: Payer: Self-pay | Admitting: Pharmacist

## 2023-11-17 NOTE — Telephone Encounter (Signed)
 Patient called back following VM earlier. Endorses 20-days smoke-free and reports her breathing is fine. Congratulated patient on success. Reports she is unhappy with weight gain since quitting and is having back pain.  Discussed increasing physical activity when back pain subsides. Advised dietary changes including increasing fiber intake, increasing water intake, changing to non-calorie drinks, and reducing calorie-dense comfort foods available in the home.  Reports 10/10 confidence that she will still be smoke-free at the end of September.  Total time with patient call and documentation of interaction: 9 minutes.

## 2023-11-17 NOTE — Telephone Encounter (Signed)
 Patient contacted for follow-up of breathing difficulty earlier this month and tobacco cessation.  Patient unavailable, left VM and provided return phone number.  Total time with patient call and documentation of interaction: 3 minutes.  F/U Phone call planned: 2 weeks

## 2023-11-18 ENCOUNTER — Telehealth: Payer: Self-pay

## 2023-11-18 ENCOUNTER — Ambulatory Visit: Admitting: Family Medicine

## 2023-11-18 NOTE — Telephone Encounter (Signed)
 Patient presents to [facility] requesting a refill on hydrocodone .   FO staff rescheduled patient for PCP next available which is 9/2. Patients apt was cancelled today with PCP.   She reports she was seen at [facility] on 8/20 was given initial prescription. She reports she planned to followup with PCP today as scheduled.   Advised will forward to PCP. However, more than likely will need an OV for narcotic refill.

## 2023-11-18 NOTE — Telephone Encounter (Signed)
 Patient LVM on nurse line regarding previous message.   Please advise.   [name] English, RN

## 2023-11-18 NOTE — Telephone Encounter (Signed)
 Reviewed and agree with Dr Rennis plan.

## 2023-11-20 NOTE — Telephone Encounter (Signed)
 Called patient. Advised of need for appointment. Patient states that she will call office back to schedule as she is not feeling well right now and would like to rest.   [name] English, RN

## 2023-11-25 ENCOUNTER — Ambulatory Visit (INDEPENDENT_AMBULATORY_CARE_PROVIDER_SITE_OTHER): Admitting: Family Medicine

## 2023-11-25 ENCOUNTER — Encounter: Payer: Self-pay | Admitting: Family Medicine

## 2023-11-25 VITALS — BP 135/85 | HR 72 | Ht 75.0 in | Wt 250.0 lb

## 2023-11-25 DIAGNOSIS — Z23 Encounter for immunization: Secondary | ICD-10-CM

## 2023-11-25 DIAGNOSIS — M5440 Lumbago with sciatica, unspecified side: Secondary | ICD-10-CM

## 2023-11-25 DIAGNOSIS — M5416 Radiculopathy, lumbar region: Secondary | ICD-10-CM | POA: Diagnosis not present

## 2023-11-25 DIAGNOSIS — Z1211 Encounter for screening for malignant neoplasm of colon: Secondary | ICD-10-CM | POA: Diagnosis not present

## 2023-11-25 DIAGNOSIS — Z1231 Encounter for screening mammogram for malignant neoplasm of breast: Secondary | ICD-10-CM

## 2023-11-25 MED ORDER — DICLOFENAC SODIUM 1 % EX GEL: 4.0000 g | Freq: Four times a day (QID) | CUTANEOUS | 0 refills | Status: AC

## 2023-11-25 MED ORDER — TRAMADOL HCL 50 MG PO TABS: 50.0000 mg | ORAL_TABLET | Freq: Three times a day (TID) | ORAL | 0 refills | Status: AC | PRN

## 2023-11-25 NOTE — Patient Instructions (Addendum)
 It was great to see you today! Thank you for choosing [facility] for your primary care.  Today we addressed: 1. Colon cancer screening They will call you about scheduling your colonoscopy.  2. Screening mammogram for breast cancer You need a mammogram to prevent breast cancer.  Please schedule an appointment.  You can call [phone] to do so.  3. Back pain of lumbar region with sciatica We will call you about the CT scan once insurance approves it I have placed a referral to a spine specialist urgently.  They will call you to set up an appointment. Please give us  a call if you don't hear back in the next 1 week. If you have numbness/weakness worsening in a leg, cannot hold your urine or stool, or cannot walk, please call EMS right away. I am sending in a week of Tramadol , which I will be able to refill in the future, but this is not a medicine I can prescribe chronically as it has too many side effects.  You should return to our clinic in 1 month for follow up.  Thank you for coming to see us  at [facility] and for the opportunity to care for you! Toma, Taden Witter, MD 11/25/2023, 11:16 AM

## 2023-11-25 NOTE — Progress Notes (Signed)
   SUBJECTIVE:   CHIEF COMPLAINT / HPI:  [name] is a 58 y.o. female with a pertinent past medical history of asthma/COPD overlap syndrome, tobacco use disorder, GERD, and chronic lumbar pain presenting to the clinic for follow up of COPD and chronic lumbar pain.  COPD exacerbation Diagnosed 8/5 and treated with prednisone , azithromycin  as well as counseling on Dulera  use. Refilled inhalers on 8/12. Today, patient reports improvement of symptoms, no further dyspnea. Agrees to influenza vaccine.  Smoking cessation 20 pack-year history, due for lung cancer screening but deferred today. Patient reported quit as of 8/5, as well as on follow up call with Dr. Koval on 8/25. Today, patient endorses continuing success with quitting and has not smoked since quit date. Endorses 10/10 confidence she will continue to avoid smoking.  Chronic lumbar pain with sciatica Patient seen in UC on 8/20 for this issue. Prescribed 12 tablets of hydrocodone -acetaminophen  5-325 mg. Today, patient has been doing worse. [name] for the first time on July 10th and again on Friday. Patient's pain has been 10/10 on left side. Describes as a sharp, tingling, lightning like pain that travels down to left knee. Cannot walk long duration, has to stop and rest after a few steps. Has to sit on stool to take a shower. Has to sit down on a barstool to wash dishes and to cook. Patient was taking 4-5 ibuprofen  pills at a time for the pain. Taking gabapentin  600 mg TID with only brief relief. Patient is very claustrophobic, refuses to go into MRI, including with Valium . Patient declines physical therapy.   PERTINENT PMH / PSH: Asthma/COPD overlap syndrome Tobacco use GERD HTN, HLD Bipolar disorder Chronic lumbar pain  *Remainder reviewed in problem list.   OBJECTIVE:   BP 135/85   Pulse 72   Ht 6' 3 (1.905 m)   Wt 250 lb (113.4 kg)   SpO2 99%   BMI 31.25 kg/m   General: Age-appropriate,  uncomfortable sitting in chair, NAD, alert and at baseline. Cardiovascular: Regular rate and rhythm. Normal S1/S2. No murmurs, rubs, or gallops appreciated. 2+ radial pulses. Pulmonary: Normal WOB on RA. Skin: Warm and dry. Extremities: No peripheral edema bilaterally. Capillary refill <2 seconds. MSK: Positive straight-leg raise on L.  L-sided TTP over L greater trochanter but no edema noted. Antalgic gait with small short steps, unsteady.  Limited abduction of bilateral hips due to pain. 5/5 strength in bilateral knees and hips.   ASSESSMENT/PLAN:   Assessment & Plan Back pain of lumbar region with sciatica Lumbar radiculopathy Physical exam and symptoms consistent with sciatica.  Patient has prior significant spinal degeneration noted on CT imaging in 2022, which is consistent with current findings with likely development of sciatica in the interval.  No evidence of cauda equina syndrome or other spinal compression, but given severity of patient's symptoms favor neurosurgery referral. - Tramadol  50 mg TID PRN x1 week, with consideration for refills depending on imaging findings and Neurosurgery follow up - Urgent amb ref to Neurosurgery - Urgent CT spine WO, MRI declined due to claustrophobia Screening mammogram for breast cancer - Referral placed and breast imaging center phone number with instructions provided Colon cancer screening - Amb ref to GI Encounter for immunization - Influenza vaccine provided  Follow up in 1 month.  Cathy Altemus Toma, MD [facility]

## 2023-11-28 ENCOUNTER — Telehealth: Payer: Self-pay | Admitting: Pharmacist

## 2023-11-28 NOTE — Telephone Encounter (Signed)
 Attempted to contact patient for follow-up of tobacco cessation   Unable to reach -mailbox full.   Total time with patient call and documentation of interaction: 2 minutes.

## 2023-11-28 NOTE — Telephone Encounter (Signed)
-----   Message from Maude Lagos sent at 10/16/2023  3:52 PM EDT ----- Regarding: FW: Tobaco Cessation Relapsed in 09-15-23 due to death of brother  - smoking 2 per day 7/24 ----- Message ----- From: Lagos Maude MATSU, RPH-CPP Sent: 10/13/2023  12:00 AM EDT To: Maude MATSU Lagos, RPH-CPP Subject: Tobaco Cessation long-term quit?               Quit January - 3rd week 2025  Still quit?

## 2023-12-02 ENCOUNTER — Telehealth: Payer: Self-pay | Admitting: Family Medicine

## 2023-12-02 NOTE — Telephone Encounter (Signed)
 Received request for attestation of medical need and independent assessment for personal care services under Medicaid for patient on 12/01/2023.  Filled out form based on lumbar radiculopathy and lumbar pain with left sciatica, uploaded photos to media tab.  Placing in medical records box of front office, will need visit note from 11/25/2023 attached.  Did attempt to contact patient about this form completion, but patient did not respond to call and voice mailbox was full.

## 2023-12-23 ENCOUNTER — Telehealth: Payer: Self-pay | Admitting: Pharmacist

## 2023-12-23 NOTE — Telephone Encounter (Signed)
 Reviewed and agree with Dr Rennis plan.

## 2023-12-23 NOTE — Telephone Encounter (Signed)
 Attempted to contact patient for follow-up of tobacco intake cessation. Voicemail box full & unable to leave message.   Total time with patient call and documentation of interaction: 2 minutes.

## 2023-12-25 ENCOUNTER — Telehealth: Payer: Self-pay | Admitting: Pharmacist

## 2023-12-25 NOTE — Telephone Encounter (Signed)
 Attempted to contact patient for follow-up of tobacco cessation.     Phone number did not connect.    Please refer back to pharmacy clinic for assistance in future if needed.    Total time with patient call and documentation of interaction: 3 minutes.  Follow-up phone call planned: None planned.

## 2024-01-15 ENCOUNTER — Other Ambulatory Visit: Payer: Self-pay | Admitting: Family Medicine

## 2024-01-15 DIAGNOSIS — K219 Gastro-esophageal reflux disease without esophagitis: Secondary | ICD-10-CM

## 2024-02-11 ENCOUNTER — Encounter: Payer: Self-pay | Admitting: Internal Medicine
